# Patient Record
Sex: Female | Born: 1945 | Race: Black or African American | Hispanic: No | Smoking: Current every day smoker
Health system: Southern US, Community
[De-identification: ages and names within clinical notes are randomized; demographics above are authoritative.]

## PROBLEM LIST (undated history)

## (undated) DIAGNOSIS — Z720 Tobacco use: Secondary | ICD-10-CM

## (undated) DIAGNOSIS — F1010 Alcohol abuse, uncomplicated: Secondary | ICD-10-CM

## (undated) DIAGNOSIS — C50919 Malignant neoplasm of unspecified site of unspecified female breast: Secondary | ICD-10-CM

## (undated) DIAGNOSIS — I10 Essential (primary) hypertension: Secondary | ICD-10-CM

## (undated) DIAGNOSIS — J449 Chronic obstructive pulmonary disease, unspecified: Secondary | ICD-10-CM

## (undated) HISTORY — PX: MASTECTOMY: SHX3

---

## 1999-12-14 ENCOUNTER — Emergency Department (HOSPITAL_COMMUNITY): Admission: EM | Admit: 1999-12-14 | Discharge: 1999-12-14 | Payer: Self-pay | Admitting: Emergency Medicine

## 2002-04-09 ENCOUNTER — Emergency Department (HOSPITAL_COMMUNITY): Admission: EM | Admit: 2002-04-09 | Discharge: 2002-04-09 | Payer: Self-pay | Admitting: Emergency Medicine

## 2004-03-05 ENCOUNTER — Ambulatory Visit: Payer: Self-pay | Admitting: Family Medicine

## 2004-03-07 ENCOUNTER — Ambulatory Visit (HOSPITAL_COMMUNITY): Admission: RE | Admit: 2004-03-07 | Discharge: 2004-03-07 | Payer: Self-pay | Admitting: Sports Medicine

## 2004-03-19 ENCOUNTER — Ambulatory Visit: Payer: Self-pay | Admitting: Family Medicine

## 2004-06-11 ENCOUNTER — Encounter: Admission: RE | Admit: 2004-06-11 | Discharge: 2004-06-11 | Payer: Self-pay | Admitting: Gastroenterology

## 2004-06-24 ENCOUNTER — Encounter (INDEPENDENT_AMBULATORY_CARE_PROVIDER_SITE_OTHER): Payer: Self-pay | Admitting: Specialist

## 2004-06-24 ENCOUNTER — Ambulatory Visit (HOSPITAL_COMMUNITY): Admission: RE | Admit: 2004-06-24 | Discharge: 2004-06-24 | Payer: Self-pay | Admitting: Gastroenterology

## 2004-08-27 ENCOUNTER — Observation Stay (HOSPITAL_COMMUNITY): Admission: RE | Admit: 2004-08-27 | Discharge: 2004-08-28 | Payer: Self-pay | Admitting: General Surgery

## 2004-08-27 ENCOUNTER — Encounter (INDEPENDENT_AMBULATORY_CARE_PROVIDER_SITE_OTHER): Payer: Self-pay | Admitting: *Deleted

## 2004-08-28 ENCOUNTER — Emergency Department (HOSPITAL_COMMUNITY): Admission: EM | Admit: 2004-08-28 | Discharge: 2004-08-29 | Payer: Self-pay | Admitting: Emergency Medicine

## 2005-02-12 ENCOUNTER — Other Ambulatory Visit: Admission: RE | Admit: 2005-02-12 | Discharge: 2005-02-12 | Payer: Self-pay | Admitting: Radiology

## 2005-03-04 ENCOUNTER — Encounter (INDEPENDENT_AMBULATORY_CARE_PROVIDER_SITE_OTHER): Payer: Self-pay | Admitting: Specialist

## 2005-03-04 ENCOUNTER — Ambulatory Visit (HOSPITAL_COMMUNITY): Admission: RE | Admit: 2005-03-04 | Discharge: 2005-03-05 | Payer: Self-pay | Admitting: General Surgery

## 2005-03-11 ENCOUNTER — Ambulatory Visit: Payer: Self-pay | Admitting: Oncology

## 2005-04-03 ENCOUNTER — Ambulatory Visit (HOSPITAL_COMMUNITY): Admission: RE | Admit: 2005-04-03 | Discharge: 2005-04-03 | Payer: Self-pay | Admitting: Oncology

## 2005-05-21 ENCOUNTER — Ambulatory Visit: Payer: Self-pay | Admitting: Oncology

## 2005-07-14 ENCOUNTER — Ambulatory Visit: Payer: Self-pay | Admitting: Oncology

## 2005-08-25 LAB — CBC WITH DIFFERENTIAL/PLATELET
BASO%: 0.7 % (ref 0.0–2.0)
Basophils Absolute: 0.1 10*3/uL (ref 0.0–0.1)
EOS%: 0.6 % (ref 0.0–7.0)
HGB: 11.6 g/dL (ref 11.6–15.9)
MCH: 30.1 pg (ref 26.0–34.0)
MONO#: 1 10*3/uL — ABNORMAL HIGH (ref 0.1–0.9)
RDW: 16.9 % — ABNORMAL HIGH (ref 11.3–14.5)
WBC: 9.8 10*3/uL (ref 3.9–10.0)
lymph#: 3.3 10*3/uL (ref 0.9–3.3)

## 2005-08-25 LAB — COMPREHENSIVE METABOLIC PANEL
ALT: 12 U/L (ref 0–40)
BUN: 7 mg/dL (ref 6–23)
CO2: 24 mEq/L (ref 19–32)
Calcium: 9.2 mg/dL (ref 8.4–10.5)
Chloride: 102 mEq/L (ref 96–112)
Creatinine, Ser: 0.6 mg/dL (ref 0.4–1.2)
Glucose, Bld: 85 mg/dL (ref 70–99)

## 2005-08-25 LAB — CANCER ANTIGEN 27.29: CA 27.29: 34 U/mL (ref 0–39)

## 2005-08-31 ENCOUNTER — Ambulatory Visit: Payer: Self-pay | Admitting: Oncology

## 2005-09-01 LAB — CBC WITH DIFFERENTIAL/PLATELET
Basophils Absolute: 0.1 10*3/uL (ref 0.0–0.1)
Eosinophils Absolute: 0 10*3/uL (ref 0.0–0.5)
HGB: 11.2 g/dL — ABNORMAL LOW (ref 11.6–15.9)
MCV: 91.4 fL (ref 81.0–101.0)
NEUT#: 10.3 10*3/uL — ABNORMAL HIGH (ref 1.5–6.5)
RDW: 16.9 % — ABNORMAL HIGH (ref 11.3–14.5)
lymph#: 4.2 10*3/uL — ABNORMAL HIGH (ref 0.9–3.3)

## 2005-09-16 LAB — COMPREHENSIVE METABOLIC PANEL
ALT: 13 U/L (ref 0–40)
Albumin: 3.6 g/dL (ref 3.5–5.2)
CO2: 24 mEq/L (ref 19–32)
Calcium: 9.2 mg/dL (ref 8.4–10.5)
Chloride: 104 mEq/L (ref 96–112)
Glucose, Bld: 82 mg/dL (ref 70–99)
Potassium: 4 mEq/L (ref 3.5–5.3)
Sodium: 138 mEq/L (ref 135–145)
Total Bilirubin: 0.4 mg/dL (ref 0.3–1.2)
Total Protein: 8.1 g/dL (ref 6.0–8.3)

## 2005-09-16 LAB — CBC WITH DIFFERENTIAL/PLATELET
BASO%: 1.2 % (ref 0.0–2.0)
Eosinophils Absolute: 0.1 10*3/uL (ref 0.0–0.5)
LYMPH%: 38.2 % (ref 14.0–48.0)
MONO#: 0.6 10*3/uL (ref 0.1–0.9)
NEUT#: 3.2 10*3/uL (ref 1.5–6.5)
Platelets: 380 10*3/uL (ref 145–400)
RBC: 3.83 10*6/uL (ref 3.70–5.32)
WBC: 6.2 10*3/uL (ref 3.9–10.0)
lymph#: 2.4 10*3/uL (ref 0.9–3.3)

## 2005-09-16 LAB — CANCER ANTIGEN 27.29: CA 27.29: 30 U/mL (ref 0–39)

## 2005-09-22 LAB — CBC WITH DIFFERENTIAL/PLATELET
Basophils Absolute: 0 10*3/uL (ref 0.0–0.1)
Eosinophils Absolute: 0 10*3/uL (ref 0.0–0.5)
HCT: 34.5 % — ABNORMAL LOW (ref 34.8–46.6)
HGB: 11.4 g/dL — ABNORMAL LOW (ref 11.6–15.9)
MONO#: 0.4 10*3/uL (ref 0.1–0.9)
NEUT%: 73.3 % (ref 39.6–76.8)
Platelets: 327 10*3/uL (ref 145–400)
WBC: 13.6 10*3/uL — ABNORMAL HIGH (ref 3.9–10.0)
lymph#: 3.1 10*3/uL (ref 0.9–3.3)

## 2005-10-26 ENCOUNTER — Ambulatory Visit: Payer: Self-pay | Admitting: Oncology

## 2005-10-28 LAB — CBC WITH DIFFERENTIAL/PLATELET
Basophils Absolute: 0 10*3/uL (ref 0.0–0.1)
Eosinophils Absolute: 0.4 10*3/uL (ref 0.0–0.5)
HCT: 35.9 % (ref 34.8–46.6)
HGB: 11.6 g/dL (ref 11.6–15.9)
LYMPH%: 47.9 % (ref 14.0–48.0)
MCV: 91.2 fL (ref 81.0–101.0)
MONO%: 5.7 % (ref 0.0–13.0)
NEUT#: 2.6 10*3/uL (ref 1.5–6.5)
Platelets: 270 10*3/uL (ref 145–400)

## 2005-10-28 LAB — COMPREHENSIVE METABOLIC PANEL
Albumin: 3.7 g/dL (ref 3.5–5.2)
Alkaline Phosphatase: 297 U/L — ABNORMAL HIGH (ref 39–117)
BUN: 11 mg/dL (ref 6–23)
CO2: 23 mEq/L (ref 19–32)
Glucose, Bld: 81 mg/dL (ref 70–99)
Total Bilirubin: 0.4 mg/dL (ref 0.3–1.2)

## 2005-10-28 LAB — CANCER ANTIGEN 27.29: CA 27.29: 17 U/mL (ref 0–39)

## 2006-03-02 ENCOUNTER — Encounter: Payer: Self-pay | Admitting: Family Medicine

## 2006-04-26 ENCOUNTER — Ambulatory Visit: Payer: Self-pay | Admitting: Oncology

## 2006-04-28 LAB — COMPREHENSIVE METABOLIC PANEL
ALT: 13 U/L (ref 0–35)
AST: 21 U/L (ref 0–37)
CO2: 23 mEq/L (ref 19–32)
Creatinine, Ser: 0.6 mg/dL (ref 0.40–1.20)
Total Bilirubin: 0.6 mg/dL (ref 0.3–1.2)

## 2006-04-28 LAB — CBC WITH DIFFERENTIAL/PLATELET
BASO%: 1.1 % (ref 0.0–2.0)
EOS%: 4 % (ref 0.0–7.0)
HCT: 41.4 % (ref 34.8–46.6)
LYMPH%: 50.7 % — ABNORMAL HIGH (ref 14.0–48.0)
MCH: 34.6 pg — ABNORMAL HIGH (ref 26.0–34.0)
MCHC: 33.6 g/dL (ref 32.0–36.0)
NEUT%: 39.5 % — ABNORMAL LOW (ref 39.6–76.8)
Platelets: 295 10*3/uL (ref 145–400)

## 2006-04-28 LAB — CANCER ANTIGEN 27.29: CA 27.29: 14 U/mL (ref 0–39)

## 2006-07-15 DIAGNOSIS — E785 Hyperlipidemia, unspecified: Secondary | ICD-10-CM | POA: Insufficient documentation

## 2006-07-15 DIAGNOSIS — R638 Other symptoms and signs concerning food and fluid intake: Secondary | ICD-10-CM | POA: Insufficient documentation

## 2006-07-15 DIAGNOSIS — R112 Nausea with vomiting, unspecified: Secondary | ICD-10-CM

## 2006-07-15 DIAGNOSIS — I10 Essential (primary) hypertension: Secondary | ICD-10-CM | POA: Insufficient documentation

## 2006-07-15 DIAGNOSIS — K7460 Unspecified cirrhosis of liver: Secondary | ICD-10-CM | POA: Insufficient documentation

## 2006-07-26 ENCOUNTER — Ambulatory Visit: Payer: Self-pay | Admitting: Oncology

## 2006-07-28 LAB — CBC WITH DIFFERENTIAL/PLATELET
EOS%: 4.8 % (ref 0.0–7.0)
Eosinophils Absolute: 0.4 10*3/uL (ref 0.0–0.5)
LYMPH%: 41.5 % (ref 14.0–48.0)
MCH: 35.5 pg — ABNORMAL HIGH (ref 26.0–34.0)
MCV: 101.8 fL — ABNORMAL HIGH (ref 81.0–101.0)
MONO%: 5.2 % (ref 0.0–13.0)
NEUT#: 4.1 10*3/uL (ref 1.5–6.5)
Platelets: 311 10*3/uL (ref 145–400)
RBC: 4.22 10*6/uL (ref 3.70–5.32)
RDW: 13.7 % (ref 11.3–14.5)

## 2006-07-30 LAB — COMPREHENSIVE METABOLIC PANEL
AST: 20 U/L (ref 0–37)
Alkaline Phosphatase: 286 U/L — ABNORMAL HIGH (ref 39–117)
BUN: 8 mg/dL (ref 6–23)
Glucose, Bld: 85 mg/dL (ref 70–99)
Potassium: 4.6 mEq/L (ref 3.5–5.3)
Sodium: 137 mEq/L (ref 135–145)
Total Bilirubin: 0.3 mg/dL (ref 0.3–1.2)
Total Protein: 8.9 g/dL — ABNORMAL HIGH (ref 6.0–8.3)

## 2006-07-30 LAB — IMMUNOFIXATION ELECTROPHORESIS
IgG (Immunoglobin G), Serum: 2250 mg/dL — ABNORMAL HIGH (ref 694–1618)
IgM, Serum: 293 mg/dL — ABNORMAL HIGH (ref 60–263)
Total Protein, Serum Electrophoresis: 8.9 g/dL — ABNORMAL HIGH (ref 6.0–8.3)

## 2006-07-30 LAB — FOLATE: Folate: 9.7 ng/mL

## 2006-08-29 ENCOUNTER — Emergency Department (HOSPITAL_COMMUNITY): Admission: EM | Admit: 2006-08-29 | Discharge: 2006-08-30 | Payer: Self-pay | Admitting: Emergency Medicine

## 2006-08-31 ENCOUNTER — Emergency Department (HOSPITAL_COMMUNITY): Admission: EM | Admit: 2006-08-31 | Discharge: 2006-08-31 | Payer: Self-pay | Admitting: Emergency Medicine

## 2006-11-01 ENCOUNTER — Ambulatory Visit: Payer: Self-pay | Admitting: Oncology

## 2006-11-03 LAB — CBC WITH DIFFERENTIAL/PLATELET
BASO%: 0.5 % (ref 0.0–2.0)
EOS%: 3 % (ref 0.0–7.0)
MCH: 35.8 pg — ABNORMAL HIGH (ref 26.0–34.0)
MCV: 101.4 fL — ABNORMAL HIGH (ref 81.0–101.0)
MONO%: 4.5 % (ref 0.0–13.0)
RBC: 3.67 10*6/uL — ABNORMAL LOW (ref 3.70–5.32)
RDW: 13.4 % (ref 11.3–14.5)
lymph#: 4.6 10*3/uL — ABNORMAL HIGH (ref 0.9–3.3)

## 2006-11-03 LAB — COMPREHENSIVE METABOLIC PANEL
ALT: 11 U/L (ref 0–35)
AST: 21 U/L (ref 0–37)
CO2: 20 mEq/L (ref 19–32)
Creatinine, Ser: 0.72 mg/dL (ref 0.40–1.20)
Total Bilirubin: 0.3 mg/dL (ref 0.3–1.2)

## 2006-11-03 LAB — CANCER ANTIGEN 27.29: CA 27.29: 18 U/mL (ref 0–39)

## 2007-01-31 ENCOUNTER — Ambulatory Visit: Payer: Self-pay | Admitting: Oncology

## 2007-03-03 ENCOUNTER — Encounter: Payer: Self-pay | Admitting: Family Medicine

## 2007-03-11 ENCOUNTER — Ambulatory Visit: Payer: Self-pay | Admitting: Oncology

## 2007-03-15 ENCOUNTER — Encounter (INDEPENDENT_AMBULATORY_CARE_PROVIDER_SITE_OTHER): Payer: Self-pay | Admitting: Family Medicine

## 2007-03-15 LAB — CBC & DIFF AND RETIC
Basophils Absolute: 0.1 10*3/uL (ref 0.0–0.1)
EOS%: 7.4 % — ABNORMAL HIGH (ref 0.0–7.0)
Eosinophils Absolute: 0.5 10*3/uL (ref 0.0–0.5)
HCT: 42.5 % (ref 34.8–46.6)
HGB: 14.9 g/dL (ref 11.6–15.9)
IRF: 0.43 — ABNORMAL HIGH (ref 0.130–0.330)
MCH: 36.4 pg — ABNORMAL HIGH (ref 26.0–34.0)
NEUT%: 41.4 % (ref 39.6–76.8)
lymph#: 2.7 10*3/uL (ref 0.9–3.3)

## 2007-03-15 LAB — COMPREHENSIVE METABOLIC PANEL
ALT: 14 U/L (ref 0–35)
Albumin: 3.7 g/dL (ref 3.5–5.2)
CO2: 22 mEq/L (ref 19–32)
Calcium: 8.9 mg/dL (ref 8.4–10.5)
Chloride: 105 mEq/L (ref 96–112)
Creatinine, Ser: 0.79 mg/dL (ref 0.40–1.20)
Potassium: 4.2 mEq/L (ref 3.5–5.3)
Sodium: 139 mEq/L (ref 135–145)
Total Protein: 8.2 g/dL (ref 6.0–8.3)

## 2007-03-15 LAB — CANCER ANTIGEN 27.29: CA 27.29: 14 U/mL (ref 0–39)

## 2007-09-01 ENCOUNTER — Ambulatory Visit: Payer: Self-pay | Admitting: Oncology

## 2007-09-06 LAB — COMPREHENSIVE METABOLIC PANEL
ALT: 20 U/L (ref 0–35)
AST: 34 U/L (ref 0–37)
CO2: 22 mEq/L (ref 19–32)
Sodium: 134 mEq/L — ABNORMAL LOW (ref 135–145)
Total Bilirubin: 0.6 mg/dL (ref 0.3–1.2)
Total Protein: 9.3 g/dL — ABNORMAL HIGH (ref 6.0–8.3)

## 2007-09-06 LAB — CBC WITH DIFFERENTIAL/PLATELET
BASO%: 0.9 % (ref 0.0–2.0)
LYMPH%: 33.3 % (ref 14.0–48.0)
MCHC: 35.4 g/dL (ref 32.0–36.0)
MONO#: 0.2 10*3/uL (ref 0.1–0.9)
Platelets: 278 10*3/uL (ref 145–400)
RBC: 3.99 10*6/uL (ref 3.70–5.32)
WBC: 8.6 10*3/uL (ref 3.9–10.0)
lymph#: 2.9 10*3/uL (ref 0.9–3.3)

## 2007-09-07 LAB — CANCER ANTIGEN 27.29: CA 27.29: 19 U/mL (ref 0–39)

## 2007-09-08 ENCOUNTER — Ambulatory Visit (HOSPITAL_COMMUNITY): Admission: RE | Admit: 2007-09-08 | Discharge: 2007-09-08 | Payer: Self-pay | Admitting: Oncology

## 2007-09-13 ENCOUNTER — Encounter (INDEPENDENT_AMBULATORY_CARE_PROVIDER_SITE_OTHER): Payer: Self-pay | Admitting: Family Medicine

## 2007-09-13 LAB — COMPREHENSIVE METABOLIC PANEL
ALT: 16 U/L (ref 0–35)
Alkaline Phosphatase: 253 U/L — ABNORMAL HIGH (ref 39–117)
Sodium: 136 mEq/L (ref 135–145)
Total Bilirubin: 0.5 mg/dL (ref 0.3–1.2)
Total Protein: 7.7 g/dL (ref 6.0–8.3)

## 2007-09-13 LAB — CBC WITH DIFFERENTIAL/PLATELET
BASO%: 0.6 % (ref 0.0–2.0)
EOS%: 9.9 % — ABNORMAL HIGH (ref 0.0–7.0)
LYMPH%: 37.1 % (ref 14.0–48.0)
MCH: 36 pg — ABNORMAL HIGH (ref 26.0–34.0)
MCHC: 34.7 g/dL (ref 32.0–36.0)
MCV: 103.9 fL — ABNORMAL HIGH (ref 81.0–101.0)
MONO%: 5 % (ref 0.0–13.0)
NEUT#: 3.3 10*3/uL (ref 1.5–6.5)
RBC: 3.85 10*6/uL (ref 3.70–5.32)
RDW: 13.8 % (ref 11.3–14.5)

## 2007-09-15 LAB — ALKALINE PHOSPHATASE, ISOENZYMES
ALP, Heat Stable (Liver): 212 U/L
Alk Phos Bone Fract: 44 U/L
Alk Phos: 256 U/L — ABNORMAL HIGH (ref 39–117)

## 2007-09-15 LAB — IMMUNOFIXATION ELECTROPHORESIS: IgM, Serum: 229 mg/dL (ref 60–263)

## 2007-09-16 ENCOUNTER — Ambulatory Visit (HOSPITAL_COMMUNITY): Admission: RE | Admit: 2007-09-16 | Discharge: 2007-09-16 | Payer: Self-pay | Admitting: Oncology

## 2008-03-07 ENCOUNTER — Ambulatory Visit: Payer: Self-pay | Admitting: Oncology

## 2008-03-09 LAB — COMPREHENSIVE METABOLIC PANEL
Albumin: 3.9 g/dL (ref 3.5–5.2)
CO2: 20 mEq/L (ref 19–32)
Chloride: 102 mEq/L (ref 96–112)
Glucose, Bld: 98 mg/dL (ref 70–99)
Potassium: 4.4 mEq/L (ref 3.5–5.3)
Sodium: 135 mEq/L (ref 135–145)
Total Protein: 8.4 g/dL — ABNORMAL HIGH (ref 6.0–8.3)

## 2008-03-09 LAB — CBC WITH DIFFERENTIAL/PLATELET
Eosinophils Absolute: 0.4 10*3/uL (ref 0.0–0.5)
MONO#: 0.4 10*3/uL (ref 0.1–0.9)
NEUT#: 3.4 10*3/uL (ref 1.5–6.5)
Platelets: 253 10*3/uL (ref 145–400)
RBC: 4.11 10*6/uL (ref 3.70–5.32)
RDW: 13.3 % (ref 11.3–14.5)
WBC: 7.2 10*3/uL (ref 3.9–10.0)

## 2008-03-09 LAB — CANCER ANTIGEN 27.29: CA 27.29: 20 U/mL (ref 0–39)

## 2008-03-15 ENCOUNTER — Encounter: Payer: Self-pay | Admitting: Family Medicine

## 2008-06-28 ENCOUNTER — Inpatient Hospital Stay (HOSPITAL_COMMUNITY): Admission: EM | Admit: 2008-06-28 | Discharge: 2008-07-01 | Payer: Self-pay | Admitting: Emergency Medicine

## 2008-06-28 ENCOUNTER — Ambulatory Visit: Payer: Self-pay | Admitting: Cardiology

## 2008-06-29 ENCOUNTER — Encounter (INDEPENDENT_AMBULATORY_CARE_PROVIDER_SITE_OTHER): Payer: Self-pay | Admitting: Internal Medicine

## 2009-02-22 ENCOUNTER — Ambulatory Visit: Payer: Self-pay | Admitting: Oncology

## 2009-02-26 LAB — CBC WITH DIFFERENTIAL/PLATELET
Basophils Absolute: 0 10*3/uL (ref 0.0–0.1)
Eosinophils Absolute: 0.6 10*3/uL — ABNORMAL HIGH (ref 0.0–0.5)
HCT: 44.6 % (ref 34.8–46.6)
HGB: 15.3 g/dL (ref 11.6–15.9)
MCV: 107.7 fL — ABNORMAL HIGH (ref 79.5–101.0)
MONO%: 5.6 % (ref 0.0–14.0)
NEUT#: 3.5 10*3/uL (ref 1.5–6.5)
NEUT%: 50.9 % (ref 38.4–76.8)
Platelets: 240 10*3/uL (ref 145–400)
RDW: 13.2 % (ref 11.2–14.5)

## 2009-02-26 LAB — COMPREHENSIVE METABOLIC PANEL
Albumin: 3.6 g/dL (ref 3.5–5.2)
Alkaline Phosphatase: 339 U/L — ABNORMAL HIGH (ref 39–117)
BUN: 5 mg/dL — ABNORMAL LOW (ref 6–23)
Calcium: 9 mg/dL (ref 8.4–10.5)
Glucose, Bld: 108 mg/dL — ABNORMAL HIGH (ref 70–99)
Potassium: 4.3 mEq/L (ref 3.5–5.3)

## 2010-03-25 ENCOUNTER — Encounter: Payer: Self-pay | Admitting: Family Medicine

## 2010-04-11 ENCOUNTER — Emergency Department (HOSPITAL_COMMUNITY)
Admission: EM | Admit: 2010-04-11 | Discharge: 2010-04-11 | Payer: Self-pay | Source: Home / Self Care | Admitting: Emergency Medicine

## 2010-04-29 ENCOUNTER — Ambulatory Visit: Payer: Self-pay | Admitting: Internal Medicine

## 2010-05-06 ENCOUNTER — Ambulatory Visit: Payer: Self-pay | Admitting: Internal Medicine

## 2010-06-07 ENCOUNTER — Encounter: Payer: Self-pay | Admitting: Sports Medicine

## 2010-06-17 NOTE — Miscellaneous (Signed)
Summary: mammogram update   Clinical Lists Changes  Observations: Added new observation of MAMMO DUE: 03/2011 (03/25/2010 12:26) Added new observation of MAMMOGRAM: normal (03/24/2010 12:26)      Preventive Care Screening  Mammogram:    Date:  03/24/2010    Next Due:  03/2011    Results:  normal

## 2010-07-29 LAB — DIFFERENTIAL
Basophils Absolute: 0 10*3/uL (ref 0.0–0.1)
Basophils Relative: 1 % (ref 0–1)
Eosinophils Absolute: 0.7 10*3/uL (ref 0.0–0.7)
Eosinophils Relative: 8 % — ABNORMAL HIGH (ref 0–5)
Lymphocytes Relative: 34 % (ref 12–46)
Lymphs Abs: 2.8 10*3/uL (ref 0.7–4.0)
Monocytes Absolute: 0.8 10*3/uL (ref 0.1–1.0)
Monocytes Relative: 10 % (ref 3–12)
Neutro Abs: 3.8 10*3/uL (ref 1.7–7.7)
Neutrophils Relative %: 47 % (ref 43–77)

## 2010-07-29 LAB — POCT CARDIAC MARKERS
CKMB, poc: 1.1 ng/mL (ref 1.0–8.0)
CKMB, poc: 1.6 ng/mL (ref 1.0–8.0)
Myoglobin, poc: 61.7 ng/mL (ref 12–200)
Troponin i, poc: <0.05 ng/mL (ref 0.00–0.09)
Troponin i, poc: <0.05 ng/mL (ref 0.00–0.09)

## 2010-07-29 LAB — CBC
HCT: 46.3 % — ABNORMAL HIGH (ref 36.0–46.0)
Hemoglobin: 16.2 g/dL — ABNORMAL HIGH (ref 12.0–15.0)
MCH: 36.9 pg — ABNORMAL HIGH (ref 26.0–34.0)
MCHC: 35 g/dL (ref 30.0–36.0)
MCV: 105.5 fL — ABNORMAL HIGH (ref 78.0–100.0)
Platelets: 272 10*3/uL (ref 150–400)
RBC: 4.39 MIL/uL (ref 3.87–5.11)
RDW: 12.7 % (ref 11.5–15.5)
WBC: 8.1 10*3/uL (ref 4.0–10.5)

## 2010-07-29 LAB — BASIC METABOLIC PANEL
BUN: 4 mg/dL — ABNORMAL LOW (ref 6–23)
CO2: 17 mEq/L — ABNORMAL LOW (ref 19–32)
Calcium: 8.8 mg/dL (ref 8.4–10.5)
Chloride: 108 mEq/L (ref 96–112)
Creatinine, Ser: 0.69 mg/dL (ref 0.4–1.2)
GFR calc Af Amer: >60 mL/min (ref >60)
GFR calc non Af Amer: >60 mL/min (ref >60)
Glucose, Bld: 91 mg/dL (ref 70–99)
Potassium: 3.9 mEq/L (ref 3.5–5.1)
Sodium: 137 mEq/L (ref 135–145)

## 2010-07-29 LAB — POCT I-STAT 3, ART BLOOD GAS (G3+)
Acid-base deficit: 6 mmol/L — ABNORMAL HIGH (ref 0.0–2.0)
Bicarbonate: 18.3 mEq/L — ABNORMAL LOW (ref 20.0–24.0)
pCO2 arterial: 31.5 mmHg — ABNORMAL LOW (ref 35.0–45.0)
pH, Arterial: 7.374 (ref 7.350–7.400)
pO2, Arterial: 80 mmHg (ref 80.0–100.0)

## 2010-07-29 LAB — BRAIN NATRIURETIC PEPTIDE: Pro B Natriuretic peptide (BNP): <30 pg/mL (ref 0.0–100.0)

## 2010-09-02 LAB — MAGNESIUM: Magnesium: 1.9 mg/dL (ref 1.5–2.5)

## 2010-09-02 LAB — CBC
HCT: 37.7 % (ref 36.0–46.0)
HCT: 44.4 % (ref 36.0–46.0)
MCHC: 35 g/dL (ref 30.0–36.0)
MCV: 103.9 fL — ABNORMAL HIGH (ref 78.0–100.0)
MCV: 105.9 fL — ABNORMAL HIGH (ref 78.0–100.0)
Platelets: 202 10*3/uL (ref 150–400)
Platelets: 213 10*3/uL (ref 150–400)
RBC: 4.27 MIL/uL (ref 3.87–5.11)
WBC: 4.2 10*3/uL (ref 4.0–10.5)
WBC: 4.8 10*3/uL (ref 4.0–10.5)

## 2010-09-02 LAB — BASIC METABOLIC PANEL
BUN: 2 mg/dL — ABNORMAL LOW (ref 6–23)
BUN: 5 mg/dL — ABNORMAL LOW (ref 6–23)
Calcium: 8.7 mg/dL (ref 8.4–10.5)
Chloride: 101 mEq/L (ref 96–112)
Chloride: 101 mEq/L (ref 96–112)
Chloride: 103 mEq/L (ref 96–112)
Creatinine, Ser: 0.56 mg/dL (ref 0.4–1.2)
Creatinine, Ser: 0.71 mg/dL (ref 0.4–1.2)
GFR calc Af Amer: >60 mL/min (ref >60)
GFR calc non Af Amer: >60 mL/min (ref >60)
GFR calc non Af Amer: >60 mL/min (ref >60)
Potassium: 3.6 mEq/L (ref 3.5–5.1)
Potassium: 4.2 mEq/L (ref 3.5–5.1)
Sodium: 134 mEq/L — ABNORMAL LOW (ref 135–145)

## 2010-09-02 LAB — GLUCOSE, CAPILLARY
Glucose-Capillary: 119 mg/dL — ABNORMAL HIGH (ref 70–99)
Glucose-Capillary: 123 mg/dL — ABNORMAL HIGH (ref 70–99)
Glucose-Capillary: 127 mg/dL — ABNORMAL HIGH (ref 70–99)
Glucose-Capillary: 128 mg/dL — ABNORMAL HIGH (ref 70–99)
Glucose-Capillary: 132 mg/dL — ABNORMAL HIGH (ref 70–99)
Glucose-Capillary: 145 mg/dL — ABNORMAL HIGH (ref 70–99)
Glucose-Capillary: 158 mg/dL — ABNORMAL HIGH (ref 70–99)

## 2010-09-02 LAB — URINE MICROSCOPIC-ADD ON

## 2010-09-02 LAB — DIFFERENTIAL
Lymphocytes Relative: 45 % (ref 12–46)
Lymphs Abs: 2.1 10*3/uL (ref 0.7–4.0)
Monocytes Relative: 8 % (ref 3–12)
Neutrophils Relative %: 40 % — ABNORMAL LOW (ref 43–77)

## 2010-09-02 LAB — CULTURE, BLOOD (ROUTINE X 2)
Culture: NO GROWTH
Culture: NO GROWTH

## 2010-09-02 LAB — HEMOGLOBIN A1C
Hgb A1c MFr Bld: 5.5 % (ref 4.6–6.1)
Mean Plasma Glucose: 111 mg/dL

## 2010-09-02 LAB — SODIUM, URINE, RANDOM: Sodium, Ur: <10 mEq/L

## 2010-09-02 LAB — URINALYSIS, ROUTINE W REFLEX MICROSCOPIC
Glucose, UA: NEGATIVE mg/dL
Nitrite: NEGATIVE
Specific Gravity, Urine: 1.01 (ref 1.005–1.030)
pH: 5 (ref 5.0–8.0)

## 2010-09-02 LAB — PHOSPHORUS: Phosphorus: 3.3 mg/dL (ref 2.3–4.6)

## 2010-09-02 LAB — CARDIAC PANEL(CRET KIN+CKTOT+MB+TROPI): Troponin I: <0.01 ng/mL (ref 0.00–0.06)

## 2010-09-02 LAB — OSMOLALITY, URINE: Osmolality, Ur: 178 mOsm/kg — ABNORMAL LOW (ref 390–1090)

## 2010-09-02 LAB — CK TOTAL AND CKMB (NOT AT ARMC): Relative Index: INVALID (ref 0.0–2.5)

## 2010-09-02 LAB — LIPID PANEL
HDL: 71 mg/dL (ref >39)
Total CHOL/HDL Ratio: 2.8 RATIO

## 2010-09-02 LAB — BRAIN NATRIURETIC PEPTIDE: Pro B Natriuretic peptide (BNP): 40 pg/mL (ref 0.0–100.0)

## 2010-09-02 LAB — TSH: TSH: 0.689 u[IU]/mL (ref 0.350–4.500)

## 2010-09-30 NOTE — H&P (Signed)
NAMEKENDAHL, Kayla Tyler                  ACCOUNT NO.:  [id number]   MEDICAL RECORD NO.:  [id number]          PATIENT TYPE:  EMS   LOCATION:  MAJO                         FACILITY:  [facility]   PHYSICIAN:  Eduard Clos, MDDATE OF BIRTH:  1945/07/18   DATE OF ADMISSION:  06/28/2008  DATE OF DISCHARGE:                              HISTORY & PHYSICAL   PRIMARY CARE PHYSICIAN:  Dr. Lorelle Formosa.   CHIEF COMPLAINT:  Shortness of breath.   HISTORY OF PRESENT ILLNESS:  A 65 year old female with a history of CA  of the breast, status post left-sided mastectomy 2 years ago is being  followed by Dr. Tawanna Cooler of [facility] and came in with  complaint of shortness of breath.   The patient has been having shortness of breath over the last 2 days  which started off insidiously.  The patient's shortness of breath is not  related to exertion, does experience at rest.  She denies any chest  pain.  She has a cough with some productive sputum.  She denies any  fever or chills.  She denies any palpitations, dizziness, loss of  consciousness, weakness of limbs, abdominal pain, nausea, vomiting,  diarrhea, dysuria or discharges.  The patient has been admitted for  further workup for possible COPD exacerbation.  In addition, the patient  has also been found to be sinus tachycardic, although the patient does  not complain of any palpitation.   PAST MEDICAL HISTORY:  History of CA of the breast, status post left-  sided mastectomy 2 years ago.   PAST SURGICAL HISTORY:  1. CA of the breast, status post left-sided mastectomy.  2. Hysterectomy.  3. Cholecystectomy.   MEDICATIONS PRIOR TO ADMISSION:  None.   SOCIAL HISTORY:  The patient lives with her son.  Smokes cigarettes, has  been advised to quit smoking.  Drinks 2 cans of beer everyday.  She has  been advised to quit drinking alcohol.  Denies any drug abuse.   FAMILY HISTORY:  Nothing contributory.   REVIEW OF SYSTEMS:  As per  history of present illness, nothing else  significant.   PHYSICAL EXAMINATION:  GENERAL:  Patient examined at bedside, not in  acute distress.  VITAL SIGNS:  Blood pressure is 140/88, pulse 118 per minute,  temperature 97.9, respirations 18 per minute.  O2 sat 98%.  HEENT:  Anicteric.  No pallor.  CHEST:  Bilateral air entry present.  There is an expiratory wheeze  bilaterally.  No crepitation.  HEART:  S1-S2 heard.  ABDOMEN:  Soft, nontender.  Bowel sounds heard.  CNS:  She is awake and oriented to time, place and person.  Moves upper  and lower extremities 5/5.  EXTREMITIES:  Peripheral pulses felt.  No edema.   LABORATORY DATA:  Chest x-ray shows COPD and bronchitic changes.  No  evidence for acute cardiopulmonary disease.  Postoperative changes.  EKG  shows sinus tachycardia with nonspecific ST-T wave changes with a heart  rate around 106 beats per minute.  CBC - WBC 4.8, hemoglobin 15.7,  hematocrit 44.4, MCV 103, platelets 213,  neutrophils 40%, lymphocytes  45%, eosinophils 7%.  Basic metabolic panel; sodium 131, potassium 4.2,  chloride 101, carbon dioxide 18, glucose 96, BUN 2, creatinine 0.5,  calcium 9, CK-MB 2.3, troponin-I less than 0.05.  BNP 40.  UA shows  nitrites negative, small lymphocytes, WBCs 0, bacteria rare.   ASSESSMENT:  1. Possible chronic obstructive pulmonary disease exacerbation.  2. Sinus tachycardia.  3. Mild metabolic acidosis.  4. Alcoholism.  5. Tobacco abuse.   PLAN:  Will admit patient to telemetry.  We will put the patient on  steroids, bronchodilators and will also get a CT of the chest to rule  out PE.  Will repeat a CBC in a.m.  The patient has predominantly  lymphocytosis.  We will have to observe this again and repeat CBC.  We  will hydrate mildly with normal saline.  Get a 2-D echo.  Further  recommendations as condition evolves.      Eduard Clos, MD  Electronically Signed     ANK/MEDQ  D:  06/28/2008  T:  06/28/2008   Job:  [id number]

## 2010-10-03 NOTE — Op Note (Signed)
Kayla Tyler, Kayla Tyler                  ACCOUNT NO.:  [id number]   MEDICAL RECORD NO.:  [id number]          PATIENT TYPE:  OIB   LOCATION:  5731                         FACILITY:  [facility]   PHYSICIAN:  Ollen Gross. Vernell Morgans, M.D. DATE OF BIRTH:  1946/03/24   DATE OF PROCEDURE:  03/04/2005  DATE OF DISCHARGE:  03/05/2005                                 OPERATIVE REPORT   PREOPERATIVE DIAGNOSIS:  Left breast cancer.   POSTOPERATIVE DIAGNOSIS:  Left breast cancer.   PROCEDURES:  1.  Left sentinel node biopsy with injection of blue dye.  2.  Left modified radical mastectomy   SURGEON:  Ollen Gross. Carolynne Edouard, M.D.   ASSISTANT:  Anselm Pancoast. Zachery Dakins, M.D.   ANESTHESIA:  General endotracheal.   PROCEDURE:  After informed consent was obtained, the patient brought to the  operating room and placed in a supine position on the operating room table.  After adequate induction of general anesthesia, the patient's left breast,  chest and axilla were prepped with Betadine and draped in the usual sterile  manner.  Five milliliters of methylene blue and injectable saline were then  infiltrated subdermally in the subareolar area.  The breast was massaged for  several minutes.  A NeoProbe 2000 device was then used to measure the  radioactivity.  The primary site of injection of the radionuclear tracer  measured approximately 4000.  A single hot spot was identified in the left  axilla.  A small transverse incision was made in the left axilla with a 15  blade knife overlying this hot spot.  The incision was then carried down  through the skin and subcutaneous tissue into the axilla sharply with the  electrocautery.  Blunt dissection was then carried down into this area with  a hemostat until a blue lymph node was identified; this lymph node was also  hot.  The lymph node was excised sharply with the electrocautery as well as  with some blunt dissection.  The afferent and efferent lymphatics were  clamped with  hemostats, divided and ligated with 3-0 Vicryl ties.  The ex  vivo counts on this lymph node were approximately 240 and this was sent as  sentinel node #1.  Two other smaller palpable lymph nodes in this area were  also removed sharply with the electrocautery.  No other radioactive activity  was identified in the left axilla.  The deep layer of the incision was then  closed with interrupted 3-0 Vicryl stitches and skin was closed with a  running 4-0 Monocryl subcuticular stitch.  At this point, the pathologist  called back and said that sentinel node #1 did have cancer cells in it.  At  this point, attention was turned to the left breast and an elliptical  incision was made superior transversely along the breast to incorporate the  nipple-areolar complex in such a way that the skin flaps would close  appropriately; these incisions were made with a 10 blade knife.  The skin  was incised with the Bovie electrocautery and thin subcutaneous flaps were  created both superiorly and inferiorly,  sharply with the electrocautery;  this was done using skin hooks to elevate the skin towards the ceiling.  This subcutaneous flap was carried superiorly and inferiorly down to the  chest wall and laterally into the axilla.  Once this was accomplished, the  breast was then removed from the pectoralis chest wall muscle with the  fascia; this was done sharply with the electrocautery and gentle traction.  Once this dissection was completed, the breast was removed from the chest  wall and the axilla was entered.  The latissimus muscle was identified  laterally, chest wall medially with the thoracodorsal and long thoracic  nerves and the axillary vein superiorly.  Once these landmarks were  identified, the lymphatic tissue within these boundaries was excised by a  combination of blunt dissection with the right angle and sharp dissection  with the Metzenbaum scissors and electrocautery.  A couple of small veins   were controlled with vascular clips and divided with the Metzenbaum  scissors.  Once this was accomplished and the nerves were identified and the  dissection was kept away from the nerves, then the contents of the axilla  were removed en bloc with the breast specimen.  The breast was oriented with  a short stitch being superior and long stitch being lateral, and specimen  was sent to Pathology for further evaluation.  The wound was then examined  and found be hemostatic.  The wound was irrigated with copious amounts of  saline and 2 small stab incisions were made inferior to the incision with a  15 blade knife.  Hemostats were placed through the stab incisions and 19-  [place] round Blake drains were brought through these incisions.  One medial  drain was placed on the chest wall; the lateral drain was placed in the  axilla.  Both drains were cut to fit.  The drains were anchored to the skin  with interrupted 3-0 nylon stitches.  The incision was then closed with skin  staples and the drains were placed to suction.  Sterile dressings were then  applied.  The patient tolerated the procedure well.  At the end of the case,  all needle, sponge and instrument counts were correct and the patient was  then awakened and taken to recovery room in stable condition.      Ollen Gross. Vernell Morgans, M.D.  Electronically Signed     PST/MEDQ  D:  03/08/2005  T:  03/09/2005  Job:  [id number]

## 2010-10-03 NOTE — Discharge Summary (Signed)
Kayla Tyler, Kayla Tyler                  ACCOUNT NO.:  [id number]   MEDICAL RECORD NO.:  [id number]          PATIENT TYPE:  INP   LOCATION:  4715                         FACILITY:  [facility]   PHYSICIAN:  Altha Harm, MDDATE OF BIRTH:  February 17, 1946   DATE OF ADMISSION:  06/28/2008  DATE OF DISCHARGE:  07/01/2008                               DISCHARGE SUMMARY   DISCHARGE DISPOSITION:  Home.   FINAL DISCHARGE DIAGNOSES:  1. Acute exacerbation of chronic obstructive pulmonary disease,      resolving.  2  Hypertension.  1. Alcohol use.  2. Alcohol withdrawal, resolved.  3. Tachycardia, resolved.  4. Hyperlipidemia.  5. 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   DISCHARGE MEDICATIONS:  1. Prednisone tapered as follows:  50 mg daily times one, then 40 mg      daily times one, then 30 mg daily times one, and 20 mg daily times      one, then 10 mg daily times one then stop.  2. Hydrochlorothiazide 12.5 mg p.o. daily.  3. Albuterol MDI two puffs inhaled q.4 h. p.r.n.  4. Atrovent MDI two puffs inhaled q.8 h. p.r.n..   CONSULTANTS:  None.   PROCEDURES:  None.   DIAGNOSTIC STUDIES:  1. A 2-D echocardiogram done on June 29, 2008 which showed      ejection fraction preserved at 60%.  No left ventricular regional      wall motion abnormalities, mild focal basal septal hypertrophy, and      mild diastolic dysfunction.  There is mild pulmonary hypertension.      The right ventricle appeared borderline enlarged, with normal      systolic function.  2. CT angiogram of the chest done on June 29, 2008 which shows no      evidence of acute pulmonary emboli.  Stable aneurysmal dilatation      of the ascending aorta and coronary artery disease.  Diffuse      central airwave thickening, compatible with bronchitis.  No      evidence of pneumonia.  3. A 2-view chest x-ray done on admission that shows chronic      obstructive pulmonary disease with bronchitic changes.  No  evidence      for acute cardiopulmonary disease, and postoperative changes.   PRIMARY CARE PHYSICIAN:  Lorelle Formosa, M.D.   ALLERGIES:  PENICILLIN.   CODE STATUS:  Full code.   CHIEF COMPLAINT:  Shortness of breath.   HISTORY OF PRESENT ILLNESS:  Please refer to the history and physical  dictated by Dr. Toniann Fail for details of the HPI.   HOSPITAL COURSE:  1. The patient was admitted with a presumptive diagnosis of acute      exacerbation of COPD.  The patient was started on IV Solu-Medrol      and also on Avelox for treatment of atypical pathogens.  The      patient was transitioned from Solu-Medrol to prednisone, for which      she tolerated well.  The Avelox was continued for the duration of      her  stay in the hospital; however, she was not discharged home on      any antibiotics.  The patient was without any need for oxygen      supplementation at the time of her discharge.  2. Hypertension.  The patient was clearly hypertensive while      hospitalized.  She was started on hydrochlorothiazide.  The patient      reported that she had been tried on several antihypertensive      medications in the past, and had multiple episodes of hypotension      causing syncope.  Thus, she had been discontinued on any      antihypertensive medications, and did not want to start on any      medications at this time.  The only medication she was willing to      try was the hydrochlorothiazide, that she had here  in the hospital      and would continue on at home.  3. Hyperlipidemia.  The patient was noted to have a cholesterol LDL of      120.  This was discussed with the patient, and she did not want to      start on any medications for that, stating that she wanted to try a      diet first.  She was to follow up with Dr. Ronne Binning for any further      management of her hyperlipidemia.  4. Tachycardia.  The patient was tachycardic during her      hospitalization.  It was felt to be  secondary to her alcohol      withdrawal.  The patient was placed on a Librium protocol and the      tachycardia resolved.  5. Tobacco use disorder.  The patient was counseled against further      tobacco use and given material on tobacco cessation.  6. Alcohol dependence.  The patient again was counseled about this,      and was in a contemplated state stating that she would stop alcohol      use.  The patient was offered information on an outpatient alcohol      rehabilitation program, but stated that she felt she could do it by      herself.  The patient reports that she will follow up with AA for      her alcohol dependence.   Otherwise, the patient remained stable.  The patient does have a known  aneurysmal dilatation, which remained stable in the hospital.  No  further intervention was needed at this time.   DIETARY RESTRICTIONS:  The patient should be on a low-sodium, low-  cholesterol diet.   PHYSICAL RESTRICTIONS:  None.   FOLLOWUP:  The patient is to follow up with Dr. Ronne Binning in 3-5 days.   TOTAL TIME FOR DISCHARGE:  40 minutes.      Altha Harm, MD  Electronically Signed     MAM/MEDQ  D:  08/23/2008  T:  08/23/2008  Job:  [id number]

## 2011-02-02 ENCOUNTER — Emergency Department (HOSPITAL_COMMUNITY)
Admission: EM | Admit: 2011-02-02 | Discharge: 2011-02-02 | Disposition: A | Discharge status: Home / Self Care | Payer: PRIVATE HEALTH INSURANCE | Attending: Emergency Medicine | Admitting: Emergency Medicine

## 2011-02-02 ENCOUNTER — Emergency Department (HOSPITAL_COMMUNITY): Payer: PRIVATE HEALTH INSURANCE

## 2011-02-02 DIAGNOSIS — J45909 Unspecified asthma, uncomplicated: Secondary | ICD-10-CM | POA: Insufficient documentation

## 2011-02-02 DIAGNOSIS — I10 Essential (primary) hypertension: Secondary | ICD-10-CM | POA: Insufficient documentation

## 2011-02-02 DIAGNOSIS — J40 Bronchitis, not specified as acute or chronic: Secondary | ICD-10-CM | POA: Insufficient documentation

## 2013-04-30 ENCOUNTER — Inpatient Hospital Stay (HOSPITAL_COMMUNITY)
Admission: EM | Admit: 2013-04-30 | Discharge: 2013-05-02 | DRG: 191 | Disposition: A | Discharge status: Home / Self Care | Payer: PRIVATE HEALTH INSURANCE | Attending: Internal Medicine | Admitting: Internal Medicine

## 2013-04-30 ENCOUNTER — Encounter (HOSPITAL_COMMUNITY): Payer: Self-pay | Admitting: Emergency Medicine

## 2013-04-30 ENCOUNTER — Emergency Department (HOSPITAL_COMMUNITY): Payer: PRIVATE HEALTH INSURANCE

## 2013-04-30 DIAGNOSIS — E871 Hypo-osmolality and hyponatremia: Secondary | ICD-10-CM | POA: Diagnosis present

## 2013-04-30 DIAGNOSIS — E785 Hyperlipidemia, unspecified: Secondary | ICD-10-CM | POA: Diagnosis present

## 2013-04-30 DIAGNOSIS — D7589 Other specified diseases of blood and blood-forming organs: Secondary | ICD-10-CM | POA: Diagnosis present

## 2013-04-30 DIAGNOSIS — I498 Other specified cardiac arrhythmias: Secondary | ICD-10-CM

## 2013-04-30 DIAGNOSIS — R0602 Shortness of breath: Secondary | ICD-10-CM

## 2013-04-30 DIAGNOSIS — F17210 Nicotine dependence, cigarettes, uncomplicated: Secondary | ICD-10-CM

## 2013-04-30 DIAGNOSIS — Z9010 Acquired absence of unspecified breast and nipple: Secondary | ICD-10-CM

## 2013-04-30 DIAGNOSIS — Z9221 Personal history of antineoplastic chemotherapy: Secondary | ICD-10-CM

## 2013-04-30 DIAGNOSIS — F17200 Nicotine dependence, unspecified, uncomplicated: Secondary | ICD-10-CM | POA: Diagnosis present

## 2013-04-30 DIAGNOSIS — I10 Essential (primary) hypertension: Secondary | ICD-10-CM | POA: Diagnosis present

## 2013-04-30 DIAGNOSIS — K7460 Unspecified cirrhosis of liver: Secondary | ICD-10-CM | POA: Diagnosis present

## 2013-04-30 DIAGNOSIS — F1010 Alcohol abuse, uncomplicated: Secondary | ICD-10-CM

## 2013-04-30 DIAGNOSIS — Z825 Family history of asthma and other chronic lower respiratory diseases: Secondary | ICD-10-CM

## 2013-04-30 DIAGNOSIS — J449 Chronic obstructive pulmonary disease, unspecified: Secondary | ICD-10-CM

## 2013-04-30 DIAGNOSIS — J441 Chronic obstructive pulmonary disease with (acute) exacerbation: Principal | ICD-10-CM

## 2013-04-30 DIAGNOSIS — Z23 Encounter for immunization: Secondary | ICD-10-CM

## 2013-04-30 DIAGNOSIS — J4489 Other specified chronic obstructive pulmonary disease: Secondary | ICD-10-CM

## 2013-04-30 DIAGNOSIS — Z833 Family history of diabetes mellitus: Secondary | ICD-10-CM

## 2013-04-30 DIAGNOSIS — Z8249 Family history of ischemic heart disease and other diseases of the circulatory system: Secondary | ICD-10-CM

## 2013-04-30 DIAGNOSIS — Z853 Personal history of malignant neoplasm of breast: Secondary | ICD-10-CM

## 2013-04-30 DIAGNOSIS — Z880 Allergy status to penicillin: Secondary | ICD-10-CM

## 2013-04-30 HISTORY — DX: Malignant neoplasm of unspecified site of unspecified female breast: C50.919

## 2013-04-30 LAB — TROPONIN I
Troponin I: <0.3 ng/mL (ref <0.30)
Troponin I: <0.3 ng/mL (ref <0.30)

## 2013-04-30 LAB — BASIC METABOLIC PANEL
BUN: 3 mg/dL — ABNORMAL LOW (ref 6–23)
Chloride: 89 mEq/L — ABNORMAL LOW (ref 96–112)
Glucose, Bld: 119 mg/dL — ABNORMAL HIGH (ref 70–99)
Potassium: 3.9 mEq/L (ref 3.5–5.1)
Sodium: 126 mEq/L — ABNORMAL LOW (ref 135–145)

## 2013-04-30 LAB — CREATININE, SERUM
GFR calc Af Amer: >90 mL/min (ref >90)
GFR calc non Af Amer: >90 mL/min (ref >90)

## 2013-04-30 LAB — CBC WITH DIFFERENTIAL/PLATELET
Hemoglobin: 14.8 g/dL (ref 12.0–15.0)
Lymphocytes Relative: 8 % — ABNORMAL LOW (ref 12–46)
Lymphs Abs: 0.4 10*3/uL — ABNORMAL LOW (ref 0.7–4.0)
Monocytes Relative: 8 % (ref 3–12)
Neutro Abs: 4.8 10*3/uL (ref 1.7–7.7)
Neutrophils Relative %: 85 % — ABNORMAL HIGH (ref 43–77)
RBC: 4.06 MIL/uL (ref 3.87–5.11)
WBC: 5.7 10*3/uL (ref 4.0–10.5)

## 2013-04-30 LAB — CBC
HCT: 40 % (ref 36.0–46.0)
Hemoglobin: 14.2 g/dL (ref 12.0–15.0)
MCH: 37.2 pg — ABNORMAL HIGH (ref 26.0–34.0)
MCHC: 35.5 g/dL (ref 30.0–36.0)
MCV: 104.7 fL — ABNORMAL HIGH (ref 78.0–100.0)
RDW: 12.8 % (ref 11.5–15.5)
WBC: 5.5 10*3/uL (ref 4.0–10.5)

## 2013-04-30 LAB — PHOSPHORUS: Phosphorus: 2.9 mg/dL (ref 2.3–4.6)

## 2013-04-30 MED ORDER — IPRATROPIUM BROMIDE 0.02 % IN SOLN
0.5000 mg | Freq: Four times a day (QID) | RESPIRATORY_TRACT | Status: DC
  Administered 2013-05-01 – 2013-05-02 (×6): 0.5 mg via RESPIRATORY_TRACT
  Filled 2013-04-30 (×7): qty 2.5

## 2013-04-30 MED ORDER — LEVALBUTEROL HCL 0.63 MG/3ML IN NEBU: 0.6300 mg | INHALATION_SOLUTION | Freq: Four times a day (QID) | RESPIRATORY_TRACT | Status: DC

## 2013-04-30 MED ORDER — FOLIC ACID 1 MG PO TABS
1.0000 mg | ORAL_TABLET | Freq: Every day | ORAL | Status: DC
  Administered 2013-04-30 – 2013-05-02 (×3): 1 mg via ORAL
  Filled 2013-04-30 (×3): qty 1

## 2013-04-30 MED ORDER — METHYLPREDNISOLONE SODIUM SUCC 125 MG IJ SOLR
80.0000 mg | Freq: Two times a day (BID) | INTRAMUSCULAR | Status: AC
  Administered 2013-05-01 (×2): 80 mg via INTRAVENOUS
  Filled 2013-04-30 (×4): qty 1.28

## 2013-04-30 MED ORDER — IPRATROPIUM BROMIDE 0.02 % IN SOLN: 0.5000 mg | Freq: Four times a day (QID) | RESPIRATORY_TRACT | Status: DC

## 2013-04-30 MED ORDER — IBUPROFEN 400 MG PO TABS
400.0000 mg | ORAL_TABLET | Freq: Once | ORAL | Status: AC
  Administered 2013-04-30: 400 mg via ORAL
  Filled 2013-04-30: qty 1

## 2013-04-30 MED ORDER — VITAMIN B-1 100 MG PO TABS
100.0000 mg | ORAL_TABLET | Freq: Every day | ORAL | Status: DC
  Administered 2013-04-30 – 2013-05-02 (×3): 100 mg via ORAL
  Filled 2013-04-30 (×3): qty 1

## 2013-04-30 MED ORDER — METHYLPREDNISOLONE SODIUM SUCC 125 MG IJ SOLR
125.0000 mg | INTRAMUSCULAR | Status: AC
  Administered 2013-04-30: 125 mg via INTRAVENOUS
  Filled 2013-04-30: qty 2

## 2013-04-30 MED ORDER — SODIUM CHLORIDE 0.9 % IV BOLUS (SEPSIS)
500.0000 mL | INTRAVENOUS | Status: AC
  Administered 2013-04-30: 500 mL via INTRAVENOUS

## 2013-04-30 MED ORDER — ENOXAPARIN SODIUM 40 MG/0.4ML ~~LOC~~ SOLN
40.0000 mg | SUBCUTANEOUS | Status: DC
  Administered 2013-04-30 – 2013-05-01 (×2): 40 mg via SUBCUTANEOUS
  Filled 2013-04-30 (×3): qty 0.4

## 2013-04-30 MED ORDER — THIAMINE HCL 100 MG/ML IJ SOLN
100.0000 mg | Freq: Every day | INTRAMUSCULAR | Status: DC
  Filled 2013-04-30 (×3): qty 1

## 2013-04-30 MED ORDER — ACETAMINOPHEN 325 MG PO TABS
650.0000 mg | ORAL_TABLET | Freq: Once | ORAL | Status: AC
  Administered 2013-04-30: 650 mg via ORAL
  Filled 2013-04-30: qty 2

## 2013-04-30 MED ORDER — ALBUTEROL SULFATE (5 MG/ML) 0.5% IN NEBU
5.0000 mg | INHALATION_SOLUTION | Freq: Once | RESPIRATORY_TRACT | Status: AC
  Administered 2013-04-30: 5 mg via RESPIRATORY_TRACT
  Filled 2013-04-30: qty 1

## 2013-04-30 MED ORDER — ADULT MULTIVITAMIN W/MINERALS CH
1.0000 | ORAL_TABLET | Freq: Every day | ORAL | Status: DC
  Administered 2013-04-30 – 2013-05-02 (×3): 1 via ORAL
  Filled 2013-04-30 (×3): qty 1

## 2013-04-30 MED ORDER — LEVALBUTEROL HCL 0.63 MG/3ML IN NEBU
0.6300 mg | INHALATION_SOLUTION | RESPIRATORY_TRACT | Status: DC
  Administered 2013-04-30: 0.63 mg via RESPIRATORY_TRACT
  Filled 2013-04-30: qty 3

## 2013-04-30 MED ORDER — LEVALBUTEROL HCL 0.63 MG/3ML IN NEBU
0.6300 mg | INHALATION_SOLUTION | Freq: Four times a day (QID) | RESPIRATORY_TRACT | Status: DC
  Administered 2013-05-01 – 2013-05-02 (×6): 0.63 mg via RESPIRATORY_TRACT
  Filled 2013-04-30 (×14): qty 3

## 2013-04-30 MED ORDER — DOXYCYCLINE HYCLATE 100 MG PO TABS
100.0000 mg | ORAL_TABLET | Freq: Two times a day (BID) | ORAL | Status: DC
  Administered 2013-04-30 – 2013-05-02 (×4): 100 mg via ORAL
  Filled 2013-04-30 (×5): qty 1

## 2013-04-30 MED ORDER — LORAZEPAM 1 MG PO TABS
1.0000 mg | ORAL_TABLET | Freq: Four times a day (QID) | ORAL | Status: DC | PRN
  Administered 2013-05-01 – 2013-05-02 (×3): 1 mg via ORAL
  Filled 2013-04-30 (×3): qty 1

## 2013-04-30 MED ORDER — IPRATROPIUM BROMIDE 0.02 % IN SOLN
0.5000 mg | RESPIRATORY_TRACT | Status: DC
  Administered 2013-04-30: 0.5 mg via RESPIRATORY_TRACT
  Filled 2013-04-30: qty 2.5

## 2013-04-30 MED ORDER — INFLUENZA VAC SPLIT QUAD 0.5 ML IM SUSP
0.5000 mL | INTRAMUSCULAR | Status: AC
  Administered 2013-05-01: 0.5 mL via INTRAMUSCULAR
  Filled 2013-04-30: qty 0.5

## 2013-04-30 MED ORDER — LORAZEPAM 2 MG/ML IJ SOLN: 1.0000 mg | Freq: Four times a day (QID) | INTRAMUSCULAR | Status: DC | PRN

## 2013-04-30 NOTE — H&P (Signed)
I repeated the critical or key portions of the exam.  I confirmed/revised the medical student's history, exam, assessment and plan.   

## 2013-04-30 NOTE — ED Provider Notes (Signed)
CSN: [id number]     Arrival date & time 04/30/13  0754 History   First MD Initiated Contact with Patient 04/30/13 [phone]     Chief Complaint  Patient presents with  . Shortness of Breath   (Consider location/radiation/quality/duration/timing/severity/associated sxs/prior Treatment) Patient is a 67 y.o. female presenting with shortness of breath. The history is provided by the patient.  Shortness of Breath Severity:  Mild Onset quality:  Gradual Duration:  4 hours Timing:  Constant Progression:  Worsening Chronicity:  Recurrent Context: URI   Relieved by:  Nothing Worsened by:  Nothing tried Ineffective treatments:  None tried Associated symptoms: cough and wheezing   Associated symptoms: no abdominal pain, no chest pain, no fever, no headaches, no neck pain and no vomiting     Past Medical History  Diagnosis Date  . Breast cancer    Past Surgical History  Procedure Laterality Date  . Mastectomy Left    History reviewed. No pertinent family history. History  Substance Use Topics  . Smoking status: Current Every Day Smoker  . Smokeless tobacco: Not on file  . Alcohol Use: Yes   OB History   Grav Para Term Preterm Abortions TAB SAB Ect Mult Living                 Review of Systems  Constitutional: Negative for fever and fatigue.  HENT: Negative for congestion and drooling.   Eyes: Negative for pain.  Respiratory: Positive for cough, shortness of breath and wheezing.   Cardiovascular: Negative for chest pain.  Gastrointestinal: Negative for nausea, vomiting, abdominal pain and diarrhea.  Genitourinary: Negative for dysuria and hematuria.  Musculoskeletal: Negative for back pain, gait problem and neck pain.  Skin: Negative for color change.  Neurological: Negative for dizziness and headaches.  Hematological: Negative for adenopathy.  Psychiatric/Behavioral: Negative for behavioral problems.  All other systems reviewed and are negative.    Allergies   Penicillins  Home Medications  No current outpatient prescriptions on file. BP 147/85  Pulse 118  Temp(Src) 98.8 F (37.1 C) (Oral)  Resp 26  SpO2 94% Physical Exam  Nursing note and vitals reviewed. Constitutional: She is oriented to person, place, and time. She appears well-developed and well-nourished.  HENT:  Head: Normocephalic.  Mouth/Throat: Oropharynx is clear and moist. No oropharyngeal exudate.  Eyes: Conjunctivae and EOM are normal. Pupils are equal, round, and reactive to light.  Neck: Normal range of motion. Neck supple.  Cardiovascular: Regular rhythm, normal heart sounds and intact distal pulses.  Exam reveals no gallop and no friction rub.   No murmur heard. HR 115  Pulmonary/Chest: She is in respiratory distress. She has wheezes.  Mild tachypnea on exam. Patient has decreased air movement diffusely with mild expiratory wheezing diffusely noted.  Abdominal: Soft. Bowel sounds are normal. There is no tenderness. There is no rebound and no guarding.  Musculoskeletal: Normal range of motion. She exhibits no edema and no tenderness.  Neurological: She is alert and oriented to person, place, and time.  Skin: Skin is warm and dry.  Psychiatric: She has a normal mood and affect. Her behavior is normal.    ED Course  Procedures (including critical care time) Labs Review Labs Reviewed  CBC WITH DIFFERENTIAL - Abnormal; Notable for the following:    MCV 102.7 (*)    MCH 36.5 (*)    Neutrophils Relative % 85 (*)    Lymphocytes Relative 8 (*)    Lymphs Abs 0.4 (*)    All other  components within normal limits  BASIC METABOLIC PANEL - Abnormal; Notable for the following:    Sodium 126 (*)    Chloride 89 (*)    Glucose, Bld 119 (*)    BUN 3 (*)    All other components within normal limits  CBC - Abnormal; Notable for the following:    RBC 3.82 (*)    MCV 104.7 (*)    MCH 37.2 (*)    All other components within normal limits  BASIC METABOLIC PANEL - Abnormal;  Notable for the following:    Sodium 130 (*)    Chloride 94 (*)    All other components within normal limits  PRO B NATRIURETIC PEPTIDE - Abnormal; Notable for the following:    Pro B Natriuretic peptide (BNP) 417.3 (*)    All other components within normal limits  D-DIMER, QUANTITATIVE - Abnormal; Notable for the following:    D-Dimer, Quant 0.74 (*)    All other components within normal limits  TROPONIN I  CREATININE, SERUM  MAGNESIUM  PHOSPHORUS  TROPONIN I  TROPONIN I  TROPONIN I  TSH   Imaging Review Dg Chest Port 1 View  04/30/2013   CLINICAL DATA:  Shortness of breath  EXAM: PORTABLE CHEST - 1 VIEW  COMPARISON:  02/02/2011  FINDINGS: The cardiac shadow is stable. Tortuosity of the thoracic aorta is again noted. The lungs are again well aerated without focal infiltrate or sizable effusion. No bony abnormality is seen.  IMPRESSION: No acute abnormality noted.   Electronically Signed   By: Alcide Clever M.D.   On: 04/30/2013 08:48    EKG Interpretation    Date/Time:  Sunday April 30 2013 08:03:15 EST Ventricular Rate:  119 PR Interval:  152 QRS Duration: 78 QT Interval:  316 QTC Calculation: 444 R Axis:   83 Text Interpretation:  Sinus tachycardia Biatrial enlargement Confirmed by Shalia Bartko  MD, Hannelore Bova ([id number]) on 04/30/2013 8:15:31 AM            MDM   1. COPD exacerbation    8:27 AM 67 y.o. female who presents with shortness of breath and nonproductive cough which began early this morning. The patient denies any history of COPD but states that she has wheezing when she gets upper respiratory tract infections. She was seen here 2 years ago for a similar presentation. She is a smoker. She denies any pain. She is afebrile and mildly tachycardic here. She is mildly tachypneic on exam but is able to speak in sentences. Will get steroids, breathing treatment, screening labs and chest x-ray. She is a history of breast cancer but this is relatively remote and has not had  any recurrence since her mastectomy. She denies any pain on exam.  Pt is s/p 2 alb nebs. SOB and WOB have improved on exam although she remains slightly tachypneic and is uncomfortable going home. Will get another neb and admit to internal medicine teaching service as pt is unassigned.     Junius Argyle, MD 05/01/13 [phone]

## 2013-04-30 NOTE — H&P (Signed)
Date: 04/30/2013               Patient Name:  Kayla Tyler MRN: [id number]  DOB: [birth year] Age / Sex: 67 y.o., female   PCP: Billee Cashing, MD              Medical Service: Internal Medicine Teaching Service              Attending Physician: Dr. Farley Ly, MD    First Contact: Bernadette Hoit, MS III Pager: [phone]  Second Contact: Dr. Boykin Peek Pager: [phone]  Third Contact Dr. Dow Adolph Pager: [phone]       After Hours (After 5p/  First Contact Pager: [phone]  weekends / holidays): Second Contact Pager: [phone]   Chief Complaint: shortness of breath  History of Present Illness: [name] is a 67 year old woman with PMH of breast cancer s/p left mastectomy and possible undiagnosed COPD who presents with a one-day history of shortness of breath. She reports getting significantly short of breath yesterday evening at 5 PM when she was mopping the kitchen, so she went and layed in bed, which helped her breathe slightly easier. She still felt like she wasn't breathing normally, however, and was unable to get to sleep because of this, eventually coming into the ED this morning. She reports having an episode of shortness of breath like this last year, when she went to her PCP and was given an albuterol inhaler. She thinks she was told she had COPD then, but is not sure.  She reports a chronic dry cough which has gotten worse since last night, headache, and feeling anxious. She denies fever, chills, sick contacts, recent travel, hemoptysis, history of blood clots, chest pain, or palpitations. She also reports significant alcohol use, drinking two large cans of Budweiser (possibly "tall boys"--24 oz each) nightly. Her last drink was yesterday afternoon around 3 PM.  She received 3 albuterol breathing treatments in the ED and was breathing much more easily when I saw her, not at her baseline but close to it.  Meds: Current Facility-Administered Medications    Medication Dose Route Frequency Provider Last Rate Last Dose  . doxycycline (VIBRA-TABS) tablet 100 mg  100 mg Oral Q12H Dow Adolph, MD   100 mg at 04/30/13 2111  . enoxaparin (LOVENOX) injection 40 mg  40 mg Subcutaneous Q24H Dow Adolph, MD   40 mg at 04/30/13 1847  . folic acid (FOLVITE) tablet 1 mg  1 mg Oral Daily Dow Adolph, MD   1 mg at 04/30/13 6644  . [START ON 05/01/2013] influenza vac split quadrivalent PF (FLUARIX) injection 0.5 mL  0.5 mL Intramuscular Tomorrow-1000 Farley Ly, MD      . Melene Muller ON 05/01/2013] ipratropium (ATROVENT) nebulizer solution 0.5 mg  0.5 mg Nebulization Q6H Dow Adolph, MD      . Melene Muller ON 05/01/2013] levalbuterol (XOPENEX) nebulizer solution 0.63 mg  0.63 mg Nebulization Q6H Dow Adolph, MD      . LORazepam (ATIVAN) tablet 1 mg  1 mg Oral Q6H PRN Dow Adolph, MD       Or  . LORazepam (ATIVAN) injection 1 mg  1 mg Intravenous Q6H PRN Dow Adolph, MD      . Melene Muller ON 05/01/2013] methylPREDNISolone sodium succinate (SOLU-MEDROL) 125 mg/2 mL injection 80 mg  80 mg Intravenous Q12H Dow Adolph, MD      . multivitamin with minerals tablet 1 tablet  1 tablet Oral Daily Dow Adolph, MD  1 tablet at 04/30/13 1849  . thiamine (VITAMIN B-1) tablet 100 mg  100 mg Oral Daily Dow Adolph, MD   100 mg at 04/30/13 1849   Or  . thiamine (B-1) injection 100 mg  100 mg Intravenous Daily Dow Adolph, MD      Per patient she is on no medications  Allergies: Allergies as of 04/30/2013 - Review Complete 04/30/2013  Allergen Reaction Noted  . Penicillins Other (See Comments) 04/30/2013  -PCN causes her to pass out  Past Medical History  Diagnosis Date  . Breast cancer   ~7 yrs ago in left breast. Treated with left mastectomy and chemotherapy. Told she was "cured"  Past Surgical History  Procedure Laterality Date  . Mastectomy Left     Social History -Not currently working. Formerly worked in Engineering geologist -Lives at home  with her 69 year old son -Tobacco: 20 pack years, currently smokes 1/2 ppd -Alcohol: drinks "2 tall cans of Budweiser" per day (possibly "tall boys"--24 oz) -Illegal drugs: no  Family History -Mother: passed away ~40 years ago from "diabetes" -Father: passed away ~40 years ago from MI -Brother living with diabetes -Sister living with asthma -Niece living with asthma  Review of Systems: Constitutional: as per HPI HEENT: negative for earache, sore throat CV: as per HPI Resp: as per HPI GI: negative for diarrhea, constipation, or abdominal pain Heme: as per HPI   Physical Exam: Blood pressure 139/71, pulse 109, temperature 98.7 F (37.1 C), temperature source Oral, resp. rate 19, height 5\' 4"  (1.626 m), weight 47.174 kg (104 lb), SpO2 100.00%. General: HEENT: muddy sclera, arcus senilis, PERRL, EOMI, moist mucous membranes, no oropharyngeal erythema or exudates, poor dentition CV: tachycardic (rate of ~120 when I auscultated), regular rhythm, no murmur auscultated Pulm: clear to auscultation bilaterally, no crackles or wheezes Abd: +BS, non-tender, non-distended Ext: no clubbing, cyanosis, or edema. 2+ radial and dorsalis pedis pulses   Lab results: CBC    Component Value Date/Time   WBC 5.5 04/30/2013 1640   WBC 6.9 02/26/2009 1434   RBC 3.82* 04/30/2013 1640   RBC 4.14 02/26/2009 1434   HGB 14.2 04/30/2013 1640   HGB 15.3 02/26/2009 1434   HCT 40.0 04/30/2013 1640   HCT 44.6 02/26/2009 1434   PLT 169 04/30/2013 1640   PLT 240 02/26/2009 1434   MCV 104.7* 04/30/2013 1640   MCV 107.7* 02/26/2009 1434   MCH 37.2* 04/30/2013 1640   MCH 37.0* 02/26/2009 1434   MCHC 35.5 04/30/2013 1640   MCHC 34.3 02/26/2009 1434   RDW 12.8 04/30/2013 1640   RDW 13.2 02/26/2009 1434   LYMPHSABS 0.4* 04/30/2013 0835   LYMPHSABS 2.4 02/26/2009 1434   MONOABS 0.4 04/30/2013 0835   MONOABS 0.4 02/26/2009 1434   EOSABS 0.0 04/30/2013 0835   EOSABS 0.6* 02/26/2009 1434   BASOSABS 0.0  04/30/2013 0835   BASOSABS 0.0 02/26/2009 1434   BMET    Component Value Date/Time   NA 126* 04/30/2013 0835   K 3.9 04/30/2013 0835   CL 89* 04/30/2013 0835   CO2 21 04/30/2013 0835   GLUCOSE 119* 04/30/2013 0835   BUN 3* 04/30/2013 0835   CREATININE 0.54 04/30/2013 1640   CALCIUM 8.7 04/30/2013 0835   GFRNONAA >90 04/30/2013 1640   GFRAA >90 04/30/2013 1640   Magnesium: 1.7 Phosphorus: 2.0  Troponin I: <0.03 x2  Imaging results:  Dg Chest Port 1 View  04/30/2013   CLINICAL DATA:  Shortness of breath  EXAM: PORTABLE CHEST - 1  VIEW  COMPARISON:  02/02/2011  FINDINGS: The cardiac shadow is stable. Tortuosity of the thoracic aorta is again noted. The lungs are again well aerated without focal infiltrate or sizable effusion. No bony abnormality is seen.  IMPRESSION: No acute abnormality noted.   Electronically Signed   By: Alcide Clever M.D.   On: 04/30/2013 08:48    EKG: sinus tachycardia, RAE, T waves inversions in anteroseptal leads and aVL  Assessment & Plan by Problem: [name] is a 67 year old woman with PMH of breast cancer s/p left mastectomy and possible undiagnosed COPD who presents with a one-day history of shortness of breath.  Shortness of breath Undiagnosed and untreated COPD likely given change in cough frequency, increased shortness of breath, and significant improvement with albuterol nebs. Although the patient has not been definitively diagnosed, she has a 20 pack year history, currently smokes 1/2 ppd, and was seen for a similar episode last year and prescribed albuterol. She may have adult-onset asthma instead of COPD given family history, but given tobacco use history COPD is more likely. PE is a possibility, but there is no sign of DVT and her only risk factors are age and smoking status. She only has 1.5 points on the Well's PE criteria--1.3% risk of PE. RAE seen on EKG may be 2/2 pulmonary HTN 2/2 COPD. RAE could also be due to chronic PE, but this is less  likely given acute nature of symptoms. No S1Q3T3 on EKG. -Doxycycline 100 mg PO BID -Levalbuterol 0.63 mg neb q4 hrs -Ipratropium 0.5 mg neb q4 hrs -Solu-medrol 80 mg IV q12 hrs -2 L O2 via nasal canula PRN -Arrange f/u with pulmonologist to do PFTs and evaluate COPD vs asthma  EKG changes T wave inversions in anteroseptal leads, no chest pain per patient. -Admit to telemetry -Repeat EKG tomorrow  Alcohol abuse The patient reports drinking "2 tall cans of Budweiser" per day, which could range anywhere from normal 12 oz cans (24 oz total) to 24 oz "tall boys" (48 oz total). Patient having tachycardia, anxiousness, headache--possibly signs of mild alcohol withdrawal. Per patient last drink was around 3 PM yesterday. -CIWA protocol  Hyponatremia Serum sodium of 126, hyposmolar (Sosm 259.7 not counting any EtOH). Appears euvolemic on exam. Stable blood pressure so not likely adrenal deficiency. No signs of hypothyroidism on exam. Possibly due to SIADH vs primary polydipsia. Received 500 ml IV NS bolus in ED since serum sodium was measured, will check on BMET tomorrow morning and if persists then will measure urine osmolality.  F/E/N -Regular diet  DVT prophylaxis -Lovenox 40 mg subq daily  Signed: Boykin Peek, MD 04/30/2013, 11:27 PM

## 2013-04-30 NOTE — ED Notes (Signed)
Pt c/o shortness of breath onset last night. Pt denies chest pain. Pt is short of breath with exertion and at rest. Pt able to talk in complete sentences.

## 2013-04-30 NOTE — H&P (Signed)
Date: 04/30/2013               Patient Name:  Kayla Tyler MRN: [id number]  DOB: May 19, 1945 Age / Sex: 67 y.o., female   PCP: Billee Cashing, MD              Medical Service: Internal Medicine Teaching Service              Attending Physician: Dr. Farley Ly, MD    First Contact: Bernadette Hoit, MS III Pager: [phone]  Second Contact: Dr. Boykin Peek Pager: [phone]  Third Contact Dr. Dow Adolph Pager: [phone]       After Hours (After 5p/  First Contact Pager: [phone]  weekends / holidays): Second Contact Pager: [phone]   Chief Complaint: shortness of breath  History of Present Illness: [name] is a 67 year old woman with PMH of breast cancer s/p left mastectomy and possible undiagnosed COPD who presents with a one-day history of shortness of breath. She reports getting significantly short of breath yesterday evening at 5 PM when she was mopping the kitchen, so she went and layed in bed, which helped her breathe slightly easier. She still felt like she wasn't breathing normally, however, and was unable to get to sleep because of this, eventually coming into the ED this morning. She reports having an episode of shortness of breath like this last year, when she went to her PCP and was given an albuterol inhaler. She thinks she was told she had COPD then, but is not sure.  She reports a chronic dry cough which has gotten worse since last night, headache, and feeling anxious. She denies fever, chills, sick contacts, recent travel, hemoptysis, history of blood clots, chest pain, or palpitations. She also reports significant alcohol use, drinking two large cans of Budweiser (possibly "tall boys"--24 oz each) nightly. Her last drink was yesterday afternoon around 3 PM.  She received 3 albuterol breathing treatments in the ED and was breathing much more easily when I saw her, not at her baseline but close to it.  Meds: No current facility-administered medications for this  encounter.  Per patient she is on no medications  Allergies: Allergies as of 04/30/2013 - Review Complete 04/30/2013  Allergen Reaction Noted  . Penicillins Other (See Comments) 04/30/2013  -PCN causes her to pass out  Past Medical History  Diagnosis Date  . Breast cancer   ~7 yrs ago in left breast. Treated with left mastectomy and chemotherapy. Told she was "cured"  Past Surgical History  Procedure Laterality Date  . Mastectomy Left     Social History -Not currently working. Formerly worked in Engineering geologist -Lives at home with her 77 year old son -Tobacco: 20 pack years, currently smokes 1/2 ppd -Alcohol: drinks "2 tall cans of Budweiser" per day (possibly "tall boys"--24 oz) -Illegal drugs: no  Family History -Mother: passed away ~40 years ago from "diabetes" -Father: passed away ~40 years ago from MI -Brother living with diabetes -Sister living with asthma -Niece living with asthma  Review of Systems: Constitutional: as per HPI HEENT: negative for earache, sore throat CV: as per HPI Resp: as per HPI GI: negative for diarrhea, constipation, or abdominal pain Heme: as per HPI   Physical Exam: Blood pressure 128/73, pulse 110, temperature 98.8 F (37.1 C), temperature source Oral, resp. rate 18, SpO2 95.00%. General: HEENT: muddy sclera, arcus senilis, PERRL, EOMI, moist mucous membranes, no oropharyngeal erythema or exudates, poor dentition CV: tachycardic (rate of ~120 when I auscultated),  regular rhythm, no murmur auscultated Pulm: clear to auscultation bilaterally, no crackles or wheezes Abd: +BS, non-tender, non-distended Ext: no clubbing, cyanosis, or edema. 2+ radial and dorsalis pedis pulses   Lab results: CBC    Component Value Date/Time   WBC 5.5 04/30/2013 1640   WBC 6.9 02/26/2009 1434   RBC 3.82* 04/30/2013 1640   RBC 4.14 02/26/2009 1434   HGB 14.2 04/30/2013 1640   HGB 15.3 02/26/2009 1434   HCT 40.0 04/30/2013 1640   HCT 44.6 02/26/2009 1434    PLT 169 04/30/2013 1640   PLT 240 02/26/2009 1434   MCV 104.7* 04/30/2013 1640   MCV 107.7* 02/26/2009 1434   MCH 37.2* 04/30/2013 1640   MCH 37.0* 02/26/2009 1434   MCHC 35.5 04/30/2013 1640   MCHC 34.3 02/26/2009 1434   RDW 12.8 04/30/2013 1640   RDW 13.2 02/26/2009 1434   LYMPHSABS 0.4* 04/30/2013 0835   LYMPHSABS 2.4 02/26/2009 1434   MONOABS 0.4 04/30/2013 0835   MONOABS 0.4 02/26/2009 1434   EOSABS 0.0 04/30/2013 0835   EOSABS 0.6* 02/26/2009 1434   BASOSABS 0.0 04/30/2013 0835   BASOSABS 0.0 02/26/2009 1434   BMET    Component Value Date/Time   NA 126* 04/30/2013 0835   K 3.9 04/30/2013 0835   CL 89* 04/30/2013 0835   CO2 21 04/30/2013 0835   GLUCOSE 119* 04/30/2013 0835   BUN 3* 04/30/2013 0835   CREATININE 0.54 04/30/2013 1640   CALCIUM 8.7 04/30/2013 0835   GFRNONAA >90 04/30/2013 1640   GFRAA >90 04/30/2013 1640   Magnesium: 1.7 Phosphorus: 2.0  Troponin I: <0.03 x2  Imaging results:  Dg Chest Port 1 View  04/30/2013   CLINICAL DATA:  Shortness of breath  EXAM: PORTABLE CHEST - 1 VIEW  COMPARISON:  02/02/2011  FINDINGS: The cardiac shadow is stable. Tortuosity of the thoracic aorta is again noted. The lungs are again well aerated without focal infiltrate or sizable effusion. No bony abnormality is seen.  IMPRESSION: No acute abnormality noted.   Electronically Signed   By: Alcide Clever M.D.   On: 04/30/2013 08:48    EKG: sinus tachycardia, RAE, T waves inversions in anteroseptal leads and aVL  Assessment & Plan by Problem: [name] is a 67 year old woman with PMH of breast cancer s/p left mastectomy and possible undiagnosed COPD who presents with a one-day history of shortness of breath.  Shortness of breath Undiagnosed and untreated COPD likely given change in cough frequency, increased shortness of breath, and significant improvement with albuterol nebs. Although the patient has not been definitively diagnosed, she has a 20 pack year history,  currently smokes 1/2 ppd, and was seen for a similar episode last year and prescribed albuterol. She may have adult-onset asthma instead of COPD given family history, but given tobacco use history COPD is more likely. PE is a possibility, but there is no sign of DVT and her only risk factors are age and smoking status. She only has 1.5 points on the Well's PE criteria--1.3% risk of PE. RAE seen on EKG may be 2/2 pulmonary HTN 2/2 COPD. RAE could also be due to chronic PE, but this is less likely given acute nature of symptoms. No S1Q3T3 on EKG. -Doxycycline 100 mg PO BID -Levalbuterol 0.63 mg neb q4 hrs -Ipratropium 0.5 mg neb q4 hrs -Solu-medrol 80 mg IV q12 hrs -2 L O2 via nasal canula PRN -Arrange f/u with pulmonologist to do PFTs and evaluate COPD vs asthma  EKG changes  T wave inversions in anteroseptal leads, no chest pain per patient. -Admit to telemetry -Repeat EKG tomorrow  Alcohol abuse The patient reports drinking "2 tall cans of Budweiser" per day, which could range anywhere from normal 12 oz cans (24 oz total) to 24 oz "tall boys" (48 oz total). Patient having tachycardia, anxiousness, headache--possibly signs of mild alcohol withdrawal. Per patient last drink was around 3 PM yesterday. -CIWA protocol  Hyponatremia Serum sodium of 126, hyposmolar (Sosm 259.7 not counting any EtOH). Appears euvolemic on exam. Stable blood pressure so not likely adrenal deficiency. No signs of hypothyroidism on exam. Possibly due to SIADH vs primary polydipsia. Received 500 ml IV NS bolus in ED since serum sodium was measured, will check on BMET tomorrow morning and if persists then will measure urine osmolality.  F/E/N -Regular diet  DVT prophylaxis -Lovenox 40 mg subq daily  This is a Psychologist, occupational Note.  The care of the patient was discussed with Dr. Delane Ginger and the assessment and plan was formulated with their assistance.  Please see their note for official documentation of the patient  encounter.   Signed: Arn Medal, Med Student 04/30/2013, 4:13 PM

## 2013-04-30 NOTE — Progress Notes (Signed)
04/30/2013 Patient transfer from the emergency room to [facility] at 1642. She is alert, oriented and ambulatory. Patient skin is intact, but dry, noted more on the feet and moles on skin. She have left mastectomy, which that arm is restricted. Patient does get short of breath on exertion and was place on oxygen, she is also on bed alarm, because she fell years ago. [facility] RN.

## 2013-04-30 NOTE — ED Notes (Signed)
Pt c/o sob, finished breathing tx and placed pt back on O2 nasal canula. O2 sats reading 93%. RR 36.

## 2013-05-01 ENCOUNTER — Inpatient Hospital Stay (HOSPITAL_COMMUNITY): Payer: PRIVATE HEALTH INSURANCE

## 2013-05-01 DIAGNOSIS — I369 Nonrheumatic tricuspid valve disorder, unspecified: Secondary | ICD-10-CM

## 2013-05-01 LAB — VITAMIN B12: Vitamin B-12: 918 pg/mL — ABNORMAL HIGH (ref 211–911)

## 2013-05-01 LAB — BASIC METABOLIC PANEL
BUN: 7 mg/dL (ref 6–23)
Calcium: 9.2 mg/dL (ref 8.4–10.5)
GFR calc Af Amer: >90 mL/min (ref >90)
GFR calc non Af Amer: >90 mL/min (ref >90)
Glucose, Bld: 96 mg/dL (ref 70–99)
Potassium: 3.8 mEq/L (ref 3.5–5.1)

## 2013-05-01 LAB — SAVE SMEAR

## 2013-05-01 LAB — RETICULOCYTES
RBC.: 3.61 MIL/uL — ABNORMAL LOW (ref 3.87–5.11)
Retic Count, Absolute: 50.5 10*3/uL (ref 19.0–186.0)
Retic Ct Pct: 1.4 % (ref 0.4–3.1)

## 2013-05-01 LAB — PRO B NATRIURETIC PEPTIDE: Pro B Natriuretic peptide (BNP): 417.3 pg/mL — ABNORMAL HIGH (ref 0–125)

## 2013-05-01 LAB — D-DIMER, QUANTITATIVE: D-Dimer, Quant: 0.74 ug/mL-FEU — ABNORMAL HIGH (ref 0.00–0.48)

## 2013-05-01 LAB — TROPONIN I: Troponin I: <0.3 ng/mL (ref <0.30)

## 2013-05-01 MED ORDER — IOHEXOL 350 MG/ML SOLN
80.0000 mL | Freq: Once | INTRAVENOUS | Status: AC | PRN
  Administered 2013-05-01: 80 mL via INTRAVENOUS

## 2013-05-01 MED ORDER — ACETAMINOPHEN 500 MG PO TABS
500.0000 mg | ORAL_TABLET | Freq: Four times a day (QID) | ORAL | Status: DC | PRN
  Administered 2013-05-01 – 2013-05-02 (×2): 500 mg via ORAL
  Filled 2013-05-01 (×2): qty 1

## 2013-05-01 MED ORDER — PREDNISONE 20 MG PO TABS
40.0000 mg | ORAL_TABLET | Freq: Every day | ORAL | Status: DC
  Administered 2013-05-02: 40 mg via ORAL
  Filled 2013-05-01 (×2): qty 2

## 2013-05-01 MED ORDER — GUAIFENESIN ER 600 MG PO TB12
600.0000 mg | ORAL_TABLET | Freq: Two times a day (BID) | ORAL | Status: DC
  Administered 2013-05-01 – 2013-05-02 (×2): 600 mg via ORAL
  Filled 2013-05-01 (×3): qty 1

## 2013-05-01 MED ORDER — SODIUM CHLORIDE 0.9 % IV SOLN
INTRAVENOUS | Status: AC
  Administered 2013-05-01: 16:00:00 via INTRAVENOUS

## 2013-05-01 MED ORDER — NICOTINE 14 MG/24HR TD PT24
14.0000 mg | MEDICATED_PATCH | Freq: Every day | TRANSDERMAL | Status: DC
  Administered 2013-05-01 – 2013-05-02 (×2): 14 mg via TRANSDERMAL
  Filled 2013-05-01 (×2): qty 1

## 2013-05-01 NOTE — Progress Notes (Signed)
Patient 94% on RA. Patient walked in the room to door. Oxygen dropped to 91% on RA.

## 2013-05-01 NOTE — H&P (Signed)
Internal Medicine Attending Admission Note Date: 05/01/2013  Patient name: Kayla Tyler Medical record number: [id number] Date of birth: 08-25-1945 Age: 67 y.o. Gender: female  I saw and evaluated the patient. I reviewed the resident's note and I agree with the resident's findings and plan as documented in the resident's note, with the following additional comments.  Chief Complaint(s): Shortness of breath  History - key components related to admission: Patient is a 67 year old woman with history of breast cancer status post left mastectomy, tobacco abuse, COPD, hypertension, alcohol use, hyperlipidemia, aneurysmal dilatation of the ascending aorta and coronary artery disease noted on CT scan of the chest 06/28/2008, and other problems as outlined in the medical history, admitted with complaint of shortness of breath.  Patient reports that her shortness of breath began on the afternoon prior to admission while she was mopping the kitchen and did not resolve; she has shortness of breath both with exertion and at rest.  She denies any prior recent respiratory symptoms, and she reports that her last episode of shortness of breath was 2 years ago.  She denies recent fever, chills, chest pain, orthopnea, PND, or lower extremity edema.  She is on no home bronchodilators, and says that she has never been told that she has asthma or COPD.   Physical Exam - key components related to admission:  Filed Vitals:   05/01/13 1000 05/01/13 1220 05/01/13 1222 05/01/13 1339  BP: 100/71   133/80  Pulse: 129   120  Temp: 98.5 F (36.9 C)   99.2 F (37.3 C)  TempSrc: Oral   Oral  Resp: 22   20  Height:      Weight:      SpO2: 91% 94% 91% 94%   General: Alert, no acute distress Lungs: Clear Heart: Regular; no extra sounds or murmurs Abdomen: Bowel sounds present, soft, nontender Extremities: No edema   Lab results:   Basic Metabolic Panel:  Recent Labs  95/63/87 0835 04/30/13 1640 05/01/13 0525   NA 126*  --  130*  K 3.9  --  3.8  CL 89*  --  94*  CO2 21  --  26  GLUCOSE 119*  --  96  BUN 3*  --  7  CREATININE 0.54 0.54 0.60  CALCIUM 8.7  --  9.2  MG  --  1.7  --   PHOS  --  2.9  --       CBC:  Recent Labs  04/30/13 0835 04/30/13 1640  WBC 5.7 5.5  HGB 14.8 14.2  HCT 41.7 40.0  MCV 102.7* 104.7*  PLT 211 169    Recent Labs  04/30/13 0835  NEUTROABS 4.8  LYMPHSABS 0.4*  MONOABS 0.4  EOSABS 0.0  BASOSABS 0.0    Cardiac Enzymes:  Recent Labs  04/30/13 1640 04/30/13 2226 05/01/13 0525  TROPONINI <0.30 <0.30 <0.30    BNP:  Recent Labs  05/01/13 0944  PROBNP 417.3*    D-Dimer:  Recent Labs  05/01/13 0944  DDIMER 0.74*      Urinalysis    Component Value Date/Time   COLORURINE YELLOW 06/28/2008 1100   APPEARANCEUR CLEAR 06/28/2008 1100   LABSPEC 1.010 06/28/2008 1100   PHURINE 5.0 06/28/2008 1100   GLUCOSEU NEGATIVE 06/28/2008 1100   HGBUR MODERATE* 06/28/2008 1100   BILIRUBINUR NEGATIVE 06/28/2008 1100   KETONESUR NEGATIVE 06/28/2008 1100   PROTEINUR NEGATIVE 06/28/2008 1100   UROBILINOGEN 0.2 06/28/2008 1100   NITRITE NEGATIVE 06/28/2008 1100   LEUKOCYTESUR SMALL*  06/28/2008 1100        Imaging results:  Dg Chest 2 View  05/01/2013   CLINICAL DATA:  Shortness of breath, asthma, bronchitis  EXAM: CHEST  2 VIEW  COMPARISON:  04/30/2013  FINDINGS: There is chronic bilateral interstitial thickening. The lungs are hyperinflated likely secondary to COPD. There is no focal parenchymal opacity, pleural effusion, or pneumothorax. The heart and mediastinal contours are unremarkable.  The osseous structures are unremarkable.  IMPRESSION: No active cardiopulmonary disease.   Electronically Signed   By: Elige Ko   On: 05/01/2013 11:16   Ct Angio Chest Pe W/cm &/or Wo Cm  05/01/2013   CLINICAL DATA:  Shortness of breath. Nonproductive cough. Breast cancer. Elevated D-dimer.  EXAM: CT ANGIOGRAPHY CHEST WITH CONTRAST  TECHNIQUE: Multidetector  CT imaging of the chest was performed using the standard protocol during bolus administration of intravenous contrast. Multiplanar CT image reconstructions including MIPs were obtained to evaluate the vascular anatomy.  CONTRAST:  80mL OMNIPAQUE IOHEXOL 350 MG/ML SOLN  COMPARISON:  Chest x-ray 05/01/2013.  FINDINGS: Ascending thoracic aorta measures 4.2 by 4.1 cm. No evidence of dissection. Aorta is atherosclerotic. Coronary artery disease present. Borderline cardiomegaly. Pulmonary arteries are normal. No pulmonary embolus. Shotty mediastinal lymph nodes. Sliding hiatal hernia. Adrenals normal.  Large airways are patent. Mild peribronchial cuffing suggesting possibility of bronchial/peribronchial inflammatory/ infectious change. Mild atelectasis left lung base. No significant pleural effusion or pneumothorax.  Thyroid appears normal. Shotty supraclavicular and axillary nodes are present. Surgical clips in the left axilla. Left mastectomy. Degenerative changes thoracic spine.  Review of the MIP images confirms the above findings.  IMPRESSION: 1. Bronchial and peribronchial thickening consistent with large airway disease. No pulmonary embolus. 2. Ascending thoracic aortic ectasia. 3. Coronary artery disease.   Electronically Signed   By: Maisie Fus  Register   On: 05/01/2013 12:13   Dg Chest Port 1 View  04/30/2013   CLINICAL DATA:  Shortness of breath  EXAM: PORTABLE CHEST - 1 VIEW  COMPARISON:  02/02/2011  FINDINGS: The cardiac shadow is stable. Tortuosity of the thoracic aorta is again noted. The lungs are again well aerated without focal infiltrate or sizable effusion. No bony abnormality is seen.  IMPRESSION: No acute abnormality noted.   Electronically Signed   By: Alcide Clever M.D.   On: 04/30/2013 08:48    Other results: EKG 12/14: Sinus tachycardia; atrial premature complex; biatrial enlargement; anterior infarct, old; baseline wander in lead(s) I II aVR aVL V1 V3  EKG 12/15: Normal sinus rhythm;  normal ECG  Assessment & Plan by Problem:  1.  Shortness of breath.  This is likely due to COPD, although the sudden onset of her symptoms in the absence of any prior recent respiratory symptoms seems puzzling.  She still has significant exertional dyspnea today, although her resting symptoms have improved.  She has no wheezing on lung exam today following additional treatment with inhaled bronchodilators, steroids, and an empiric antibiotic.  Her d-dimer was elevated, but a CT angiogram of the chest showed no evidence of pulmonary embolism.  A cardiac cause of her dyspnea is possible, and a 2-D echocardiogram is pending.  The plan is to treat with inhaled bronchodilators and steroids; supplement oxygen and follow saturations; will need pulmonary function testing as outpatient after she has recovered from this acute episode; I emphasized to her the importance of smoking cessation.  2.  Tobacco abuse.  Patient is an active smoker, and I discussed at length with her the importance of  smoking cessation.  Would discuss with her the options of nicotine replacement or other assistance with smoking cessation.  3.  Hyponatremia.  This is mild, and has improved a little since admission.  The etiology is unclear; may represent volume depletion or other process.  The plan is judicious IV normal saline volume replacement.  4.  Macrocytosis.  Patient reports more than moderate consumption of beer (she told me about 3 cans per day).  Would check a vitamin B12 and folate level.  5.  Alcohol.  Plan is CIWA protocol; counsel and provide information on resources.  6.  Other problems and plans as per the resident physician's note.

## 2013-05-01 NOTE — Progress Notes (Signed)
  Echocardiogram 2D Echocardiogram has been performed.  Cathie Beams 05/01/2013, 3:13 PM

## 2013-05-01 NOTE — Progress Notes (Signed)
Subjective: Kayla Tyler reports her breathing is better, although she does still feel short of breath and uses the nasal canula (2 L O2). Interestingly, she does not feel short of breath when she gets up to move around the room, go to the bathroom, etc., but rather when she is lying in bed. However, nurse found her O2 sat dropped from 94% to 9!% when she walked to the bathroom on room air. She denies chest pain, palpitations, or N/V.  Objective: Vital signs in last 24 hours: Filed Vitals:   05/01/13 0924 05/01/13 1000 05/01/13 1220 05/01/13 1222  BP:  100/71    Pulse:  129    Temp:  98.5 F (36.9 C)    TempSrc:  Oral    Resp:  22    Height:      Weight:      SpO2: 98% 91% 94% 91%   Weight change:  Intake/Output last 3 shifts: I/O last 3 completed shifts: In: 680 [P.O.:180; I.V.:500] Out: 475 [Urine:475] Intake/Output this shift: Total I/O In: 120 [P.O.:120] Out: -   General: anxious-appearing woman in NAD, sitting in bed on 2 L oxygen via nasal canula, speaking to [place] in full sentences without difficulty HEENT: moist mucous membranes, no oropharyngeal erythema or exudates CV: tachycardic, regular rhythm, no murmurs auscultated Pulm: mild wheezes in apices bilaterally, normal work of breathing Abd: +BS, non-tender, non-distended Ext: no clubbing, cyanosis, or edema. 2+ radial and posterior tibialis pulses Neuro: no asterixis  Lab Results: Lab Results  Component Value Date   WBC 5.5 04/30/2013   RBC 3.82* 04/30/2013   HGB 14.2 04/30/2013   HCT 40.0 04/30/2013   MCV 104.7* 04/30/2013   MCH 37.2* 04/30/2013   MCHC 35.5 04/30/2013   RDW 12.8 04/30/2013   PLT 169 04/30/2013   Lab Results  Component Value Date   NA 130* 05/01/2013   K 3.8 05/01/2013   CL 94* 05/01/2013   CO2 26 05/01/2013   BUN 7 05/01/2013   CREATININE 0.60 05/01/2013   CALCIUM 9.2 05/01/2013   ALBUMIN 3.6 02/26/2009   PHOS 2.9 04/30/2013   D-dimer: 0.74  Pro-BNP: 417.3  Micro Results: No  results found for this or any previous visit (from the past 240 hour(s)).  Studies/Results: Dg Chest 2 View  05/01/2013   CLINICAL DATA:  Shortness of breath, asthma, bronchitis  EXAM: CHEST  2 VIEW  COMPARISON:  04/30/2013  FINDINGS: There is chronic bilateral interstitial thickening. The lungs are hyperinflated likely secondary to COPD. There is no focal parenchymal opacity, pleural effusion, or pneumothorax. The heart and mediastinal contours are unremarkable.  The osseous structures are unremarkable.  IMPRESSION: No active cardiopulmonary disease.   Electronically Signed   By: Elige Ko   On: 05/01/2013 11:16   Ct Angio Chest Pe W/cm &/or Wo Cm  05/01/2013   CLINICAL DATA:  Shortness of breath. Nonproductive cough. Breast cancer. Elevated D-dimer.  EXAM: CT ANGIOGRAPHY CHEST WITH CONTRAST  TECHNIQUE: Multidetector CT imaging of the chest was performed using the standard protocol during bolus administration of intravenous contrast. Multiplanar CT image reconstructions including MIPs were obtained to evaluate the vascular anatomy.  CONTRAST:  80mL OMNIPAQUE IOHEXOL 350 MG/ML SOLN  COMPARISON:  Chest x-ray 05/01/2013.  FINDINGS: Ascending thoracic aorta measures 4.2 by 4.1 cm. No evidence of dissection. Aorta is atherosclerotic. Coronary artery disease present. Borderline cardiomegaly. Pulmonary arteries are normal. No pulmonary embolus. Shotty mediastinal lymph nodes. Sliding hiatal hernia. Adrenals normal.  Large airways are patent. Mild peribronchial  cuffing suggesting possibility of bronchial/peribronchial inflammatory/ infectious change. Mild atelectasis left lung base. No significant pleural effusion or pneumothorax.  Thyroid appears normal. Shotty supraclavicular and axillary nodes are present. Surgical clips in the left axilla. Left mastectomy. Degenerative changes thoracic spine.  Review of the MIP images confirms the above findings.  IMPRESSION: 1. Bronchial and peribronchial thickening  consistent with large airway disease. No pulmonary embolus. 2. Ascending thoracic aortic ectasia. 3. Coronary artery disease.   Electronically Signed   By: Maisie Fus  Register   On: 05/01/2013 12:13   Dg Chest Port 1 View  04/30/2013   CLINICAL DATA:  Shortness of breath  EXAM: PORTABLE CHEST - 1 VIEW  COMPARISON:  02/02/2011  FINDINGS: The cardiac shadow is stable. Tortuosity of the thoracic aorta is again noted. The lungs are again well aerated without focal infiltrate or sizable effusion. No bony abnormality is seen.  IMPRESSION: No acute abnormality noted.   Electronically Signed   By: Alcide Clever M.D.   On: 04/30/2013 08:48    Medications: Scheduled Meds: . doxycycline  100 mg Oral Q12H  . enoxaparin (LOVENOX) injection  40 mg Subcutaneous Q24H  . folic acid  1 mg Oral Daily  . ipratropium  0.5 mg Nebulization Q6H  . levalbuterol  0.63 mg Nebulization Q6H  . methylPREDNISolone (SOLU-MEDROL) injection  80 mg Intravenous Q12H  . multivitamin with minerals  1 tablet Oral Daily  . nicotine  14 mg Transdermal Daily  . thiamine  100 mg Oral Daily   Or  . thiamine  100 mg Intravenous Daily   Continuous Infusions:  PRN Meds:.LORazepam, LORazepam  Assessment/Plan: Kayla Tyler is a 67 year old woman with PMH of breast cancer s/p left mastectomy and possible undiagnosed COPD who presented with a one-day history of shortness of breath.   Shortness of breath  Undiagnosed and untreated COPD with exacerbation a possibility given change in cough frequency, increased shortness of breath, and significant improvement with albuterol nebs. Although the patient has not been definitively diagnosed, she has a 20 pack year history, currently smokes 1/2 ppd, and was seen for a similar episode last year and prescribed albuterol. She may have adult-onset asthma instead of COPD given family history, but given tobacco use and no environmental trigger identified COPD is more likely. Furthermore she has evidence  of hyperinflation on CXR favoring COPD. Treating possible walking pneumonia with doxy. PE is a possibility. The patient has no signs of DVT on exam, but has risk factors of age, smoking, and history of malignancy. If PE is equally as likely as COPD/asthma exacerbation diagnosis, she has 4.5 points on Well's PE criteria--16.2% chance of PE. D-dimer was elevated, but CTA showed no evidence of PE. CTA did show CAD, so shortness of breath may be 2/2 ischemic demand.  -Echocardiogram pending -Lipid panel for CAD risk stratification -Doxycycline 100 mg PO BID (12/14 - -Levalbuterol 0.63 mg neb q4 hrs  -Ipratropium 0.5 mg neb q4 hrs  -Solu-medrol 80 mg IV q12 hrs  -2 L O2 via nasal canula PRN -Flu vaccine -Arrange f/u with cardiologist for CAD -Arrange f/u with pulmonologist to do PFTs and evaluate COPD vs asthma   Hyponatremia  Serum sodium improved 130 from 126, hyposmolar (Sosm 259.7 not counting any EtOH). Appears euvolemic on exam. Stable blood pressure so not likely adrenal deficiency. No signs of hypothyroidism on exam. Possibly due to SIADH 2/2 pneumonia or HIV infection. -Follow on BMET -HIV antibody pending -TSH pending  Macrocytosis MCV of 104.7. No anemia (  Hg 14.4, Hct 40). Possibly due to high alcohol intake. -Check retic count, B12, folate  Alcohol abuse  The patient reports drinking "2 tall cans of Budweiser" per day, which could range anywhere from normal 12 oz cans (24 oz total) to 24 oz "tall boys" (48 oz total). Per patient last drink was around 3 PM on 04/29/13. No asterixis on exam. -CIWA protocol   Nicotine dependence -Nicotine patch 14 mg -Counseled patient about quitting tobacco  F/E/N  -500 ml IV NS at 75 cc/hr to prevent AKI given CTA w/ contrast today -Regular diet   DVT prophylaxis  -Lovenox 40 mg subq daily   This is a Psychologist, occupational Note.  The care of the patient was discussed with Dr. Delane Ginger and the assessment and plan formulated with their assistance.   Please see their attached note for official documentation of the daily encounter.   LOS: 1 day   Arn Medal, Med Student 05/01/2013, 12:57 PM

## 2013-05-01 NOTE — Progress Notes (Signed)
I repeated the critical or key portions of the exam.  I confirmed/revised the medical student's history, exam, assessment and plan.   

## 2013-05-01 NOTE — Progress Notes (Addendum)
Subjective:  Pt reports her breathing has improved but is still getting SOB when she walks around even with O2 at 2L.  She c/o generalized weakness.  Upon ambulation, when walking to the door from her bed her O2 sat dropped from 94 to 91%.  She denies CP, palpitations, or N/V/D/C.  Objective: Vital signs in last 24 hours: Filed Vitals:   05/01/13 1000 05/01/13 1220 05/01/13 1222 05/01/13 1339  BP: 100/71   133/80  Pulse: 129   120  Temp: 98.5 F (36.9 C)   99.2 F (37.3 C)  TempSrc: Oral   Oral  Resp: 22   20  Height:      Weight:      SpO2: 91% 94% 91% 94%   Weight change:  Intake/Output last 3 shifts: I/O last 3 completed shifts: In: 680 [P.O.:180; I.V.:500] Out: 475 [Urine:475] Intake/Output this shift: Total I/O In: 360 [P.O.:360] Out: -   General: anxious-appearing woman in NAD, sitting in bed on 2 L oxygen via nasal canula, speaking to [place] in full sentences without difficulty HEENT: moist mucous membranes, no oropharyngeal erythema or exudates CV: tachycardic, regular rhythm, no murmurs auscultated Pulm: mild wheezes in apices bilaterally, normal work of breathing Abd: +BS, non-tender, non-distended Ext: no clubbing, cyanosis, or edema. 2+ radial and posterior tibialis pulses Neuro: no asterixis  Lab Results: Lab Results  Component Value Date   WBC 5.5 04/30/2013   RBC 3.82* 04/30/2013   HGB 14.2 04/30/2013   HCT 40.0 04/30/2013   MCV 104.7* 04/30/2013   MCH 37.2* 04/30/2013   MCHC 35.5 04/30/2013   RDW 12.8 04/30/2013   PLT 169 04/30/2013   Lab Results  Component Value Date   NA 130* 05/01/2013   K 3.8 05/01/2013   CL 94* 05/01/2013   CO2 26 05/01/2013   BUN 7 05/01/2013   CREATININE 0.60 05/01/2013   CALCIUM 9.2 05/01/2013   ALBUMIN 3.6 02/26/2009   PHOS 2.9 04/30/2013   D-dimer: 0.74  Pro-BNP: 417.3  Micro Results: No results found for this or any previous visit (from the past 240 hour(s)).  Studies/Results: Dg Chest 2  View  05/01/2013   CLINICAL DATA:  Shortness of breath, asthma, bronchitis  EXAM: CHEST  2 VIEW  COMPARISON:  04/30/2013  FINDINGS: There is chronic bilateral interstitial thickening. The lungs are hyperinflated likely secondary to COPD. There is no focal parenchymal opacity, pleural effusion, or pneumothorax. The heart and mediastinal contours are unremarkable.  The osseous structures are unremarkable.  IMPRESSION: No active cardiopulmonary disease.   Electronically Signed   By: Elige Ko   On: 05/01/2013 11:16   Ct Angio Chest Pe W/cm &/or Wo Cm  05/01/2013   CLINICAL DATA:  Shortness of breath. Nonproductive cough. Breast cancer. Elevated D-dimer.  EXAM: CT ANGIOGRAPHY CHEST WITH CONTRAST  TECHNIQUE: Multidetector CT imaging of the chest was performed using the standard protocol during bolus administration of intravenous contrast. Multiplanar CT image reconstructions including MIPs were obtained to evaluate the vascular anatomy.  CONTRAST:  80mL OMNIPAQUE IOHEXOL 350 MG/ML SOLN  COMPARISON:  Chest x-ray 05/01/2013.  FINDINGS: Ascending thoracic aorta measures 4.2 by 4.1 cm. No evidence of dissection. Aorta is atherosclerotic. Coronary artery disease present. Borderline cardiomegaly. Pulmonary arteries are normal. No pulmonary embolus. Shotty mediastinal lymph nodes. Sliding hiatal hernia. Adrenals normal.  Large airways are patent. Mild peribronchial cuffing suggesting possibility of bronchial/peribronchial inflammatory/ infectious change. Mild atelectasis left lung base. No significant pleural effusion or pneumothorax.  Thyroid appears normal.  Shotty supraclavicular and axillary nodes are present. Surgical clips in the left axilla. Left mastectomy. Degenerative changes thoracic spine.  Review of the MIP images confirms the above findings.  IMPRESSION: 1. Bronchial and peribronchial thickening consistent with large airway disease. No pulmonary embolus. 2. Ascending thoracic aortic ectasia. 3. Coronary  artery disease.   Electronically Signed   By: Maisie Fus  Register   On: 05/01/2013 12:13   Dg Chest Port 1 View  04/30/2013   CLINICAL DATA:  Shortness of breath  EXAM: PORTABLE CHEST - 1 VIEW  COMPARISON:  02/02/2011  FINDINGS: The cardiac shadow is stable. Tortuosity of the thoracic aorta is again noted. The lungs are again well aerated without focal infiltrate or sizable effusion. No bony abnormality is seen.  IMPRESSION: No acute abnormality noted.   Electronically Signed   By: Alcide Clever M.D.   On: 04/30/2013 08:48    Medications: Scheduled Meds: . doxycycline  100 mg Oral Q12H  . enoxaparin (LOVENOX) injection  40 mg Subcutaneous Q24H  . folic acid  1 mg Oral Daily  . ipratropium  0.5 mg Nebulization Q6H  . levalbuterol  0.63 mg Nebulization Q6H  . methylPREDNISolone (SOLU-MEDROL) injection  80 mg Intravenous Q12H  . multivitamin with minerals  1 tablet Oral Daily  . nicotine  14 mg Transdermal Daily  . [START ON 05/02/2013] predniSONE  40 mg Oral Q breakfast  . thiamine  100 mg Oral Daily   Or  . thiamine  100 mg Intravenous Daily   Continuous Infusions: . sodium chloride 75 mL/hr at 05/01/13 1531   PRN Meds:.LORazepam, LORazepam  Assessment/Plan: Ms. Joscelynn Brutus is a 67 year old woman with PMH of breast cancer s/p left mastectomy and possible undiagnosed COPD who presented with a one-day history of shortness of breath.   Shortness of breath  Undiagnosed and untreated COPD with exacerbation a possibility given change in cough frequency, increased shortness of breath, and significant improvement with albuterol nebs. Although the patient has not been definitively diagnosed, she has a 20 pack year history, currently smokes 1/2 ppd, and was seen for a similar episode last year and prescribed albuterol. She may have adult-onset asthma instead of COPD given family history, but given tobacco use and no environmental trigger identified COPD is more likely. Furthermore she has evidence of  hyperinflation on CXR favoring COPD. Treating possible walking pneumonia with doxy. PE is a possibility. The patient has no signs of DVT on exam, but has risk factors of age, smoking, and history of malignancy. If PE is equally as likely as COPD/asthma exacerbation diagnosis, she has 4.5 points on Well's PE criteria--16.2% chance of PE. D-dimer was elevated, but CTA showed no evidence of PE. CTA did show CAD, so shortness of breath may be 2/2 ischemic demand.  -Echocardiogram pending -Doxycycline 100 mg PO BID (12/14 - -Levalbuterol 0.63 mg neb q4 hrs  -Ipratropium 0.5 mg neb q4 hrs  -Solu-medrol 80 mg IV q12 hrs  -switch to po prednisone in AM -2 L O2 via nasal canula PRN -Flu vaccine -Arrange f/u with cardiologist for CAD -Arrange f/u with pulmonologist to do PFTs and evaluate COPD vs asthma   Chronic Hyponatremia  Stable.  Upon viewing records, pt has experienced hyponatremia in the past.  Serum sodium improved 130 from 126, hyposmolar (Sosm 259.7 not counting any EtOH). Appears euvolemic on exam. Stable blood pressure so not likely adrenal deficiency. No signs of hypothyroidism on exam. Possibly due to SIADH 2/2 lung disease  -HIV antibody pending -TSH  pending  Macrocytosis MCV of 104.7. No anemia (Hg 14.4, Hct 40). Possibly due to alcohol abuse, B12 deficiency, liver disease, hypothyroidism. Pt endorses paresthesias of the hands b/l but denies any paresthesias of the feet.  -Check retic count, B12 (will check MMA if borderline, folate -peripheral smear -CMP   Alcohol abuse  The patient reports drinking "2 tall cans of Budweiser" per day, which could range anywhere from normal 12 oz cans (24 oz total) to 24 oz "tall boys" (48 oz total). Per patient last drink was around 3 PM on 04/29/13. No asterixis on exam. -CIWA protocol   Nicotine dependence -Nicotine patch 14 mg -Counseled patient about quitting tobacco  F/E/N  -500 ml IV NS at 75 cc/hr to prevent AKI given CTA w/ contrast  today -Regular diet   DVT prophylaxis  -Lovenox 40 mg subq daily    LOS: 1 day   Boykin Peek, MD 05/01/2013, 3:39 PM

## 2013-05-02 LAB — COMPREHENSIVE METABOLIC PANEL
ALT: 26 U/L (ref 0–35)
AST: 58 U/L — ABNORMAL HIGH (ref 0–37)
Albumin: 2.7 g/dL — ABNORMAL LOW (ref 3.5–5.2)
Alkaline Phosphatase: 376 U/L — ABNORMAL HIGH (ref 39–117)
CO2: 26 mEq/L (ref 19–32)
Calcium: 8.9 mg/dL (ref 8.4–10.5)
Chloride: 94 mEq/L — ABNORMAL LOW (ref 96–112)
GFR calc non Af Amer: >90 mL/min (ref >90)
Potassium: 4 mEq/L (ref 3.5–5.1)
Total Bilirubin: 0.3 mg/dL (ref 0.3–1.2)

## 2013-05-02 LAB — HIV ANTIBODY (ROUTINE TESTING W REFLEX): HIV: NONREACTIVE

## 2013-05-02 LAB — GLUCOSE, CAPILLARY: Glucose-Capillary: 91 mg/dL (ref 70–99)

## 2013-05-02 MED ORDER — PREDNISONE 20 MG PO TABS: 40.0000 mg | ORAL_TABLET | Freq: Every day | ORAL | Status: DC

## 2013-05-02 MED ORDER — LEVALBUTEROL TARTRATE 45 MCG/ACT IN AERO: 2.0000 | INHALATION_SPRAY | RESPIRATORY_TRACT | Status: DC | PRN

## 2013-05-02 MED ORDER — DOXYCYCLINE HYCLATE 100 MG PO TABS: 100.0000 mg | ORAL_TABLET | Freq: Two times a day (BID) | ORAL | Status: DC

## 2013-05-02 MED ORDER — MOMETASONE FURO-FORMOTEROL FUM 200-5 MCG/ACT IN AERO
2.0000 | INHALATION_SPRAY | Freq: Two times a day (BID) | RESPIRATORY_TRACT | Status: DC
  Filled 2013-05-02: qty 8.8

## 2013-05-02 MED ORDER — MOMETASONE FURO-FORMOTEROL FUM 200-5 MCG/ACT IN AERO: 2.0000 | INHALATION_SPRAY | Freq: Two times a day (BID) | RESPIRATORY_TRACT | Status: DC

## 2013-05-02 MED ORDER — MENTHOL 3 MG MT LOZG
1.0000 | LOZENGE | OROMUCOSAL | Status: DC | PRN
  Filled 2013-05-02: qty 9

## 2013-05-02 MED ORDER — TIOTROPIUM BROMIDE MONOHYDRATE 18 MCG IN CAPS: 18.0000 ug | ORAL_CAPSULE | Freq: Every day | RESPIRATORY_TRACT | Status: DC

## 2013-05-02 MED ORDER — NICOTINE 14 MG/24HR TD PT24: 14.0000 mg | MEDICATED_PATCH | Freq: Every day | TRANSDERMAL | Status: DC

## 2013-05-02 NOTE — Progress Notes (Signed)
NURSING PROGRESS NOTE  Kayla Tyler [id number] Discharge Data: 05/02/2013 6:40 PM Attending Provider: Farley Ly, MD [id number], Adela Lank, MD     Alain Honey to be D/C'd Home per MD order.  Discussed with the patient the After Visit Summary and all questions fully answered. All IV's discontinued with no bleeding noted. All belongings returned to patient for patient to take home. [facility]   Last Vital Signs:  Blood pressure 144/81, pulse 79, temperature 99.1 F (37.3 C), temperature source Oral, resp. rate 20, height 5\' 4"  (1.626 m), weight 47.492 kg (104 lb 11.2 oz), SpO2 96.00%.  Discharge Medication List   Medication List         doxycycline 100 MG tablet  Commonly known as:  VIBRA-TABS  Take 1 tablet (100 mg total) by mouth every 12 (twelve) hours.     GOODY HEADACHE PO  Take 1 packet by mouth daily as needed (for body pain).     levalbuterol 45 MCG/ACT inhaler  Commonly known as:  XOPENEX HFA  Inhale 2 puffs into the lungs every 4 (four) hours as needed for wheezing.     mometasone-formoterol 200-5 MCG/ACT Aero  Commonly known as:  DULERA  Inhale 2 puffs into the lungs 2 (two) times daily.     nicotine 14 mg/24hr patch  Commonly known as:  NICODERM CQ - dosed in mg/24 hours  Place 1 patch (14 mg total) onto the skin daily.     predniSONE 20 MG tablet  Commonly known as:  DELTASONE  Take 2 tablets (40 mg total) by mouth daily with breakfast.     tiotropium 18 MCG inhalation capsule  Commonly known as:  SPIRIVA HANDIHALER  Place 1 capsule (18 mcg total) into inhaler and inhale daily.

## 2013-05-02 NOTE — Discharge Summary (Signed)
Medical Student Discharge Summary  Name: Kayla Tyler MRN: [id number] DOB/AGE: Aug 07, 1945 67 y.o.  PCP: Kayla Cashing, MD  Admit date: 04/30/2013 Discharge date: 05/02/2013  Attending Provider: Farley Ly, MD   Admission Diagnoses:   1. Acute respiratory distress 2. Alcohol abuse 3. Tobacco abuse  Discharge Diagnoses:  1. Probable COPD 2. Alcohol abuse 3. Tobacco abuse  Consultations:    Procedures performed: Dg Chest 2 View  05/01/2013   CLINICAL DATA:  Shortness of breath, asthma, bronchitis  EXAM: CHEST  2 VIEW  COMPARISON:  04/30/2013  FINDINGS: There is chronic bilateral interstitial thickening. The lungs are hyperinflated likely secondary to COPD. There is no focal parenchymal opacity, pleural effusion, or pneumothorax. The heart and mediastinal contours are unremarkable.  The osseous structures are unremarkable.  IMPRESSION: No active cardiopulmonary disease.   Electronically Signed   By: Kayla Tyler   On: 05/01/2013 11:16   Ct Angio Chest Pe W/cm &/or Wo Cm  05/01/2013   CLINICAL DATA:  Shortness of breath. Nonproductive cough. Breast cancer. Elevated D-dimer.  EXAM: CT ANGIOGRAPHY CHEST WITH CONTRAST  TECHNIQUE: Multidetector CT imaging of the chest was performed using the standard protocol during bolus administration of intravenous contrast. Multiplanar CT image reconstructions including MIPs were obtained to evaluate the vascular anatomy.  CONTRAST:  80mL OMNIPAQUE IOHEXOL 350 MG/ML SOLN  COMPARISON:  Chest x-ray 05/01/2013.  FINDINGS: Ascending thoracic aorta measures 4.2 by 4.1 cm. No evidence of dissection. Aorta is atherosclerotic. Coronary artery disease present. Borderline cardiomegaly. Pulmonary arteries are normal. No pulmonary embolus. Shotty mediastinal lymph nodes. Sliding hiatal hernia. Adrenals normal.  Large airways are patent. Mild peribronchial cuffing suggesting possibility of bronchial/peribronchial inflammatory/ infectious change. Mild  atelectasis left lung base. No significant pleural effusion or pneumothorax.  Thyroid appears normal. Shotty supraclavicular and axillary nodes are present. Surgical clips in the left axilla. Left mastectomy. Degenerative changes thoracic spine.  Review of the MIP images confirms the above findings.  IMPRESSION: 1. Bronchial and peribronchial thickening consistent with large airway disease. No pulmonary embolus. 2. Ascending thoracic aortic ectasia. 3. Coronary artery disease.   Electronically Signed   By: Kayla Fus  Tyler   On: 05/01/2013 12:13   Dg Chest Port 1 View  04/30/2013   CLINICAL DATA:  Shortness of breath  EXAM: PORTABLE CHEST - 1 VIEW  COMPARISON:  02/02/2011  FINDINGS: The cardiac shadow is stable. Tortuosity of the thoracic aorta is again noted. The lungs are again well aerated without focal infiltrate or sizable effusion. No bony abnormality is seen.  IMPRESSION: No acute abnormality noted.   Electronically Signed   By: Alcide Clever M.D.   On: 04/30/2013 08:48    Admission HPI: [name] is a 67 year old woman with PMH of breast cancer s/p left mastectomy and possible undiagnosed COPD who presents with a one-day history of shortness of breath. She reports getting significantly short of breath yesterday evening at 5 PM when she was mopping the kitchen, so she went and layed in bed, which helped her breathe slightly easier. She still felt like she wasn't breathing normally, however, and was unable to get to sleep because of this, eventually coming into the ED this morning. She reports having an episode of shortness of breath like this last year, when she went to her PCP and was given an albuterol inhaler. She thinks she was told she had COPD then, but is not sure.  She reports a chronic dry cough which has gotten worse since last night,  headache, and feeling anxious. She denies fever, chills, sick contacts, recent travel, hemoptysis, history of blood clots, chest pain, or palpitations.  She also reports significant alcohol use, drinking two large cans of Budweiser (possibly "tall boys"--24 oz each) nightly. Her last drink was yesterday afternoon around 3 PM.  She received 3 albuterol breathing treatments in the ED and was breathing much more easily when I saw her, not at her baseline but close to it.  Chief complaint: shortness of breath   Hospital Course by problem list:  Probable COPD exacerbation The patient presented in acute respiratory distress that was most likely due to a COPD exacerbation. Although the patient had not been definitively diagnosed, she has CXR findings consistent with COPD (hyperinflation), a 20 pack year smoking history, currently smokes 1/2 ppd, and has had multiple admissions for similar respiratory distress episodes in the past. PE was ruled out with a CTA after she was found to have an elevated D-dimer. The patient was treated with Atrovent nebs 0.5 mg q4 hrs PRN, Solu-medrol 80 mg  IV q/12 hrs, Dulera 200-5 inhaler BID, and one dose of prednisone 40 mg PO. The patient was also treated with doxycycline 100 mg BID to cover a possible walking pneumonia, and will complete a 7-day course on 05/07/13. The patient used 2 L of oxygen via nasal canula throughout most of her hospital stay. Her O2 sat was found to drop to 85% on room air while ambulating, returning to 95% once placed back on room air. The patient will thus be discharged with 2 L oxygen tank for home use PRN, Dulera 200-5 mcg/act inhaler 2 puffs BID, Xopenex nebulizer 0.63 mg q6 hrs, and Spiriva 18 mg. The patient will also complete a 5-day course of prednisone 40 mg PO qd as an outpatient. She will have a hospital follow-up appointment at the [facility] clinic for further optimization of her COPD treatment.  Alcohol abuse The patient reports drinking 48 oz of beer nightly--typically two "tall boys" of Budweiser. The patient's last drink was around 3 PM the afternoon before admission.  She was placed on CIWA protocol, and remained in the "very mild withdrawal" range throughout her stay, with scores of 9, 8, and 3. The patient was counseled on reducing her alcohol use to 24 oz of beer or less per night, and not on every night of the week. The patient was in the contemplation stage of reducing her alcohol intake.  Tobacco abuse The patient reports a 20 pack year history, and currently smokes 1/2 ppd. The patient was counseled on quitting tobacco, and it was explained to her how smoking contributed to her respiratory distress and probable COPD. The patient was in the preparation stage of quitting tobacco. She was given a nicotine patch 14 mg daily during her time in the hospital, which she was discharged on and will continue to use at home as she attempts to quit smoking.   Discharge Exam: Blood pressure 149/80, pulse 116, temperature 99 F (37.2 C), temperature source Oral, resp. rate 18, height 5\' 4"  (1.626 m), weight 104 lb 11.2 oz (47.492 kg), SpO2 95.00%.  General: well-appearing woman in NAD, speaking in full sentences on 2 L O2 via nasal canula  HEENT: muddy sclera, no scleral icterus, moist mucous membranes  CV: tachycardic, regular rhythm, no murmurs auscultated  Pulm: clear to auscultation bilaterally  Abd: +BS, non-tender, non-distended, neg Murphy's sign  Ext: no clubbing, cyanosis, or edema. 2+ radial and dorsalis pedis pulses bilaterally  Neuro: no asterixis   Discharge labs:  CBC    Component Value Date/Time   WBC 5.5 04/30/2013 1640   WBC 6.9 02/26/2009 1434   RBC 3.61* 05/01/2013 1543   RBC 3.82* 04/30/2013 1640   RBC 4.14 02/26/2009 1434   HGB 14.2 04/30/2013 1640   HGB 15.3 02/26/2009 1434   HCT 40.0 04/30/2013 1640   HCT 44.6 02/26/2009 1434   PLT 169 04/30/2013 1640   PLT 240 02/26/2009 1434   MCV 104.7* 04/30/2013 1640   MCV 107.7* 02/26/2009 1434   MCH 37.2* 04/30/2013 1640   MCH 37.0* 02/26/2009 1434   MCHC 35.5 04/30/2013 1640   MCHC  34.3 02/26/2009 1434   RDW 12.8 04/30/2013 1640   RDW 13.2 02/26/2009 1434   LYMPHSABS 0.4* 04/30/2013 0835   LYMPHSABS 2.4 02/26/2009 1434   MONOABS 0.4 04/30/2013 0835   MONOABS 0.4 02/26/2009 1434   EOSABS 0.0 04/30/2013 0835   EOSABS 0.6* 02/26/2009 1434   BASOSABS 0.0 04/30/2013 0835   BASOSABS 0.0 02/26/2009 1434   CMP     Component Value Date/Time   NA 130* 05/02/2013 0550   K 4.0 05/02/2013 0550   CL 94* 05/02/2013 0550   CO2 26 05/02/2013 0550   GLUCOSE 100* 05/02/2013 0550   BUN 9 05/02/2013 0550   CREATININE 0.63 05/02/2013 0550   CALCIUM 8.9 05/02/2013 0550   PROT 7.5 05/02/2013 0550   ALBUMIN 2.7* 05/02/2013 0550   AST 58* 05/02/2013 0550   ALT 26 05/02/2013 0550   ALKPHOS 376* 05/02/2013 0550   BILITOT 0.3 05/02/2013 0550   GFRNONAA >90 05/02/2013 0550   GFRAA >90 05/02/2013 0550   HIV NON REACTIVE (12/15 1543)    Disposition and follow up: 01-Home or Self Care KaylaKimbree ALVETA Tyler was discharged from [facility] in Stable condition.    At the hospital follow up visit please address:  1. Improvement of her probable COPD exacerbation and success in taking her newly prescribed COPD medications--Dulera, Spiriva, Xopenex.   2. Please help Ms. Diles make a future appointment with a pulmonologist to do PFTs to definitively diagnose her COPD.  3.  Labs / imaging needed at time of follow-up: liver enzymes  4.  Pending labs/ test needing follow-up: none   Discharge Medications:   Medication List         doxycycline 100 MG tablet  Commonly known as:  VIBRA-TABS  Take 1 tablet (100 mg total) by mouth every 12 (twelve) hours.     GOODY HEADACHE PO  Take 1 packet by mouth daily as needed (for body pain).     levalbuterol 45 MCG/ACT inhaler  Commonly known as:  XOPENEX HFA  Inhale 2 puffs into the lungs every 4 (four) hours as needed for wheezing.     mometasone-formoterol 200-5 MCG/ACT Aero  Commonly known as:  DULERA  Inhale 2 puffs  into the lungs 2 (two) times daily.     nicotine 14 mg/24hr patch  Commonly known as:  NICODERM CQ - dosed in mg/24 hours  Place 1 patch (14 mg total) onto the skin daily.     predniSONE 20 MG tablet  Commonly known as:  DELTASONE  Take 2 tablets (40 mg total) by mouth daily with breakfast.     tiotropium 18 MCG inhalation capsule  Commonly known as:  SPIRIVA HANDIHALER  Place 1 capsule (18 mcg total) into inhaler and inhale daily.         Follow Up Appointments: Follow-up  Information   Follow up with Christen Bame, MD On 05/08/2013. (10:30AM)    Specialty:  Internal Medicine   Contact information:   [address] [place] [address] [phone]        Discharge Instructions:   Future Appointments Provider Department Dept Phone   05/08/2013 10:30 AM Christen Bame, MD [facility] [phone]       Signed: Arn Medal, Med Student 05/02/2013 4:29 PM

## 2013-05-02 NOTE — Progress Notes (Addendum)
checked patient on RA her she was 90%. While ambulating the patient O2 fluctuated rom 88% to 90%. Patient appeared SOB.  After returning to the patient's room rechecked patient's O2 on RA while sitting and she was 85%, placed back on 2 liter and O2 read 95%

## 2013-05-02 NOTE — Progress Notes (Signed)
Subjective:  Pt reports her breathing has improved but is still getting SOB when she walks around even with O2 at 2L.  She feels better this AM since receiving IVF.  Upon ambulation, when walking to the door from her bed her O2 sat dropped from 94 to 91%.  She denies CP, palpitations, or N/V/D/C.  Orthostatics were negative.    Objective: Vital signs in last 24 hours: Filed Vitals:   05/02/13 1246 05/02/13 1247 05/02/13 1300 05/02/13 1416  BP: 138/78 149/80    Pulse: 90 97 116   Temp:      TempSrc:      Resp:      Height:      Weight:      SpO2:   95% 95%   Weight change: 0.318 kg (11.2 oz) Intake/Output last 3 shifts: I/O last 3 completed shifts: In: 805 [P.O.:540; I.V.:265] Out: 0  Intake/Output this shift: Total I/O In: 240 [P.O.:240] Out: -   General: anxious-appearing woman in NAD, sitting in bed on 2 L oxygen via nasal canula, speaking to [place] in full sentences without difficulty HEENT: moist mucous membranes, no oropharyngeal erythema or exudates CV: tachycardic, regular rhythm, no murmurs auscultated Pulm: mild wheezes in apices bilaterally, normal work of breathing Abd: +BS, non-tender, non-distended Ext: no clubbing, cyanosis, or edema. 2+ radial and posterior tibialis pulses Neuro: no asterixis  Lab Results: Lab Results  Component Value Date   WBC 5.5 04/30/2013   RBC 3.61* 05/01/2013   HGB 14.2 04/30/2013   HCT 40.0 04/30/2013   MCV 104.7* 04/30/2013   MCH 37.2* 04/30/2013   MCHC 35.5 04/30/2013   RDW 12.8 04/30/2013   PLT 169 04/30/2013   Lab Results  Component Value Date   NA 130* 05/02/2013   K 4.0 05/02/2013   CL 94* 05/02/2013   CO2 26 05/02/2013   BUN 9 05/02/2013   CREATININE 0.63 05/02/2013   CALCIUM 8.9 05/02/2013   ALBUMIN 2.7* 05/02/2013   PHOS 2.9 04/30/2013   D-dimer: 0.74  Pro-BNP: 417.3  Micro Results: No results found for this or any previous visit (from the past 240 hour(s)).  Studies/Results: Dg Chest 2  View  05/01/2013   CLINICAL DATA:  Shortness of breath, asthma, bronchitis  EXAM: CHEST  2 VIEW  COMPARISON:  04/30/2013  FINDINGS: There is chronic bilateral interstitial thickening. The lungs are hyperinflated likely secondary to COPD. There is no focal parenchymal opacity, pleural effusion, or pneumothorax. The heart and mediastinal contours are unremarkable.  The osseous structures are unremarkable.  IMPRESSION: No active cardiopulmonary disease.   Electronically Signed   By: Elige Ko   On: 05/01/2013 11:16   Ct Angio Chest Pe W/cm &/or Wo Cm  05/01/2013   CLINICAL DATA:  Shortness of breath. Nonproductive cough. Breast cancer. Elevated D-dimer.  EXAM: CT ANGIOGRAPHY CHEST WITH CONTRAST  TECHNIQUE: Multidetector CT imaging of the chest was performed using the standard protocol during bolus administration of intravenous contrast. Multiplanar CT image reconstructions including MIPs were obtained to evaluate the vascular anatomy.  CONTRAST:  80mL OMNIPAQUE IOHEXOL 350 MG/ML SOLN  COMPARISON:  Chest x-ray 05/01/2013.  FINDINGS: Ascending thoracic aorta measures 4.2 by 4.1 cm. No evidence of dissection. Aorta is atherosclerotic. Coronary artery disease present. Borderline cardiomegaly. Pulmonary arteries are normal. No pulmonary embolus. Shotty mediastinal lymph nodes. Sliding hiatal hernia. Adrenals normal.  Large airways are patent. Mild peribronchial cuffing suggesting possibility of bronchial/peribronchial inflammatory/ infectious change. Mild atelectasis left lung base. No significant pleural  effusion or pneumothorax.  Thyroid appears normal. Shotty supraclavicular and axillary nodes are present. Surgical clips in the left axilla. Left mastectomy. Degenerative changes thoracic spine.  Review of the MIP images confirms the above findings.  IMPRESSION: 1. Bronchial and peribronchial thickening consistent with large airway disease. No pulmonary embolus. 2. Ascending thoracic aortic ectasia. 3. Coronary  artery disease.   Electronically Signed   By: Maisie Fus  Register   On: 05/01/2013 12:13    Medications: Scheduled Meds: . doxycycline  100 mg Oral Q12H  . enoxaparin (LOVENOX) injection  40 mg Subcutaneous Q24H  . folic acid  1 mg Oral Daily  . guaiFENesin  600 mg Oral BID  . ipratropium  0.5 mg Nebulization Q6H  . levalbuterol  0.63 mg Nebulization Q6H  . mometasone-formoterol  2 puff Inhalation BID  . multivitamin with minerals  1 tablet Oral Daily  . nicotine  14 mg Transdermal Daily  . predniSONE  40 mg Oral Q breakfast  . thiamine  100 mg Oral Daily   Or  . thiamine  100 mg Intravenous Daily   Continuous Infusions:   PRN Meds:.acetaminophen, LORazepam, LORazepam, menthol-cetylpyridinium  Assessment/Plan: [name] is a 67 year old woman with PMH of breast cancer s/p left mastectomy and possible undiagnosed COPD who presented with a one-day history of shortness of breath.   Probable COPD exacerbation  Undiagnosed and untreated COPD with exacerbation a possibility given change in cough frequency, increased shortness of breath, and significant improvement with albuterol nebs. Although the patient has not been definitively diagnosed, she has a 20 pack year history, currently smokes 1/2 ppd, and was seen for a similar episode last year and prescribed albuterol. She may have adult-onset asthma instead of COPD given family history, but given tobacco use and no environmental trigger identified COPD is more likely. Furthermore she has evidence of hyperinflation on CXR favoring COPD. Treating possible walking pneumonia with doxy. PE is a possibility. The patient has no signs of DVT on exam, but has risk factors of age, smoking, and history of malignancy. If PE is equally as likely as COPD/asthma exacerbation diagnosis, she has 4.5 points on Well's PE criteria--16.2% chance of PE. D-dimer was elevated, but CTA showed no evidence of PE. CTA did show CAD, so shortness of breath may be 2/2  ischemic demand.  Echocardiogram on 05/01/13 shows EF of 65-70% with PA pressure mildly increased.  When she ambulates her O2 saturation drops to 85%.  She will be sent home with 2L of home O2.  -Doxycycline 100 mg PO BID (12/14 - -Levalbuterol 0.63 mg neb q4 hrs  -Ipratropium 0.5 mg neb q4 hrs  -prednisone 40mg  in AM -2 L O2 via nasal canula PRN -Flu vaccine -Pt has an appt scheduled with the [facility] on Monday, Dec. 22  for follow up;  they should arrange f/u with pulmonologist to do PFTs and evaluate COPD vs asthma   Chronic Hyponatremia  Stable.  Upon viewing records, pt has experienced hyponatremia in the past.  Serum sodium improved 130 from 126, hyposmolar (Sosm 259.7 not counting any EtOH). Appears euvolemic on exam. Stable blood pressure so not likely adrenal deficiency. No signs of hypothyroidism on exam. 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  HIV negative.  TSH wnl.   Macrocytosis MCV of 104.7. No anemia (Hg 14.4, Hct 40). Possibly due to alcohol abuse, B12 deficiency, liver disease, hypothyroidism. Pt endorses paresthesias of the hands b/l but denies any paresthesias of the feet. Retic count wnl.  B12  slightly elevated.   Alcohol abuse  The patient reports drinking "2 tall cans of Budweiser" per day, which could range anywhere from normal 12 oz cans (24 oz total) to 24 oz "tall boys" (48 oz total). Per patient last drink was around 3 PM on 04/29/13. No asterixis on exam.  Pt CiWA score has been very low.  Social work was consulted.   Nicotine dependence -Nicotine patch 14 mg -Counseled patient about quitting tobacco  F/E/N  -Regular diet   DVT prophylaxis  -Lovenox 40 mg subq daily  Disposition -Pt will be discharged home today with home O2.     LOS: 2 days   Boykin Peek, MD 05/02/2013, 4:07 PM

## 2013-05-02 NOTE — Care Management Note (Signed)
SATURATION QUALIFICATIONS: (This note is used to comply with regulatory documentation for home oxygen)  Patient Saturations on Room Air at Rest = 88%  Patient Saturations on Room Air while Ambulating = 85%  Patient Saturations on 2 Liters of oxygen while Ambulating = 95%  Please briefly explain why patient needs home oxygen: Pt with history of COPD, now with increased SOB with activity and unable to perform ADLs due to SOB.

## 2013-05-02 NOTE — Discharge Summary (Signed)
Name: Kayla Tyler MRN: [id number] DOB: January 23, 1946 67 y.o. PCP: Billee Cashing, MD  Date of Admission: 04/30/2013  8:08 AM Date of Discharge: 05/02/2013 Attending Physician: Farley Ly, MD  Discharge Diagnosis: Active Problems:   HYPERLIPIDEMIA   HEPATIC CIRRHOSIS, NONALCOHOLIC   Excessive drinking alcohol   Smoking 1/2 pack a day or less   COPD (chronic obstructive pulmonary disease)   History of breast cancer   COPD exacerbation  Discharge Medications:   Medication List         doxycycline 100 MG tablet  Commonly known as:  VIBRA-TABS  Take 1 tablet (100 mg total) by mouth every 12 (twelve) hours.     GOODY HEADACHE PO  Take 1 packet by mouth daily as needed (for body pain).     levalbuterol 45 MCG/ACT inhaler  Commonly known as:  XOPENEX HFA  Inhale 2 puffs into the lungs every 4 (four) hours as needed for wheezing.     mometasone-formoterol 200-5 MCG/ACT Aero  Commonly known as:  DULERA  Inhale 2 puffs into the lungs 2 (two) times daily.     nicotine 14 mg/24hr patch  Commonly known as:  NICODERM CQ - dosed in mg/24 hours  Place 1 patch (14 mg total) onto the skin daily.     predniSONE 20 MG tablet  Commonly known as:  DELTASONE  Take 2 tablets (40 mg total) by mouth daily with breakfast.     tiotropium 18 MCG inhalation capsule  Commonly known as:  SPIRIVA HANDIHALER  Place 1 capsule (18 mcg total) into inhaler and inhale daily.        Disposition and follow-up:   Kayla Tyler was discharged from [facility] in Stable condition.  At the hospital follow up visit please address:  1.  Health maintenance-establish care, PFT's (should be scheduled), continued SOB  2.  Labs / imaging needed at time of follow-up: None  3.  Pending labs/ test needing follow-up: None  Follow-up Appointments:     Follow-up Information   Follow up with Christen Bame, MD On 05/08/2013. (10:30AM)    Specialty:  Internal Medicine   Contact  information:   [address] [place] [address] [phone]       Discharge Instructions:  Future Appointments Provider Department Dept Phone   05/08/2013 10:30 AM Christen Bame, MD [facility] [phone]      Consultations:  None  Procedures Performed:  Dg Chest 2 View  05/01/2013   CLINICAL DATA:  Shortness of breath, asthma, bronchitis  EXAM: CHEST  2 VIEW  COMPARISON:  04/30/2013  FINDINGS: There is chronic bilateral interstitial thickening. The lungs are hyperinflated likely secondary to COPD. There is no focal parenchymal opacity, pleural effusion, or pneumothorax. The heart and mediastinal contours are unremarkable.  The osseous structures are unremarkable.  IMPRESSION: No active cardiopulmonary disease.   Electronically Signed   By: Elige Ko   On: 05/01/2013 11:16   Ct Angio Chest Pe W/cm &/or Wo Cm  05/01/2013   CLINICAL DATA:  Shortness of breath. Nonproductive cough. Breast cancer. Elevated D-dimer.  EXAM: CT ANGIOGRAPHY CHEST WITH CONTRAST  TECHNIQUE: Multidetector CT imaging of the chest was performed using the standard protocol during bolus administration of intravenous contrast. Multiplanar CT image reconstructions including MIPs were obtained to evaluate the vascular anatomy.  CONTRAST:  80mL OMNIPAQUE IOHEXOL 350 MG/ML SOLN  COMPARISON:  Chest x-ray 05/01/2013.  FINDINGS: Ascending thoracic aorta measures 4.2 by 4.1  cm. No evidence of dissection. Aorta is atherosclerotic. Coronary artery disease present. Borderline cardiomegaly. Pulmonary arteries are normal. No pulmonary embolus. Shotty mediastinal lymph nodes. Sliding hiatal hernia. Adrenals normal.  Large airways are patent. Mild peribronchial cuffing suggesting possibility of bronchial/peribronchial inflammatory/ infectious change. Mild atelectasis left lung base. No significant pleural effusion or pneumothorax.  Thyroid appears normal. Shotty supraclavicular and axillary nodes are  present. Surgical clips in the left axilla. Left mastectomy. Degenerative changes thoracic spine.  Review of the MIP images confirms the above findings.  IMPRESSION: 1. Bronchial and peribronchial thickening consistent with large airway disease. No pulmonary embolus. 2. Ascending thoracic aortic ectasia. 3. Coronary artery disease.   Electronically Signed   By: Maisie Fus  Register   On: 05/01/2013 12:13   Dg Chest Port 1 View  04/30/2013   CLINICAL DATA:  Shortness of breath  EXAM: PORTABLE CHEST - 1 VIEW  COMPARISON:  02/02/2011  FINDINGS: The cardiac shadow is stable. Tortuosity of the thoracic aorta is again noted. The lungs are again well aerated without focal infiltrate or sizable effusion. No bony abnormality is seen.  IMPRESSION: No acute abnormality noted.   Electronically Signed   By: Alcide Clever M.D.   On: 04/30/2013 08:48   2D Echo:  Study Conclusions - Left ventricle: The cavity size was normal. Systolic function was vigorous. The estimated ejection fraction was in the range of 65% to 70%. There was no dynamic obstruction. Wall motion was normal; there were no regional wall motion abnormalities. - Pulmonary arteries: Systolic pressure was mildly increased. PA peak pressure: 36mm Hg (S).  Cardiac Cath: N/A  Admission HPI: Pt is a 67 y.o. female with a PMH significant for PMH of breast cancer s/p left mastectomy and COPD (no PFTs on file) who presents from the [facility] with shortness of breath. She reports no improvement in her shortness of breath since she was discharged last week (05/02/13) for similar symptoms and was diagnosed with a COPD exacerbation. She was discharged on home oxygen which she has been using mainly while ambulating or as needed. She was also discharged on xopenex, dulera, prednisone, and spiriva. She reports compliance with her medications except she has not picked up her spiriva. She denies any recent sick contacts. She reports a chronic dry cough but denies fever,  chills, hemoptysis, CP, or palpitations. She reports significant alcohol use, drinking two large cans of Budweiser (possibly "tall boys"--24 oz each) nightly. Unfortunately, she is still smoking daily (1 ppd). She was discharged with a nicotine patch but just picked it up today.  Of note, TTE on 05/01/13 shows EF of 65-70% with PA pressure mildly increased. During her last admission, with ambulation her O2 saturation drops to 85%. She was therefore sent home with 2L of home O2 to follow up in the [facility] today.   Hospital Course by problem list: Active Problems:   HYPERLIPIDEMIA   HEPATIC CIRRHOSIS, NONALCOHOLIC   Excessive drinking alcohol   Smoking 1/2 pack a day or less   COPD (chronic obstructive pulmonary disease)   History of breast cancer   COPD exacerbation   Probable COPD exacerbation  The patient presented in acute respiratory distress that was most likely due to a COPD exacerbation. Although the patient had not been definitively diagnosed, she has CXR findings consistent with COPD (hyperinflation), a 20 pack year smoking history, currently smokes 1/2 ppd, and has had multiple admissions for similar respiratory distress episodes in the past. PE was ruled out with a CTA after she  was found to have an elevated D-dimer. The patient was treated with Atrovent nebs 0.5 mg q4 hrs PRN, Solu-medrol 80 mg IV q/12 hrs, Dulera 200-5 inhaler BID, and one dose of prednisone 40 mg PO. The patient was also treated with doxycycline 100 mg BID to cover a possible walking pneumonia, and will complete a 7-day course on 05/07/13. The patient used 2 L of oxygen via nasal canula throughout most of her hospital stay. Her O2 sat was found to drop to 85% on room air while ambulating, returning to 95% once placed back on room air. The patient will thus be discharged with 2 L oxygen tank for home use PRN, Dulera 200-5 mcg/act inhaler 2 puffs BID, Xopenex nebulizer 0.63 mg q6 hrs, and Spiriva 18 mg. The patient will also  complete a 5-day course of prednisone 40 mg PO qd as an outpatient. She will have a hospital follow-up appointment at the [facility] clinic for further optimization of her COPD treatment.   Alcohol abuse  The patient reports drinking 48 oz of beer nightly--typically two "tall boys" of Budweiser. The patient's last drink was around 3 PM the afternoon before admission. She was placed on CIWA protocol, and remained in the "very mild withdrawal" range throughout her stay, with scores of 9, 8, and 3. The patient was counseled on reducing her alcohol use to 24 oz of beer or less per night, and not on every night of the week. The patient was in the contemplation stage of reducing her alcohol intake.   Tobacco abuse  The patient reports a 20 pack year history, and currently smokes 1/2 ppd. The patient was counseled on quitting tobacco, and it was explained to her how smoking contributed to her respiratory distress and probable COPD. The patient was in the preparation stage of quitting tobacco. She was given a nicotine patch 14 mg daily during her time in the hospital, which she was discharged on and will continue to use at home as she attempts to quit smoking.    Discharge Vitals:   BP 149/80  Pulse 116  Temp(Src) 99 F (37.2 C) (Oral)  Resp 18  Ht 5\' 4"  (1.626 m)  Wt 47.492 kg (104 lb 11.2 oz)  BMI 17.96 kg/m2  SpO2 95%  Discharge Labs:  Results for orders placed during the hospital encounter of 04/30/13 (from the past 24 hour(s))  COMPREHENSIVE METABOLIC PANEL     Status: Abnormal   Collection Time    05/02/13  5:50 AM      Result Value Range   Sodium 130 (*) 135 - 145 mEq/L   Potassium 4.0  3.5 - 5.1 mEq/L   Chloride 94 (*) 96 - 112 mEq/L   CO2 26  19 - 32 mEq/L   Glucose, Bld 100 (*) 70 - 99 mg/dL   BUN 9  6 - 23 mg/dL   Creatinine, Ser 0.98  0.50 - 1.10 mg/dL   Calcium 8.9  8.4 - 11.9 mg/dL   Total Protein 7.5  6.0 - 8.3 g/dL   Albumin 2.7 (*) 3.5 - 5.2 g/dL   AST  58 (*) 0 - 37 U/L   ALT 26  0 - 35 U/L   Alkaline Phosphatase 376 (*) 39 - 117 U/L   Total Bilirubin 0.3  0.3 - 1.2 mg/dL   GFR calc non Af Amer >90  >90 mL/min   GFR calc Af Amer >90  >90 mL/min  GAMMA GT     Status:  Abnormal   Collection Time    05/02/13  5:50 AM      Result Value Range   GGT 605 (*) 7 - 51 U/L  GLUCOSE, CAPILLARY     Status: None   Collection Time    05/02/13 11:23 AM      Result Value Range   Glucose-Capillary 91  70 - 99 mg/dL   Comment 1 Notify RN     Comment 2 Documented in Chart      Signed: Boykin Peek, MD 05/02/2013, 4:19 PM   Time Spent on Discharge: 50 minutes Services Ordered on Discharge: None Equipment Ordered on Discharge: Home oxygen

## 2013-05-02 NOTE — Progress Notes (Signed)
I repeated the critical or key portions of the exam.  I confirmed/revised the medical student's history, exam, assessment and plan.   

## 2013-05-02 NOTE — Progress Notes (Signed)
Subjective: Kayla Tyler was doing well this morning. She said her shortness of breath was better and felt back to her baseline, however she was on 2 L oxygen via nasal canula. Her nurse checked her O2 sat on room air, which was 90%, and it went down to 85-88% while ambulating. It returned to 95% once placed back on 2 liters. She denies chest pain, palpitations, or N/V.  Objective: Vital signs in last 24 hours: Filed Vitals:   05/02/13 0918 05/02/13 1200 05/02/13 1215 05/02/13 1220  BP: 113/77     Pulse: 100     Temp: 99 F (37.2 C)     TempSrc: Oral     Resp: 18     Height:      Weight:      SpO2: 98% 90% 88% 85%   Weight change: 11.2 oz (0.318 kg) Intake/Output last 3 shifts: I/O last 3 completed shifts: In: 805 [P.O.:540; I.V.:265] Out: 0  Intake/Output this shift: Total I/O In: 240 [P.O.:240] Out: -  General: well-appearing woman in NAD, speaking in full sentences on 2 L O2 via nasal canula HEENT: muddy sclera, no scleral icterus, moist mucous membranes CV: tachycardic, regular rhythm, no murmurs auscultated Pulm: clear to auscultation bilaterally Abd: +BS, non-tender, non-distended, neg Murphy's sign Ext: no clubbing, cyanosis, or edema. 2+ radial and dorsalis pedis pulses bilaterally Neuro: no asterixis  Lab Results: Lab Results  Component Value Date   WBC 5.5 04/30/2013   RBC 3.61* 05/01/2013   HGB 14.2 04/30/2013   HCT 40.0 04/30/2013   MCV 104.7* 04/30/2013   MCH 37.2* 04/30/2013   MCHC 35.5 04/30/2013   RDW 12.8 04/30/2013   PLT 169 04/30/2013   Lab Results  Component Value Date   NA 130* 05/02/2013   K 4.0 05/02/2013   CL 94* 05/02/2013   CO2 26 05/02/2013   BUN 9 05/02/2013   CREATININE 0.63 05/02/2013   CALCIUM 8.9 05/02/2013   ALBUMIN 2.7* 05/02/2013   PHOS 2.9 04/30/2013    Micro Results: No results found for this or any previous visit (from the past 240 hour(s)).  Studies/Results: Dg Chest 2 View  05/01/2013   CLINICAL DATA:  Shortness  of breath, asthma, bronchitis  EXAM: CHEST  2 VIEW  COMPARISON:  04/30/2013  FINDINGS: There is chronic bilateral interstitial thickening. The lungs are hyperinflated likely secondary to COPD. There is no focal parenchymal opacity, pleural effusion, or pneumothorax. The heart and mediastinal contours are unremarkable.  The osseous structures are unremarkable.  IMPRESSION: No active cardiopulmonary disease.   Electronically Signed   By: Elige Ko   On: 05/01/2013 11:16   Ct Angio Chest Pe W/cm &/or Wo Cm  05/01/2013   CLINICAL DATA:  Shortness of breath. Nonproductive cough. Breast cancer. Elevated D-dimer.  EXAM: CT ANGIOGRAPHY CHEST WITH CONTRAST  TECHNIQUE: Multidetector CT imaging of the chest was performed using the standard protocol during bolus administration of intravenous contrast. Multiplanar CT image reconstructions including MIPs were obtained to evaluate the vascular anatomy.  CONTRAST:  80mL OMNIPAQUE IOHEXOL 350 MG/ML SOLN  COMPARISON:  Chest x-ray 05/01/2013.  FINDINGS: Ascending thoracic aorta measures 4.2 by 4.1 cm. No evidence of dissection. Aorta is atherosclerotic. Coronary artery disease present. Borderline cardiomegaly. Pulmonary arteries are normal. No pulmonary embolus. Shotty mediastinal lymph nodes. Sliding hiatal hernia. Adrenals normal.  Large airways are patent. Mild peribronchial cuffing suggesting possibility of bronchial/peribronchial inflammatory/ infectious change. Mild atelectasis left lung base. No significant pleural effusion or pneumothorax.  Thyroid appears normal.  Shotty supraclavicular and axillary nodes are present. Surgical clips in the left axilla. Left mastectomy. Degenerative changes thoracic spine.  Review of the MIP images confirms the above findings.  IMPRESSION: 1. Bronchial and peribronchial thickening consistent with large airway disease. No pulmonary embolus. 2. Ascending thoracic aortic ectasia. 3. Coronary artery disease.   Electronically Signed   By:  Maisie Fus  Register   On: 05/01/2013 12:13    Medications: Scheduled Meds: . doxycycline  100 mg Oral Q12H  . enoxaparin (LOVENOX) injection  40 mg Subcutaneous Q24H  . folic acid  1 mg Oral Daily  . guaiFENesin  600 mg Oral BID  . ipratropium  0.5 mg Nebulization Q6H  . levalbuterol  0.63 mg Nebulization Q6H  . mometasone-formoterol  2 puff Inhalation BID  . multivitamin with minerals  1 tablet Oral Daily  . nicotine  14 mg Transdermal Daily  . predniSONE  40 mg Oral Q breakfast  . thiamine  100 mg Oral Daily   Or  . thiamine  100 mg Intravenous Daily   Continuous Infusions:  PRN Meds:.acetaminophen, LORazepam, LORazepam, menthol-cetylpyridinium  Assessment/Plan: Kayla Tyler is a 67 year old woman with PMH of breast cancer s/p left mastectomy and probable undiagnosed COPD who presented with a one-day history of shortness of breath.   Shortness of breath  Undiagnosed and untreated COPD with exacerbation most likely given change in cough frequency, increased shortness of breath, and significant improvement with albuterol nebs. Although the patient has not been definitively diagnosed, she has a 20 pack year history, currently smokes 1/2 ppd, and was seen for a similar episode last year and prescribed albuterol. She may have adult-onset asthma instead of COPD given family history, but given tobacco use and no environmental trigger identified COPD is more likely. Furthermore she has evidence of hyperinflation on CXR favoring COPD. Treating possible walking pneumonia with doxy. PE is a possibility. The patient has no signs of DVT on exam, but has risk factors of age, smoking, and history of malignancy. If PE is equally as likely as COPD/asthma exacerbation diagnosis, she has 4.5 points on Well's PE criteria--16.2% chance of PE. D-dimer was elevated, but CTA showed no evidence of PE. CTA did show CAD, so shortness of breath may be 2/2 ischemic demand. However, 2D echo showed no abnormalities and  normal EF of 65-70%. -Doxycycline 100 mg PO BID (12/14 -  -Levalbuterol 0.63 mg neb q4 hrs  -Ipratropium 0.5 mg neb q4 hrs  -Solu-medrol 80 mg IV q12 hrs  -2 L O2 via nasal canula PRN  -Flu vaccine given -Arrange f/u with pulmonologist to do PFTs and optimize COPD treatment  Mild hyponatremia, asymptomatic Serum sodium remains 130. hyposmolar (Sosm 270). Appears euvolemic on exam. Stable blood pressure so not likely adrenal deficiency. No signs of hypothyroidism on exam. TSH WNL, HIV antibody neg. 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 -Follow on BMET  -Check orthostatic vitals  Macrocytosis  MCV of 104.7. No anemia (Hg 14.4, Hct 40). Retic count 1.4%, Vit B12 and folate WNL,, no morphologic abnormalities seen on smear. Likely due to alcohol intake.  Alcohol abuse  The patient reports drinking 48 oz of beer per dayl. Per patient last drink was around 3 PM on 04/29/13. No asterixis on exam.  -CIWA protocol -Counseled patient to reduce drinking to no more than 24 oz beer per day, and not on every day of the week  Nicotine dependence  -Nicotine patch 14 mg  -Counseled patient about quitting tobacco   F/E/N  -  500 ml IV NS at 75 cc/hr to prevent AKI given CTA w/ contrast today  -Regular diet   DVT prophylaxis  -Lovenox 40 mg subq daily  This is a Psychologist, occupational Note.  The care of the patient was discussed with Dr. Delane Ginger and the assessment and plan formulated with their assistance.  Please see their attached note for official documentation of the daily encounter.   LOS: 2 days   Arn Medal, Med Student 05/02/2013, 1:08 PM

## 2013-05-02 NOTE — Progress Notes (Signed)
Internal Medicine Attending  Date: 05/02/2013  Patient name: Kayla Tyler Medical record number: [id number] Date of birth: [birth year] Age: 67 y.o. Gender: female  I saw and evaluated the patient and discussed her care on a.m. rounds with house staff. I reviewed the resident's note by Dr. Delane Ginger and I agree with the resident's findings and plans as documented in her note.

## 2013-05-04 LAB — VITAMIN D 1,25 DIHYDROXY
Vitamin D2 1, 25 (OH)2: <8 pg/mL
Vitamin D3 1, 25 (OH)2: 59 pg/mL

## 2013-05-08 ENCOUNTER — Inpatient Hospital Stay (HOSPITAL_COMMUNITY)
Admission: AD | Admit: 2013-05-08 | Discharge: 2013-05-10 | DRG: 194 | Disposition: A | Discharge status: Home / Self Care | Payer: PRIVATE HEALTH INSURANCE | Source: Ambulatory Visit | Attending: Internal Medicine | Admitting: Internal Medicine

## 2013-05-08 ENCOUNTER — Ambulatory Visit: Payer: PRIVATE HEALTH INSURANCE | Admitting: Internal Medicine

## 2013-05-08 ENCOUNTER — Encounter (HOSPITAL_COMMUNITY): Payer: Self-pay | Admitting: Emergency Medicine

## 2013-05-08 ENCOUNTER — Observation Stay (HOSPITAL_COMMUNITY): Payer: PRIVATE HEALTH INSURANCE

## 2013-05-08 ENCOUNTER — Encounter: Payer: Self-pay | Admitting: Internal Medicine

## 2013-05-08 VITALS — BP 116/76 | HR 122 | Temp 97.0°F | Ht 64.0 in | Wt 104.9 lb

## 2013-05-08 DIAGNOSIS — F17200 Nicotine dependence, unspecified, uncomplicated: Secondary | ICD-10-CM | POA: Diagnosis present

## 2013-05-08 DIAGNOSIS — J189 Pneumonia, unspecified organism: Principal | ICD-10-CM | POA: Diagnosis present

## 2013-05-08 DIAGNOSIS — Z9010 Acquired absence of unspecified breast and nipple: Secondary | ICD-10-CM

## 2013-05-08 DIAGNOSIS — F1010 Alcohol abuse, uncomplicated: Secondary | ICD-10-CM | POA: Diagnosis present

## 2013-05-08 DIAGNOSIS — E785 Hyperlipidemia, unspecified: Secondary | ICD-10-CM | POA: Diagnosis present

## 2013-05-08 DIAGNOSIS — J441 Chronic obstructive pulmonary disease with (acute) exacerbation: Secondary | ICD-10-CM | POA: Diagnosis present

## 2013-05-08 DIAGNOSIS — I2510 Atherosclerotic heart disease of native coronary artery without angina pectoris: Secondary | ICD-10-CM | POA: Diagnosis present

## 2013-05-08 DIAGNOSIS — E876 Hypokalemia: Secondary | ICD-10-CM | POA: Diagnosis present

## 2013-05-08 DIAGNOSIS — D7589 Other specified diseases of blood and blood-forming organs: Secondary | ICD-10-CM | POA: Diagnosis present

## 2013-05-08 DIAGNOSIS — R0600 Dyspnea, unspecified: Secondary | ICD-10-CM | POA: Insufficient documentation

## 2013-05-08 DIAGNOSIS — Z853 Personal history of malignant neoplasm of breast: Secondary | ICD-10-CM

## 2013-05-08 DIAGNOSIS — E871 Hypo-osmolality and hyponatremia: Secondary | ICD-10-CM | POA: Diagnosis present

## 2013-05-08 DIAGNOSIS — K7460 Unspecified cirrhosis of liver: Secondary | ICD-10-CM | POA: Diagnosis present

## 2013-05-08 DIAGNOSIS — I10 Essential (primary) hypertension: Secondary | ICD-10-CM | POA: Diagnosis present

## 2013-05-08 LAB — COMPREHENSIVE METABOLIC PANEL
ALT: 45 U/L — ABNORMAL HIGH (ref 0–35)
AST: 52 U/L — ABNORMAL HIGH (ref 0–37)
Albumin: 2.4 g/dL — ABNORMAL LOW (ref 3.5–5.2)
BUN: 8 mg/dL (ref 6–23)
Calcium: 8.7 mg/dL (ref 8.4–10.5)
GFR calc Af Amer: >90 mL/min (ref >90)
Sodium: 133 mEq/L — ABNORMAL LOW (ref 135–145)
Total Protein: 8.1 g/dL (ref 6.0–8.3)

## 2013-05-08 LAB — CBC WITH DIFFERENTIAL/PLATELET
Basophils Relative: 0 % (ref 0–1)
HCT: 36.4 % (ref 36.0–46.0)
Hemoglobin: 13.2 g/dL (ref 12.0–15.0)
Lymphs Abs: 1.1 10*3/uL (ref 0.7–4.0)
MCH: 37.4 pg — ABNORMAL HIGH (ref 26.0–34.0)
MCHC: 36.3 g/dL — ABNORMAL HIGH (ref 30.0–36.0)
MCV: 103.1 fL — ABNORMAL HIGH (ref 78.0–100.0)
Monocytes Absolute: 1.2 10*3/uL — ABNORMAL HIGH (ref 0.1–1.0)
Monocytes Relative: 9 % (ref 3–12)
Neutro Abs: 10.2 10*3/uL — ABNORMAL HIGH (ref 1.7–7.7)
Neutrophils Relative %: 82 % — ABNORMAL HIGH (ref 43–77)

## 2013-05-08 LAB — MAGNESIUM: Magnesium: 1.7 mg/dL (ref 1.5–2.5)

## 2013-05-08 MED ORDER — ACETAMINOPHEN 325 MG PO TABS
650.0000 mg | ORAL_TABLET | Freq: Four times a day (QID) | ORAL | Status: DC | PRN
  Administered 2013-05-10: 650 mg via ORAL
  Filled 2013-05-08: qty 2

## 2013-05-08 MED ORDER — IPRATROPIUM BROMIDE 0.02 % IN SOLN
0.5000 mg | RESPIRATORY_TRACT | Status: DC
  Administered 2013-05-08 – 2013-05-09 (×4): 0.5 mg via RESPIRATORY_TRACT
  Filled 2013-05-08 (×4): qty 2.5

## 2013-05-08 MED ORDER — POTASSIUM CHLORIDE CRYS ER 20 MEQ PO TBCR: 40.0000 meq | EXTENDED_RELEASE_TABLET | Freq: Once | ORAL | Status: DC

## 2013-05-08 MED ORDER — POTASSIUM CHLORIDE CRYS ER 20 MEQ PO TBCR
40.0000 meq | EXTENDED_RELEASE_TABLET | Freq: Two times a day (BID) | ORAL | Status: AC
  Administered 2013-05-09 (×2): 40 meq via ORAL
  Filled 2013-05-08 (×2): qty 2

## 2013-05-08 MED ORDER — ALPRAZOLAM 0.25 MG PO TABS: 0.2500 mg | ORAL_TABLET | Freq: Once | ORAL | Status: DC

## 2013-05-08 MED ORDER — POTASSIUM CHLORIDE CRYS ER 20 MEQ PO TBCR
40.0000 meq | EXTENDED_RELEASE_TABLET | Freq: Once | ORAL | Status: DC
  Administered 2013-05-08: 40 meq via ORAL

## 2013-05-08 MED ORDER — TIOTROPIUM BROMIDE MONOHYDRATE 18 MCG IN CAPS
18.0000 ug | ORAL_CAPSULE | Freq: Every day | RESPIRATORY_TRACT | Status: DC
  Filled 2013-05-08: qty 5

## 2013-05-08 MED ORDER — VANCOMYCIN HCL IN DEXTROSE 1-5 GM/200ML-% IV SOLN
1000.0000 mg | INTRAVENOUS | Status: DC
  Administered 2013-05-08 – 2013-05-09 (×2): 1000 mg via INTRAVENOUS
  Filled 2013-05-08 (×3): qty 200

## 2013-05-08 MED ORDER — ENOXAPARIN SODIUM 40 MG/0.4ML ~~LOC~~ SOLN
40.0000 mg | SUBCUTANEOUS | Status: DC
  Administered 2013-05-08 – 2013-05-09 (×2): 40 mg via SUBCUTANEOUS
  Filled 2013-05-08 (×3): qty 0.4

## 2013-05-08 MED ORDER — DOXYCYCLINE HYCLATE 100 MG PO TABS
100.0000 mg | ORAL_TABLET | Freq: Two times a day (BID) | ORAL | Status: DC
  Filled 2013-05-08: qty 1

## 2013-05-08 MED ORDER — ACETAMINOPHEN 650 MG RE SUPP: 650.0000 mg | Freq: Four times a day (QID) | RECTAL | Status: DC | PRN

## 2013-05-08 MED ORDER — LEVOFLOXACIN 750 MG PO TABS
750.0000 mg | ORAL_TABLET | Freq: Every day | ORAL | Status: DC
  Administered 2013-05-08 – 2013-05-10 (×3): 750 mg via ORAL
  Filled 2013-05-08 (×3): qty 1

## 2013-05-08 MED ORDER — LORAZEPAM 1 MG PO TABS
1.0000 mg | ORAL_TABLET | Freq: Once | ORAL | Status: AC
  Administered 2013-05-08: 1 mg via ORAL
  Filled 2013-05-08: qty 1

## 2013-05-08 MED ORDER — LEVALBUTEROL TARTRATE 45 MCG/ACT IN AERO: 2.0000 | INHALATION_SPRAY | RESPIRATORY_TRACT | Status: DC | PRN

## 2013-05-08 MED ORDER — NICOTINE 14 MG/24HR TD PT24
14.0000 mg | MEDICATED_PATCH | Freq: Every day | TRANSDERMAL | Status: DC
  Administered 2013-05-08 – 2013-05-10 (×3): 14 mg via TRANSDERMAL
  Filled 2013-05-08 (×3): qty 1

## 2013-05-08 MED ORDER — MOMETASONE FURO-FORMOTEROL FUM 200-5 MCG/ACT IN AERO
2.0000 | INHALATION_SPRAY | Freq: Two times a day (BID) | RESPIRATORY_TRACT | Status: DC
  Filled 2013-05-08: qty 8.8

## 2013-05-08 MED ORDER — LEVALBUTEROL HCL 0.63 MG/3ML IN NEBU
0.6300 mg | INHALATION_SOLUTION | Freq: Four times a day (QID) | RESPIRATORY_TRACT | Status: DC
  Administered 2013-05-08 – 2013-05-09 (×4): 0.63 mg via RESPIRATORY_TRACT
  Filled 2013-05-08 (×8): qty 3

## 2013-05-08 NOTE — Progress Notes (Signed)
CRITICAL VALUE ALERT  Critical value received:  Potassium 2.6  Date of notification:  05/08/2013  Time of notification:  1837  Critical value read back:yes  Nurse who received alert:  Lovie Macadamia RN.  MD notified (1st page):  Dr Delane Ginger  Time of first page:  1840  MD notified (2nd page):  Time of second page:  Responding MD:  Dr Delane Ginger  Time MD responded:  [phone]

## 2013-05-08 NOTE — Progress Notes (Signed)
ANTIBIOTIC CONSULT NOTE - INITIAL  Pharmacy Consult for Levofloxacin and Vancomycin Indication: pneumonia  Allergies  Allergen Reactions  . Penicillins Other (See Comments)    Pt passes out.    Patient Measurements: Height: 5' 4.17" (163 cm) Weight: 104 lb 15 oz (47.6 kg) IBW/kg (Calculated) : 55.1 Adjusted Body Weight:   Vital Signs: Temp: 97.7 F (36.5 C) (12/22 1758) Temp src: Oral (12/22 1758) BP: 112/72 mmHg (12/22 1758) Pulse Rate: 108 (12/22 1758) Intake/Output from previous day:   Intake/Output from this shift:    Labs:  Recent Labs  05/08/13 1733  WBC 12.5*  HGB 13.2  PLT 370  CREATININE 0.57   Estimated Creatinine Clearance: 50.6 ml/min (by C-G formula based on Cr of 0.57). No results found for this basename: VANCOTROUGH, VANCOPEAK, VANCORANDOM, GENTTROUGH, GENTPEAK, GENTRANDOM, TOBRATROUGH, TOBRAPEAK, TOBRARND, AMIKACINPEAK, AMIKACINTROU, AMIKACIN,  in the last 72 hours   Microbiology: No results found for this or any previous visit (from the past 720 hour(s)).  Medical History: Past Medical History  Diagnosis Date  . Breast cancer     Medications:  Scheduled:  . enoxaparin (LOVENOX) injection  40 mg Subcutaneous Q24H  . ipratropium  0.5 mg Nebulization Q4H  . levalbuterol  0.63 mg Nebulization Q6H  . levofloxacin  750 mg Oral Daily  . nicotine  14 mg Transdermal Daily  . potassium chloride SA  40 mEq Oral Once  . potassium chloride SA  40 mEq Oral Once  . vancomycin  1,000 mg Intravenous Q24H   Assessment: 67 yr old female with a PMH including breast cancer and COPD, continuing to smoke. Her chief complaint was shortness of breath. She Was discharged on 12/16 for similar symptoms. Pharmacy to dose levofloxacin and vancomycin. At the time the levofloxacin was ordered, she did not have an IV so she was given an order for PO levofloxacin. Later, vancomycin and a saline lock were ordered.  Goal of Therapy:  Vancomycin trough level 15-20  mcg/ml  Plan:  1) Levofloxacin 750 mg PO daily 2) Vancomycin 1 Gm IV q24hr. 3) Vanc trough when appropriate.  Eugene Garnet 05/08/2013,8:13 PM

## 2013-05-08 NOTE — H&P (Signed)
Internal Medicine Attending Admission Note Date: 05/08/2013  Patient name: Kayla Tyler Medical record number: [id number] Date of birth: Aug 05, 1945 Age: 67 y.o. Gender: female  I saw and evaluated the patient. I reviewed the resident's note and I agree with the resident's findings and plan as documented in the resident's note, with the following additional comments.  Chief Complaint(s): Shortness of breath  History - key components related to admission: Patient is a 67 year old woman with history of breast cancer status post left mastectomy, tobacco abuse, COPD, hypertension, alcohol use, hyperlipidemia, aneurysmal dilatation of the ascending aorta and coronary artery disease noted on CT scan of the chest 06/28/2008, and other problems as outlined in the medical history, recently discharged from our service on 05/02/2013 following treatment for a COPD exacerbation, now admitted with complaint of shortness of breath.   Physical Exam - key components related to admission:  Filed Vitals:   05/08/13 1218 05/08/13 1500 05/08/13 1758  BP: 125/79  112/72  Pulse: 118  108  Temp: 97.8 F (36.6 C)  97.7 F (36.5 C)  TempSrc: Oral  Oral  Resp: 21  20  Height:  5' 4.17" (1.63 m)   Weight:  104 lb 15 oz (47.6 kg)   SpO2: 90%  93%   General: Alert, oriented Lungs: Mild tachypnea; left basilar crackles; no wheezes Heart: Regular; no extra sounds or murmurs Abdomen: Bowel sounds present, soft, nontender Extremities: No edema   Lab results:   Basic Metabolic Panel:  Recent Labs  40/98/11 1733  NA 133*  K 2.6*  CL 92*  CO2 27  GLUCOSE 95  BUN 8  CREATININE 0.57  CALCIUM 8.7    Liver Function Tests:  Recent Labs  05/08/13 1733  AST 52*  ALT 45*  ALKPHOS 317*  BILITOT 0.8  PROT 8.1  ALBUMIN 2.4*     CBC:  Recent Labs  05/08/13 1733  WBC 12.5*  HGB 13.2  HCT 36.4  MCV 103.1*  PLT 370    Recent Labs  05/08/13 1733  NEUTROABS 10.2*  LYMPHSABS 1.1  MONOABS  1.2*  EOSABS 0.0  BASOSABS 0.0       Urinalysis    Component Value Date/Time   COLORURINE YELLOW 06/28/2008 1100   APPEARANCEUR CLEAR 06/28/2008 1100   LABSPEC 1.010 06/28/2008 1100   PHURINE 5.0 06/28/2008 1100   GLUCOSEU NEGATIVE 06/28/2008 1100   HGBUR MODERATE* 06/28/2008 1100   BILIRUBINUR NEGATIVE 06/28/2008 1100   KETONESUR NEGATIVE 06/28/2008 1100   PROTEINUR NEGATIVE 06/28/2008 1100   UROBILINOGEN 0.2 06/28/2008 1100   NITRITE NEGATIVE 06/28/2008 1100   LEUKOCYTESUR SMALL* 06/28/2008 1100     Imaging results:  Dg Chest 2 View  05/08/2013   CLINICAL DATA:  COPD exacerbation with shortness of breath.  EXAM: CHEST  2 VIEW  COMPARISON:  Two-view chest x-ray and CTA chest 05/01/2013. Portable chest x-rays 04/30/2013, 02/02/2011.  FINDINGS: Interval development of patchy airspace opacities in the right middle lobe and left lower lobe since the examination 1 week ago. No airspace disease in the upper lobes. Hyperinflation and prominent bronchovascular markings diffusely with central peribronchial thickening, unchanged. No pleural effusions.  Cardiac silhouette normal in size, unchanged. Thoracic aorta tortuous and atherosclerotic, unchanged. Hilar and mediastinal contours otherwise unremarkable. Prior left mastectomy and axillary node dissection. Mild degenerative changes involving the thoracic spine. Generalized osseous demineralization.  IMPRESSION: Acute pneumonia involving the right middle lobe and the left lower lobe, superimposed upon COPD/emphysema.   Electronically Signed   By: Maisie Fus  Lawrence M.D.   On: 05/08/2013 13:48     Assessment & Plan by Problem:  1.  Pneumonia.  Plans include empiric IV antibiotic coverage with vancomycin and levofloxacin pending culture results; culture blood and sputum; treat COPD as below.  2.  COPD exacerbation.  Likely precipitated by pneumonia.  Plan is inhaled bronchodilators; supplement oxygen and follow saturations; treat pneumonia.  3.   Hypokalemia.  Plan is replace potassium and follow electrolytes.  4.  Other problems and plans as per the resident physician's note.

## 2013-05-08 NOTE — Addendum Note (Signed)
Addended by: Doneen Poisson D on: 05/08/2013 06:16 PM   Modules accepted: Level of Service

## 2013-05-08 NOTE — Progress Notes (Signed)
Report called to Nurse on [address].  Pt transported via wheel on 3 liters of O2 to room [address] 3.  Daughter accompanied pt to the floor.  Angelina Ok, RN 05/08/2013 12:23 PM.

## 2013-05-08 NOTE — H&P (Signed)
Date: 05/08/2013               Patient Name:  Kayla Tyler MRN: [id number]  DOB: [birth year] Age / Sex: 67 y.o., female   PCP: Christen Bame, MD         Medical Service: Internal Medicine Teaching Service         Attending Physician: Dr. Farley Ly, MD    First Contact: Dr. Boykin Peek, MD Pager: [phone]  Second Contact: Dr. Burr Medico Pager: [phone]       After Hours (After 5p/  First Contact Pager: [phone]  weekends / holidays): Second Contact Pager: [phone]    Chief Complaint: Shortness of breath      History of present illness: Pt is a 67 y.o. female with a PMH significant for PMH of breast cancer s/p left mastectomy and COPD (no PFTs on file) who presents from the [facility] with shortness of breath. She reports no improvement in her shortness of breath since she was discharged last week (05/02/13) for similar symptoms and was diagnosed with a COPD exacerbation.  She was discharged on home oxygen which she has been using mainly while ambulating or as needed.  She was also discharged on xopenex, dulera, prednisone, and spiriva.  She reports compliance with her medications except she has not picked up her spiriva.  She denies any recent sick contacts.  She reports a chronic dry cough but denies fever, chills, hemoptysis, CP, or palpitations. She reports significant alcohol use, drinking two large cans of Budweiser (possibly "tall boys"--24 oz each) nightly.  Unfortunately, she is still smoking daily (1 ppd).  She was discharged with a nicotine patch but just picked it up today.   Of note, TTE on 05/01/13 shows EF of 65-70% with PA pressure mildly increased. During her last admission, with ambulation her O2 saturation drops to 85%. She was therefore sent home with 2L of home O2 to follow up in the [facility] today.     Meds: Current Facility-Administered Medications  Medication Dose Route Frequency Provider Last Rate Last Dose  . acetaminophen (TYLENOL) tablet 650 mg  650 mg Oral Q6H PRN  Dow Adolph, MD       Or  . acetaminophen (TYLENOL) suppository 650 mg  650 mg Rectal Q6H PRN Dow Adolph, MD      . doxycycline (VIBRA-TABS) tablet 100 mg  100 mg Oral Q12H Dow Adolph, MD      . enoxaparin (LOVENOX) injection 40 mg  40 mg Subcutaneous Q24H Dow Adolph, MD      . levalbuterol [facility] HFA) inhaler 2 puff  2 puff Inhalation Q4H PRN Dow Adolph, MD      . levofloxacin [facility]) tablet 750 mg  750 mg Oral Daily Farley Ly, MD      . mometasone-formoterol [facility]) 200-5 MCG/ACT inhaler 2 puff  2 puff Inhalation BID Dow Adolph, MD      . nicotine (NICODERM CQ - dosed in mg/24 hours) patch 14 mg  14 mg Transdermal Daily Dow Adolph, MD      . Melene Muller ON 05/09/2013] tiotropium (SPIRIVA) inhalation capsule 18 mcg  18 mcg Inhalation Daily Dow Adolph, MD        Prescriptions prior to admission  Medication Sig Dispense Refill  . Aspirin-Acetaminophen-Caffeine (GOODY HEADACHE PO) Take 1 packet by mouth daily as needed (for body pain).      Marland Kitchen doxycycline (VIBRA-TABS) 100 MG tablet Take 1 tablet (100 mg total) by mouth every 12 (twelve) hours.  10 tablet  0  . levalbuterol (XOPENEX HFA) 45 MCG/ACT inhaler Inhale 2 puffs into the lungs every 4 (four) hours as needed for wheezing.  1 Inhaler  12  . mometasone-formoterol (DULERA) 200-5 MCG/ACT AERO Inhale 2 puffs into the lungs 2 (two) times daily.  1 Inhaler  11  . nicotine (NICODERM CQ - DOSED IN MG/24 HOURS) 14 mg/24hr patch Place 1 patch (14 mg total) onto the skin daily.  28 patch  0  . tiotropium (SPIRIVA HANDIHALER) 18 MCG inhalation capsule Place 1 capsule (18 mcg total) into inhaler and inhale daily.  30 capsule  12  . predniSONE (DELTASONE) 20 MG tablet Take 2 tablets (40 mg total) by mouth daily with breakfast.  10 tablet  0    Allergies: Allergies as of 05/08/2013 - Review Complete 05/08/2013  Allergen Reaction Noted  . Penicillins Other (See Comments) 04/30/2013    PMH: Past Medical  History  Diagnosis Date  . Breast cancer     PSH: Past Surgical History  Procedure Laterality Date  . Mastectomy Left     FH: History reviewed. No pertinent family history.  SH: History   Social History  . Marital Status: Widowed    Spouse Name: N/A    Number of Children: N/A  . Years of Education: N/A   Occupational History  . Not on file.   Social History Main Topics  . Smoking status: Current Every Day Smoker -- 1.00 packs/day for 48 years  . Smokeless tobacco: Never Used  . Alcohol Use: 7.2 oz/week    12 Cans of beer per week  . Drug Use: No  . Sexual Activity: Not on file   Other Topics Concern  . Not on file   Social History Narrative  . No narrative on file    Review of Systems: Pertinent items are noted in HPI.  Physical Exam: Blood pressure 125/79, pulse 118, temperature 97.8 F (36.6 C), temperature source Oral, resp. rate 21, height 5' 4.17" (1.63 m), weight 47.6 kg (104 lb 15 oz), SpO2 90.00%.  Physical Exam  Constitutional: She is oriented to person, place, and time. No distress.  HENT:  Head: Normocephalic and atraumatic.  Eyes: EOM are normal. Pupils are equal, round, and reactive to light.  Neck: Neck supple.  Cardiovascular: Regular rhythm, normal heart sounds and intact distal pulses.   Pulmonary/Chest: No accessory muscle usage. Tachypnea noted. No respiratory distress. She has no wheezes. She has rales in the left lower field. She exhibits no tenderness.  Abdominal: Soft. Bowel sounds are normal. She exhibits no distension. There is no tenderness.  Lymphadenopathy:    She has no cervical adenopathy.  Neurological: She is alert and oriented to person, place, and time.  Skin: Skin is warm and dry. No rash noted. She is not diaphoretic.  Psychiatric: Her mood appears anxious.    Lab results:  Basic Metabolic Panel: No results found for this basename: NA, K, CL, CO2, GLUCOSE, BUN, CREATININE, CALCIUM, MG, PHOS,  in the last 72  hours Anion Gap:  Liver Function Tests: No results found for this basename: AST, ALT, ALKPHOS, BILITOT, PROT, ALBUMIN,  in the last 72 hours No results found for this basename: LIPASE, AMYLASE,  in the last 72 hours No results found for this basename: AMMONIA,  in the last 72 hours  CBC: Lab Results  Component Value Date   WBC 5.5 04/30/2013   HGB 14.2 04/30/2013   HCT 40.0 04/30/2013   MCV 104.7* 04/30/2013  PLT 169 04/30/2013    Cardiac Enzymes: No results found for this basename: TROPIPOC,  in the last 72 hours Lab Results  Component Value Date   CKTOTAL 85 06/29/2008   CKMB 2.3 06/29/2008   TROPONINI <0.30 05/01/2013    BNP: No results found for this basename: PROBNP,  in the last 72 hours  D-Dimer: No results found for this basename: DDIMER,  in the last 72 hours  CBG: No results found for this basename: GLUCAP,  in the last 72 hours  Hemoglobin A1C: No results found for this basename: HGBA1C,  in the last 72 hours  Lipid Panel: No results found for this basename: CHOL, HDL, LDLCALC, TRIG, CHOLHDL, LDLDIRECT,  in the last 72 hours  Thyroid Function Tests: No results found for this basename: TSH, T4TOTAL, FREET4, T3FREE, THYROIDAB,  in the last 72 hours  Anemia Panel: No results found for this basename: VITAMINB12, FOLATE, FERRITIN, TIBC, IRON, RETICCTPCT,  in the last 72 hours  Coagulation: No results found for this basename: LABPROT, INR,  in the last 72 hours  Urine Drug Screen: Drugs of Abuse:  No results found for this basename: labopia,  cocainscrnur,  labbenz,  amphetmu,  thcu,  labbarb    Alcohol Level: No results found for this basename: ETH,  in the last 72 hours  Urinalysis: No results found for this basename: COLORURINE, APPERANCEUR, LABSPEC, PHURINE, GLUCOSEU, HGBUR, BILIRUBINUR, KETONESUR, PROTEINUR, UROBILINOGEN, NITRITE, LEUKOCYTESUR,  in the last 72 hours  Imaging results:  Dg Chest 2 View  05/08/2013   CLINICAL DATA:  COPD  exacerbation with shortness of breath.  EXAM: CHEST  2 VIEW  COMPARISON:  Two-view chest x-ray and CTA chest 05/01/2013. Portable chest x-rays 04/30/2013, 02/02/2011.  FINDINGS: Interval development of patchy airspace opacities in the right middle lobe and left lower lobe since the examination 1 week ago. No airspace disease in the upper lobes. Hyperinflation and prominent bronchovascular markings diffusely with central peribronchial thickening, unchanged. No pleural effusions.  Cardiac silhouette normal in size, unchanged. Thoracic aorta tortuous and atherosclerotic, unchanged. Hilar and mediastinal contours otherwise unremarkable. Prior left mastectomy and axillary node dissection. Mild degenerative changes involving the thoracic spine. Generalized osseous demineralization.  IMPRESSION: Acute pneumonia involving the right middle lobe and the left lower lobe, superimposed upon COPD/emphysema.   Electronically Signed   By: Hulan Saas M.D.   On: 05/08/2013 13:48    EKG:  Date/Time:    Ventricular Rate:    PR Interval:    QRS Duration:   QT Interval:    QTC Calculation:   R Axis:     Text Interpretation:     Antibiotics: Antibiotics Given (last 72 hours)   None      Anti-infectives   Start     Dose/Rate Route Frequency Ordered Stop   05/08/13 1800  doxycycline (VIBRA-TABS) tablet 100 mg     100 mg Oral Every 12 hours 05/08/13 1617     05/08/13 1700  levofloxacin (LEVAQUIN) tablet 750 mg     750 mg Oral Daily 05/08/13 1647       Consults:    Assessment & Plan by Problem: Principal Problem:   COPD exacerbation Active Problems:   HYPERLIPIDEMIA   HEPATIC CIRRHOSIS, NONALCOHOLIC   Excessive drinking alcohol   Pt is a 67 y.o. female with a PMH significant for breast cancer s/p left mastectomy and COPD (no PFTs on file) who presents from the [facility] with shortness of breath.  #HCAP- Pt presents with continued SOB  after a recent COPD exacerbation.  She denies any fever/chills,  N/V/D/C, CP, or wheezing.  Pt CXR shows acute pneumonia involving the RML and LLL, superimposed upon COPD/emphysema.   -cbc/diff, CMP, BC, sputum cx -levalbuterol 0.63 mg neb q4 hrs  -Ipratropium 0.5 mg neb q4 hrs  -O2 as needed  -levofloxacin 750mg  po qd -vancomycin per pharmacy  Chronic Hyponatremia  Upon viewing records, pt has experienced hyponatremia in the past.  Appears euvolemic on exam. Stable blood pressure so not likely adrenal deficiency. No signs of hypothyroidism on exam. 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 HIV negative. TSH wnl.  -CMP  #Chronic Macrocytosis  Pt last MCV was 104.7.  Most likely d/t alcohol abuse.    #Alcohol abuse  The patient reports drinking "2 tall cans of Budweiser" per day, which could range anywhere from normal 12 oz cans (24 oz total) to 24 oz "tall boys" (48 oz total).  -CIWA  #Nicotine dependence  -Nicotine patch 14 mg  -Counseled patient about quitting tobacco   #FEN- Diet regular  #VTE prophylaxis- lovenox 40mg  SQ qd   #Dispo- Disposition is deferred at this time, awaiting improvement of current medical problems.  Anticipated discharge in approximately 1-2 day(s).    The patient does have a current PCP Christen Bame, MD) and does need an [facility] hospital follow-up appointment after discharge.  Signed: Boykin Peek, MD 05/08/2013, 5:15 PM

## 2013-05-08 NOTE — Progress Notes (Signed)
05/08/2013 patient direct admit to [address], she is alert, oriented and ambulatory. Patient came to floor she was sitting on side of bed. She does get short of breath on exertion and is on continuous oxygen. Patient skin is fine, heels are dry. She reported she had falling at home last week and was placed on bed alarm. Patient is on telemetry and she is sinus tach. She have pink arm band on left arm, because of mastectomy. [facility] RN.

## 2013-05-08 NOTE — Progress Notes (Signed)
   Subjective:    Patient ID: Kayla Tyler, female    DOB: 12-26-1945, 67 y.o.   MRN: [id number]  Shortness of Breath Associated symptoms include wheezing. Pertinent negatives include no abdominal pain, chest pain, fever, headaches, leg swelling or vomiting.    Kayla Tyler is a 67 yo woman pmh of HLD and hepatic cirrhosis and presumed COPD not confirmed by PFTs presents for a hospital follow up. Pt was recently d/c on 05/02/13 with prednisone taper, spiriva, and buelliro inhalers, and doxycycline. She states that since that time she only feels about 10% improved despite having completed her prednisone taper and only 20-30% from her pre-hospitalization baseline. She has gone through 3 inhalers and needs to use them every 20 min for relief of significant dyspnea. She is unable to ambulate without significant dyspnea. She has ongoing productive cough and has to use 2L O2 at home which is new since hospitalization. She continues to smoke usually 1ppd but has only been able to smoke 0.5ppd given significant dyspnea not because of desire to cut back. She has been supplementing with nicotine patches.   Review of Systems  Constitutional: Positive for fatigue. Negative for fever, chills, diaphoresis and appetite change.  HENT: Negative for congestion.   Respiratory: Positive for cough (productive of yellow sputum), chest tightness, shortness of breath and wheezing.   Cardiovascular: Negative for chest pain, palpitations and leg swelling.  Gastrointestinal: Negative for nausea, vomiting, abdominal pain and diarrhea.  Genitourinary: Negative for dysuria and urgency.  Musculoskeletal: Negative for arthralgias and myalgias.  Neurological: Negative for dizziness, weakness and headaches.       Objective:   Physical Exam Filed Vitals:   05/08/13 1053  BP: 116/76  Pulse: 122  Temp: 97 F (36.1 C)   General: sitting in wheelchair in some respiratory distress, cachectic HEENT: PERRL, EOMI, no scleral  icterus Cardiac: tachy, RR, no rubs, murmurs or gallops Pulm: rapid shallow breathing, some scalene muscle use, moving decreased volumes of air, poor BS Abd: soft, nontender, nondistended, BS present Ext: warm and well perfused, no pedal edema Neuro: alert and oriented X3, cranial nerves II-XII grossly intact      Assessment & Plan:  1. Dyspnea with hypoxia : pt is presenting with respiratory distress and moving poor air along with tachycardia and hypoxia. Therefore she was directly admitted for further evaluation and treatment. She continues to have increase in dyspnea and cough despite treatment for copd exacerbation.   Pt was discussed with Dr. Josem Kaufmann.

## 2013-05-08 NOTE — Progress Notes (Signed)
Case discussed with Dr. Sadek at the time of the visit.  We reviewed the resident's history and exam and pertinent patient test results.  I agree with the assessment, diagnosis and plan of care documented in the resident's note. 

## 2013-05-09 LAB — COMPREHENSIVE METABOLIC PANEL
ALT: 34 U/L (ref 0–35)
AST: 38 U/L — ABNORMAL HIGH (ref 0–37)
Alkaline Phosphatase: 277 U/L — ABNORMAL HIGH (ref 39–117)
BUN: 5 mg/dL — ABNORMAL LOW (ref 6–23)
CO2: 26 mEq/L (ref 19–32)
Calcium: 8.3 mg/dL — ABNORMAL LOW (ref 8.4–10.5)
Chloride: 98 mEq/L (ref 96–112)
Creatinine, Ser: 0.59 mg/dL (ref 0.50–1.10)
GFR calc non Af Amer: >90 mL/min (ref >90)
Glucose, Bld: 87 mg/dL (ref 70–99)
Sodium: 133 mEq/L — ABNORMAL LOW (ref 135–145)
Total Bilirubin: 0.6 mg/dL (ref 0.3–1.2)

## 2013-05-09 MED ORDER — POTASSIUM CHLORIDE CRYS ER 20 MEQ PO TBCR
20.0000 meq | EXTENDED_RELEASE_TABLET | Freq: Two times a day (BID) | ORAL | Status: DC
  Filled 2013-05-09 (×2): qty 1

## 2013-05-09 MED ORDER — IPRATROPIUM BROMIDE 0.02 % IN SOLN
0.5000 mg | Freq: Three times a day (TID) | RESPIRATORY_TRACT | Status: DC
  Administered 2013-05-09 – 2013-05-10 (×3): 0.5 mg via RESPIRATORY_TRACT
  Filled 2013-05-09 (×3): qty 2.5

## 2013-05-09 MED ORDER — LEVALBUTEROL HCL 0.63 MG/3ML IN NEBU
0.6300 mg | INHALATION_SOLUTION | Freq: Three times a day (TID) | RESPIRATORY_TRACT | Status: DC
  Administered 2013-05-09 – 2013-05-10 (×3): 0.63 mg via RESPIRATORY_TRACT
  Filled 2013-05-09 (×6): qty 3

## 2013-05-09 MED ORDER — IPRATROPIUM BROMIDE 0.02 % IN SOLN: 0.5000 mg | Freq: Four times a day (QID) | RESPIRATORY_TRACT | Status: DC

## 2013-05-09 NOTE — Clinical Documentation Improvement (Signed)
THIS DOCUMENT IS NOT A PERMANENT PART OF THE MEDICAL RECORD  Please update your documentation with the medical record to reflect your response to this query. If you need help please call [phone].  05/09/13  Dear Associates,  In a better effort to capture your patient's severity of illness, reflect appropriate length of stay and utilization of resources, a review of the patient medical record has revealed the following indicators. Based on your clinical judgment, please clarify and document in a progress note and/or discharge summary the clinical condition associated with the following supporting information: In responding to this query please exercise your independent judgment.  The fact that a query is asked, does not imply that any particular answer is desired or expected.  Possible Clinical Conditions?  Acute on Chronic Respiratory Failure  Chronic Respiratory Failure  Other Condition  Cannot Clinically Determine   Supporting Information:  Risk Factors:tobacco abuse; recent discharge 05/02/2013 following treatment for a COPD exacerbation, now admitted with complaint of shortness of breath  Signs&Symptoms: 12/22: "She does get short of breath on exertion and is on continuous oxygen." 12/23 progress note: "She is sitting up in bed on 3L O2 [place]."  12/22 "admitted with complaint of shortness of breath" H&P: During her last admission, with ambulation her O2 saturation drops to 85%. She was therefore sent home with 2L of home O2 to follow up in the [facility] today. Tachypnea noted. She has no wheezes. She has rales in the left lower field. anxious. Respiratory: Positive for cough (productive of yellow sputum), chest tightness, shortness of breath and wheezing. General: sitting in wheelchair in some respiratory distress, cachectic  Cardiac: tachy, RR, no rubs, murmurs or gallops Pulm: rapid shallow breathing, some scalene muscle use, moving decreased volumes of air, poor BS. 1. Dyspnea with  hypoxia : pt is presenting with respiratory distress and moving poor air along with tachycardia and hypoxia. Therefore she was directly admitted for further evaluation and treatment. She continues to have increase in dyspnea and cough despite treatment for copd exacerbation. P. Ox: 88%; RR: 21  Diagnostics:  Radiology: CLINICAL DATA: COPD exacerbation with shortness of breath. EXAM: CHEST 2 VIEW COMPARISON: Two-view chest x-ray and CTA chest 05/01/2013. Portable chest x-rays 04/30/2013, 02/02/2011. FINDINGS: Interval development of patchy airspace opacities in the right middle lobe and left lower lobe since the examination 1 week ago. No airspace disease in the upper lobes. Hyperinflation and prominent bronchovascular markings diffusely with central peribronchial thickening, unchanged. No pleural effusions. Cardiac silhouette normal in size, unchanged. Thoracic aorta tortuous and atherosclerotic, unchanged. Hilar and mediastinal contours otherwise unremarkable. Prior left mastectomy and axillary node dissection. Mild degenerative changes involving the thoracic spine. Generalized osseous demineralization. IMPRESSION: Acute pneumonia involving the right middle lobe and the left lower lobe, superimposed upon COPD/emphysema. Electronically Signed By: Hulan Saas M.D. On: 05/08/2013 13:48  Treatment:  O2 concentrations/O2 Mode: 3 l/m [place]  Respiratory Treatment: levalbuterol 0.63 mg neb q4 hrs   You may use possible, probable, or suspect with inpatient documentation. possible, probable, suspected diagnoses MUST be documented at the time of discharge  Reviewed:  no additional documentation provided  Thank You,  Amada Kingfisher RN, BSN, CCM  Clinical Documentation Specialist: Health Information Management [email or web address] [phone]  Tips and Tricks for How to Complete CDI Queries: 1. Access Hospital Chart completion folder in your [place]. 2. Select the CDI query  deficiency.. 3. Review note from CDI in the inbasket report. 4. If needed, update documentation for pts encounter via notes  activity. 5. After updating or not, access your deficiency again and click edit on the [employer]. 6. Click F2 to complete all required fields on review. 7. Sign note.  8. The deficiency will fall out of your InBasket.

## 2013-05-09 NOTE — Progress Notes (Signed)
Subjective:   Pt reports feeling better this AM.  She is sitting up in bed on 3L O2 [place].  She is eating breakfast.  She is able to walk to the bathroom on oxygen without becoming SOB.     Objective:    Vital signs in last 24 hours: Filed Vitals:   05/08/13 2204 05/09/13 0540 05/09/13 0835 05/09/13 1011  BP:  109/75  99/61  Pulse:  96  107  Temp:  98.7 F (37.1 C)  98 F (36.7 C)  TempSrc:  Oral    Resp:  20  18  Height:      Weight:      SpO2: 96% 95% 99% 100%   Weight change:   Intake/Output Summary (Last 24 hours) at 05/09/13 1230 Last data filed at 05/09/13 1012  Gross per 24 hour  Intake   1000 ml  Output    100 ml  Net    900 ml   Physical Exam: Constitutional: Vital signs reviewed.  Patient is in no acute distress and cooperative with exam.   Head: Normocephalic and atraumatic Eyes: PERRL, EOMI, conjunctivae normal, no scleral icterus  Neck: Supple, Trachea midline Cardiovascular: RRR, S1, S2 present, no MRG, DP 2+ b/l Pulmonary/Chest: normal respiratory effort, rales b/l at the bases, no wheezes or rhonchi Abdominal: Soft. +BS, Non-tender, non-distended Musculoskeletal: No joint deformities, erythema, or stiffness Neurological: A&O x3, cranial nerve II-XII are grossly intact, moving all extremities  Skin: Warm, dry and intact. No rash Psychiatric: Normal mood and affect.   Lab Results:  BMP:  Recent Labs Lab 05/08/13 1733 05/09/13 0320  NA 133* 133*  K 2.6* 3.5  CL 92* 98  CO2 27 26  GLUCOSE 95 87  BUN 8 5*  CREATININE 0.57 0.59  CALCIUM 8.7 8.3*  MG 1.7  --     CBC:  Recent Labs Lab 05/08/13 1733  WBC 12.5*  NEUTROABS 10.2*  HGB 13.2  HCT 36.4  MCV 103.1*  PLT 370   LFTs:  Recent Labs Lab 05/08/13 1733 05/09/13 0320  AST 52* 38*  ALT 45* 34  ALKPHOS 317* 277*  BILITOT 0.8 0.6  PROT 8.1 6.6  ALBUMIN 2.4* 1.9*   Cardiac Enzymes: No results found for this basename: CKTOTAL, CKMB, CKMBINDEX, TROPONINI,  in the last 168  hours Lab Results  Component Value Date   CKTOTAL 85 06/29/2008   CKMB 2.3 06/29/2008   TROPONINI <0.30 05/01/2013   Micro Results: Recent Results (from the past 240 hour(s))  CULTURE, BLOOD (ROUTINE X 2)     Status: None   Collection Time    05/08/13  5:33 PM      Result Value Range Status   Specimen Description BLOOD RIGHT ARM   Final   Special Requests BOTTLES DRAWN AEROBIC AND ANAEROBIC 10CC   Final   Culture  Setup Time     Final   Value: 05/08/2013 23:38     Performed at [facility]   Culture     Final   Value:        BLOOD CULTURE RECEIVED NO GROWTH TO DATE CULTURE WILL BE HELD FOR 5 DAYS BEFORE ISSUING A FINAL NEGATIVE REPORT     Performed at [facility]   Report Status PENDING   Incomplete  CULTURE, BLOOD (ROUTINE X 2)     Status: None   Collection Time    05/08/13  5:38 PM      Result Value Range Status  Specimen Description BLOOD RIGHT FOREARM   Final   Special Requests BOTTLES DRAWN AEROBIC ONLY 10CC   Final   Culture  Setup Time     Final   Value: 05/08/2013 23:38     Performed at [facility]   Culture     Final   Value:        BLOOD CULTURE RECEIVED NO GROWTH TO DATE CULTURE WILL BE HELD FOR 5 DAYS BEFORE ISSUING A FINAL NEGATIVE REPORT     Performed at [facility]   Report Status PENDING   Incomplete    Blood Culture:    Component Value Date/Time   SDES BLOOD RIGHT FOREARM 05/08/2013 1738   SPECREQUEST BOTTLES DRAWN AEROBIC ONLY 10CC 05/08/2013 1738   CULT  Value:        BLOOD CULTURE RECEIVED NO GROWTH TO DATE CULTURE WILL BE HELD FOR 5 DAYS BEFORE ISSUING A FINAL NEGATIVE REPORT Performed at [facility] 05/08/2013 1738   REPTSTATUS PENDING 05/08/2013 1738    Studies/Results: Dg Chest 2 View  05/08/2013   CLINICAL DATA:  COPD exacerbation with shortness of breath.  EXAM: CHEST  2 VIEW  COMPARISON:  Two-view chest x-ray and CTA chest 05/01/2013. Portable chest x-rays 04/30/2013, 02/02/2011.  FINDINGS: Interval  development of patchy airspace opacities in the right middle lobe and left lower lobe since the examination 1 week ago. No airspace disease in the upper lobes. Hyperinflation and prominent bronchovascular markings diffusely with central peribronchial thickening, unchanged. No pleural effusions.  Cardiac silhouette normal in size, unchanged. Thoracic aorta tortuous and atherosclerotic, unchanged. Hilar and mediastinal contours otherwise unremarkable. Prior left mastectomy and axillary node dissection. Mild degenerative changes involving the thoracic spine. Generalized osseous demineralization.  IMPRESSION: Acute pneumonia involving the right middle lobe and the left lower lobe, superimposed upon COPD/emphysema.   Electronically Signed   By: Hulan Saas M.D.   On: 05/08/2013 13:48    Medications:  Scheduled Meds: . enoxaparin (LOVENOX) injection  40 mg Subcutaneous Q24H  . ipratropium  0.5 mg Nebulization Q4H  . levalbuterol  0.63 mg Nebulization Q6H  . levofloxacin  750 mg Oral Daily  . nicotine  14 mg Transdermal Daily  . vancomycin  1,000 mg Intravenous Q24H   Continuous Infusions:  PRN Meds:.acetaminophen, acetaminophen  Antibiotics: Anti-infectives   Start     Dose/Rate Route Frequency Ordered Stop   05/08/13 2000  vancomycin (VANCOCIN) IVPB 1000 mg/200 mL premix     1,000 mg 200 mL/hr over 60 Minutes Intravenous Every 24 hours 05/08/13 1912     05/08/13 1800  doxycycline (VIBRA-TABS) tablet 100 mg  Status:  Discontinued     100 mg Oral Every 12 hours 05/08/13 1617 05/08/13 1812   05/08/13 1700  levofloxacin (LEVAQUIN) tablet 750 mg     750 mg Oral Daily 05/08/13 1647       Antibiotics Given (last 72 hours)   Date/Time Action Medication Dose Rate   05/08/13 1831 Given   levofloxacin (LEVAQUIN) tablet 750 mg 750 mg    05/08/13 2124 Given   vancomycin (VANCOCIN) IVPB 1000 mg/200 mL premix 1,000 mg 200 mL/hr   05/09/13 1032 Given   levofloxacin (LEVAQUIN) tablet 750 mg 750 mg         Day of Hospitalization:  1 Consults:    Assessment/Plan:   Principal Problem:   HCAP (healthcare-associated pneumonia) Active Problems:   HYPERLIPIDEMIA   HEPATIC CIRRHOSIS, NONALCOHOLIC   Excessive drinking alcohol   COPD exacerbation  #  HCAP  Pt presents with continued SOB after a recent COPD exacerbation. She denies any fever/chills, N/V/D/C, CP, or wheezing. Pt CXR shows acute pneumonia involving the RML and LLL, superimposed upon COPD/emphysema.  Today, she is feeling better.   -BC NGTD; sputum cx pending -levalbuterol 0.63 mg neb q4 hrs  -Ipratropium 0.5 mg neb q4 hrs  -O2 as needed  -levofloxacin 750mg  po qd  -vancomycin per pharmacy   #Chronic Hyponatremia  Upon viewing records, pt has experienced hyponatremia in the past. Appears euvolemic on exam. Stable blood pressure so not likely adrenal deficiency. No signs of hypothyroidism on exam. 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 HIV negative. TSH wnl.   #Chronic Macrocytosis  Pt last MCV was 104.7. Most likely d/t alcohol abuse.   #Alcohol abuse  The patient reports drinking "2 tall cans of Budweiser" per day, which could range anywhere from normal 12 oz cans (24 oz total) to 24 oz "tall boys" (48 oz total).  -CIWA   #Nicotine dependence  -Nicotine patch 14 mg  -Counseled patient about quitting tobacco   #FEN- Diet regular   #VTE prophylaxis- lovenox 40mg  SQ qd    #Dispo- Disposition is deferred at this time, awaiting improvement of current medical problems.  Anticipated discharge in approximately 1-2 day(s).    LOS: 1 day   Boykin Peek, MD 05/09/2013, 12:30 PM

## 2013-05-09 NOTE — Progress Notes (Signed)
Internal Medicine Attending  Date: 05/09/2013  Patient name: Kayla Tyler Medical record number: [id number] Date of birth: February 13, 1946 Age: 67 y.o. Gender: female  I saw and evaluated the patient and discussed her care with house staff. I reviewed the resident's note by Dr. Delane Ginger and I agree with the resident's findings and plans as documented in her note.

## 2013-05-09 NOTE — Discharge Summary (Signed)
I repeated the critical or key portions of the exam.  I confirmed/revised the medical student's history, exam, assessment and plan.   

## 2013-05-10 LAB — EXPECTORATED SPUTUM ASSESSMENT W GRAM STAIN, RFLX TO RESP C

## 2013-05-10 LAB — CBC
HCT: 30.9 % — ABNORMAL LOW (ref 36.0–46.0)
MCV: 104 fL — ABNORMAL HIGH (ref 78.0–100.0)
RBC: 2.97 MIL/uL — ABNORMAL LOW (ref 3.87–5.11)
WBC: 10.2 10*3/uL (ref 4.0–10.5)

## 2013-05-10 LAB — BASIC METABOLIC PANEL
CO2: 24 mEq/L (ref 19–32)
Chloride: 99 mEq/L (ref 96–112)
GFR calc non Af Amer: >90 mL/min (ref >90)
Glucose, Bld: 88 mg/dL (ref 70–99)
Potassium: 4.4 mEq/L (ref 3.5–5.1)
Sodium: 132 mEq/L — ABNORMAL LOW (ref 135–145)

## 2013-05-10 LAB — STREP PNEUMONIAE URINARY ANTIGEN: Strep Pneumo Urinary Antigen: POSITIVE — AB

## 2013-05-10 MED ORDER — LEVOFLOXACIN 750 MG PO TABS: 750.0000 mg | ORAL_TABLET | Freq: Every day | ORAL | Status: DC

## 2013-05-10 MED ORDER — POTASSIUM CHLORIDE CRYS ER 10 MEQ PO TBCR
10.0000 meq | EXTENDED_RELEASE_TABLET | Freq: Two times a day (BID) | ORAL | Status: DC
  Filled 2013-05-10: qty 1

## 2013-05-10 NOTE — Progress Notes (Signed)
Ambulated in hall with Oxygen at 3L/nasal cannula. O2 Sats 88%-99%; HR 117-92; BP 118/76-112/78. Total distance about 100 feet, tolerated well but did not want to go any further. (walks with a cane and one person support.) Kayla Tyler

## 2013-05-10 NOTE — Progress Notes (Signed)
   CARE MANAGEMENT NOTE 05/10/2013  Patient:  Kayla Tyler, Kayla Tyler   Account Number:  [id number]  Date Initiated:  05/10/2013  Documentation initiated by:  Darlyne Russian  Subjective/Objective Assessment:   admitted with COPD exacerbation  home O2 from [facility]     Action/Plan:   progression of care and discharge planning   Anticipated DC Date:  05/12/2013   Anticipated DC Plan:           Choice offered to / List presented to:             Status of service:  In process, will continue to follow Medicare Important Message given?   (If response is "NO", the following Medicare IM given date fields will be blank) Date Medicare IM given:   Date Additional Medicare IM given:    Discharge Disposition:    Per UR Regulation:    If discussed at Long Length of Stay Meetings, dates discussed:    Comments:

## 2013-05-10 NOTE — Progress Notes (Addendum)
Subjective:   Pt reports feeling better this AM.  She is eating breakfast.  She states her breathing is better and would like to go home.   Objective:    Vital signs in last 24 hours: Filed Vitals:   05/09/13 2154 05/10/13 0511 05/10/13 0804 05/10/13 0849  BP: 104/74 111/74  125/76  Pulse: 103 87  98  Temp: 98.3 F (36.8 C) 98.6 F (37 C)  98.5 F (36.9 C)  TempSrc: Oral Oral    Resp: 18 19  17   Height:      Weight: 47.599 kg (104 lb 15 oz) 49.533 kg (109 lb 3.2 oz)    SpO2: 100% 99% 99% 98%   Weight change: -0.001 kg (-0 oz)  Intake/Output Summary (Last 24 hours) at 05/10/13 1015 Last data filed at 05/10/13 0849  Gross per 24 hour  Intake   1020 ml  Output    100 ml  Net    920 ml   Physical Exam: Constitutional: Vital signs reviewed.  Patient is in no acute distress and cooperative with exam.   Head: Normocephalic and atraumatic Eyes: PERRL, EOMI, conjunctivae normal, no scleral icterus  Neck: Supple, Trachea midline Cardiovascular: RRR, S1, S2 present, no MRG, DP 2+ b/l Pulmonary/Chest: on O2; normal respiratory effort, mild rales b/l at the bases (improved from yesterday), no wheezes or rhonchi Abdominal: Soft. +BS, Non-tender, non-distended Musculoskeletal: No joint deformities, erythema, or stiffness Neurological: A&O x3, cranial nerve II-XII are grossly intact, moving all extremities  Skin: Warm, dry and intact. No rash  Lab Results:  BMP:  Recent Labs Lab 05/08/13 1733 05/09/13 0320 05/10/13 0354  NA 133* 133* 132*  K 2.6* 3.5 4.4  CL 92* 98 99  CO2 27 26 24   GLUCOSE 95 87 88  BUN 8 5* 5*  CREATININE 0.57 0.59 0.58  CALCIUM 8.7 8.3* 8.4  MG 1.7  --   --     CBC:  Recent Labs Lab 05/08/13 1733 05/10/13 0354  WBC 12.5* 10.2  NEUTROABS 10.2*  --   HGB 13.2 10.7*  HCT 36.4 30.9*  MCV 103.1* 104.0*  PLT 370 356   LFTs:  Recent Labs Lab 05/08/13 1733 05/09/13 0320  AST 52* 38*  ALT 45* 34  ALKPHOS 317* 277*  BILITOT 0.8  0.6  PROT 8.1 6.6  ALBUMIN 2.4* 1.9*   Cardiac Enzymes: No results found for this basename: CKTOTAL, CKMB, CKMBINDEX, TROPONINI,  in the last 168 hours Lab Results  Component Value Date   CKTOTAL 85 06/29/2008   CKMB 2.3 06/29/2008   TROPONINI <0.30 05/01/2013   Micro Results: Recent Results (from the past 240 hour(s))  CULTURE, BLOOD (ROUTINE X 2)     Status: None   Collection Time    05/08/13  5:33 PM      Result Value Range Status   Specimen Description BLOOD RIGHT ARM   Final   Special Requests BOTTLES DRAWN AEROBIC AND ANAEROBIC 10CC   Final   Culture  Setup Time     Final   Value: 05/08/2013 23:38     Performed at [facility]   Culture     Final   Value:        BLOOD CULTURE RECEIVED NO GROWTH TO DATE CULTURE WILL BE HELD FOR 5 DAYS BEFORE ISSUING A FINAL NEGATIVE REPORT     Performed at [facility]   Report Status PENDING   Incomplete  CULTURE, BLOOD (ROUTINE X 2)  Status: None   Collection Time    05/08/13  5:38 PM      Result Value Range Status   Specimen Description BLOOD RIGHT FOREARM   Final   Special Requests BOTTLES DRAWN AEROBIC ONLY 10CC   Final   Culture  Setup Time     Final   Value: 05/08/2013 23:38     Performed at [facility]   Culture     Final   Value:        BLOOD CULTURE RECEIVED NO GROWTH TO DATE CULTURE WILL BE HELD FOR 5 DAYS BEFORE ISSUING A FINAL NEGATIVE REPORT     Performed at [facility]   Report Status PENDING   Incomplete  CULTURE, EXPECTORATED SPUTUM-ASSESSMENT     Status: None   Collection Time    05/10/13  8:38 AM      Result Value Range Status   Specimen Description SPUTUM   Final   Special Requests Normal   Final   Sputum evaluation     Final   Value: THIS SPECIMEN IS ACCEPTABLE. RESPIRATORY CULTURE REPORT TO FOLLOW.   Report Status 05/10/2013 FINAL   Final    Blood Culture:    Component Value Date/Time   SDES SPUTUM 05/10/2013 0838   SPECREQUEST Normal 05/10/2013 0838   CULT  Value:         BLOOD CULTURE RECEIVED NO GROWTH TO DATE CULTURE WILL BE HELD FOR 5 DAYS BEFORE ISSUING A FINAL NEGATIVE REPORT Performed at [facility] 05/08/2013 1738   REPTSTATUS 05/10/2013 FINAL 05/10/2013 0838    Studies/Results: Dg Chest 2 View  05/08/2013   CLINICAL DATA:  COPD exacerbation with shortness of breath.  EXAM: CHEST  2 VIEW  COMPARISON:  Two-view chest x-ray and CTA chest 05/01/2013. Portable chest x-rays 04/30/2013, 02/02/2011.  FINDINGS: Interval development of patchy airspace opacities in the right middle lobe and left lower lobe since the examination 1 week ago. No airspace disease in the upper lobes. Hyperinflation and prominent bronchovascular markings diffusely with central peribronchial thickening, unchanged. No pleural effusions.  Cardiac silhouette normal in size, unchanged. Thoracic aorta tortuous and atherosclerotic, unchanged. Hilar and mediastinal contours otherwise unremarkable. Prior left mastectomy and axillary node dissection. Mild degenerative changes involving the thoracic spine. Generalized osseous demineralization.  IMPRESSION: Acute pneumonia involving the right middle lobe and the left lower lobe, superimposed upon COPD/emphysema.   Electronically Signed   By: Hulan Saas M.D.   On: 05/08/2013 13:48    Medications:  Scheduled Meds: . enoxaparin (LOVENOX) injection  40 mg Subcutaneous Q24H  . ipratropium  0.5 mg Nebulization TID  . levalbuterol  0.63 mg Nebulization TID  . levofloxacin  750 mg Oral Daily  . nicotine  14 mg Transdermal Daily  . potassium chloride SA  20 mEq Oral BID  . vancomycin  1,000 mg Intravenous Q24H   Continuous Infusions:  PRN Meds:.acetaminophen, acetaminophen  Antibiotics: Anti-infectives   Start     Dose/Rate Route Frequency Ordered Stop   05/08/13 2000  vancomycin (VANCOCIN) IVPB 1000 mg/200 mL premix     1,000 mg 200 mL/hr over 60 Minutes Intravenous Every 24 hours 05/08/13 1912     05/08/13 1800  doxycycline  (VIBRA-TABS) tablet 100 mg  Status:  Discontinued     100 mg Oral Every 12 hours 05/08/13 1617 05/08/13 1812   05/08/13 1700  levofloxacin (LEVAQUIN) tablet 750 mg     750 mg Oral Daily 05/08/13 1647  Antibiotics Given (last 72 hours)   Date/Time Action Medication Dose Rate   05/08/13 1831 Given   levofloxacin (LEVAQUIN) tablet 750 mg 750 mg    05/08/13 2124 Given   vancomycin (VANCOCIN) IVPB 1000 mg/200 mL premix 1,000 mg 200 mL/hr   05/09/13 1032 Given   levofloxacin (LEVAQUIN) tablet 750 mg 750 mg    05/09/13 2335 Given  [Loss of IV access]   vancomycin (VANCOCIN) IVPB 1000 mg/200 mL premix 1,000 mg 200 mL/hr      Day of Hospitalization:  2 Consults:    Assessment/Plan:   Principal Problem:   HCAP (healthcare-associated pneumonia) Active Problems:   HYPERLIPIDEMIA   HEPATIC CIRRHOSIS, NONALCOHOLIC   Excessive drinking alcohol   COPD exacerbation  #HCAP  Pt presents with continued SOB after a recent COPD exacerbation. She denies any fever/chills, N/V/D/C, CP, or wheezing. CXR showed acute pneumonia involving the RML and LLL, superimposed upon COPD/emphysema.  She is feeling better today, afebrile--wishes to go home.  -BC NGTD; sputum cx pending -Legionella, strept pneumo to be collected  -levalbuterol 0.63 mg neb q4 hrs  -Ipratropium 0.5 mg neb q4 hrs  -O2   -levofloxacin 750mg  po qd  -vancomycin per pharmacy   #Chronic Hyponatremia  Upon viewing records, pt has experienced hyponatremia in the past. Appears euvolemic on exam. Stable blood pressure so not likely adrenal deficiency. No signs of hypothyroidism on exam. 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 HIV negative. TSH wnl.   #Hypokalemia Resolved--pt initially with low potassium. -Kdur bid  #Chronic Macrocytosis  Pt last MCV was 104.7. Most likely d/t alcohol abuse.   #Alcohol abuse  The patient reports drinking "2 tall cans of Budweiser" per day, which could range anywhere from normal 12 oz  cans (24 oz total) to 24 oz "tall boys" (48 oz total).  -CIWA   #Nicotine dependence  -Nicotine patch 14 mg  -Counseled patient about quitting tobacco   #FEN- Diet regular   #VTE prophylaxis- lovenox 40mg  SQ qd    #Dispo- Disposition is deferred at this time, awaiting improvement of current medical problems.  Anticipated discharge in approximately 1-2 day(s).    LOS: 2 days   Boykin Peek, MD 05/10/2013, 10:15 AM

## 2013-05-10 NOTE — Progress Notes (Signed)
   CARE MANAGEMENT NOTE 05/10/2013  Patient:  Kayla Tyler, Kayla Tyler   Account Number:  [id number]  Date Initiated:  05/10/2013  Documentation initiated by:  Darlyne Russian  Subjective/Objective Assessment:   admitted with COPD exacerbation  home O2 from [facility]     Action/Plan:   progression of care and discharge planning   Anticipated DC Date:  05/12/2013   Anticipated DC Plan:  HOME/SELF CARE         Choice offered to / List presented to:             Status of service:  Completed, signed off Medicare Important Message given?   (If response is "NO", the following Medicare IM given date fields will be blank) Date Medicare IM given:   Date Additional Medicare IM given:    Discharge Disposition:  HOME/SELF CARE  Per UR Regulation:    If discussed at Long Length of Stay Meetings, dates discussed:    Comments:  05/10/2013  [address] RN, [place]  [phone] CM referral: medication assistance  Patient has insurance for medication, MD made aware.

## 2013-05-10 NOTE — Discharge Summary (Signed)
Name: Kayla Tyler MRN: [id number] DOB: 08/04/1945 67 y.o. PCP: Christen Bame, MD . Date of Admission: 05/08/2013 12:13 PM Date of Discharge: 05/10/2013 Attending Physician: Farley Ly, MD  Discharge Diagnosis: Principal Problem:   HCAP (healthcare-associated pneumonia) Active Problems:   HYPERLIPIDEMIA   HEPATIC CIRRHOSIS, NONALCOHOLIC   Excessive drinking alcohol   COPD exacerbation  Discharge Medications:   Medication List    STOP taking these medications       doxycycline 100 MG tablet  Commonly known as:  VIBRA-TABS     predniSONE 20 MG tablet  Commonly known as:  DELTASONE      TAKE these medications       GOODY HEADACHE PO  Take 1 packet by mouth daily as needed (for body pain).     levalbuterol 45 MCG/ACT inhaler  Commonly known as:  XOPENEX HFA  Inhale 2 puffs into the lungs every 4 (four) hours as needed for wheezing.     levofloxacin 750 MG tablet  Commonly known as:  LEVAQUIN  Take 1 tablet (750 mg total) by mouth daily.     mometasone-formoterol 200-5 MCG/ACT Aero  Commonly known as:  DULERA  Inhale 2 puffs into the lungs 2 (two) times daily.     nicotine 14 mg/24hr patch  Commonly known as:  NICODERM CQ - dosed in mg/24 hours  Place 1 patch (14 mg total) onto the skin daily.     tiotropium 18 MCG inhalation capsule  Commonly known as:  SPIRIVA HANDIHALER  Place 1 capsule (18 mcg total) into inhaler and inhale daily.        Disposition and follow-up:   Kayla Tyler was discharged from [facility] in Stable condition.  Please address the following problems:  1.  Resolution of HCAP; pt will need a repeat CXR in 4-6 weeks; compliance with COPD medications  2.  Labs / imaging needed at time of follow-up: BMP  3.  Pending labs/ test needing follow-up: Legionella  Follow-up Appointments: Follow-up Information   Call [facility].   Contact information:   [address] [place]  [address] [phone]      Discharge Instructions: Discharge Orders   Future Orders Complete By Expires   Call MD for:  difficulty breathing, headache or visual disturbances  As directed    Call MD for:  persistant dizziness or light-headedness  As directed    Call MD for:  temperature >100.4  As directed    Diet - low sodium heart healthy  As directed    Increase activity slowly  As directed       Consultations:  None  Procedures Performed:  Dg Chest 2 View  05/08/2013   CLINICAL DATA:  COPD exacerbation with shortness of breath.  EXAM: CHEST  2 VIEW  COMPARISON:  Two-view chest x-ray and CTA chest 05/01/2013. Portable chest x-rays 04/30/2013, 02/02/2011.  FINDINGS: Interval development of patchy airspace opacities in the right middle lobe and left lower lobe since the examination 1 week ago. No airspace disease in the upper lobes. Hyperinflation and prominent bronchovascular markings diffusely with central peribronchial thickening, unchanged. No pleural effusions.  Cardiac silhouette normal in size, unchanged. Thoracic aorta tortuous and atherosclerotic, unchanged. Hilar and mediastinal contours otherwise unremarkable. Prior left mastectomy and axillary node dissection. Mild degenerative changes involving the thoracic spine. Generalized osseous demineralization.  IMPRESSION: Acute pneumonia involving the right middle lobe and the left lower lobe, superimposed upon COPD/emphysema.   Electronically  Signed   By: Hulan Saas M.D.   On: 05/08/2013 13:48   Dg Chest 2 View  05/01/2013   CLINICAL DATA:  Shortness of breath, asthma, bronchitis  EXAM: CHEST  2 VIEW  COMPARISON:  04/30/2013  FINDINGS: There is chronic bilateral interstitial thickening. The lungs are hyperinflated likely secondary to COPD. There is no focal parenchymal opacity, pleural effusion, or pneumothorax. The heart and mediastinal contours are unremarkable.  The osseous structures are unremarkable.  IMPRESSION: No active  cardiopulmonary disease.   Electronically Signed   By: Elige Ko   On: 05/01/2013 11:16   Ct Angio Chest Pe W/cm &/or Wo Cm  05/01/2013   CLINICAL DATA:  Shortness of breath. Nonproductive cough. Breast cancer. Elevated D-dimer.  EXAM: CT ANGIOGRAPHY CHEST WITH CONTRAST  TECHNIQUE: Multidetector CT imaging of the chest was performed using the standard protocol during bolus administration of intravenous contrast. Multiplanar CT image reconstructions including MIPs were obtained to evaluate the vascular anatomy.  CONTRAST:  80mL OMNIPAQUE IOHEXOL 350 MG/ML SOLN  COMPARISON:  Chest x-ray 05/01/2013.  FINDINGS: Ascending thoracic aorta measures 4.2 by 4.1 cm. No evidence of dissection. Aorta is atherosclerotic. Coronary artery disease present. Borderline cardiomegaly. Pulmonary arteries are normal. No pulmonary embolus. Shotty mediastinal lymph nodes. Sliding hiatal hernia. Adrenals normal.  Large airways are patent. Mild peribronchial cuffing suggesting possibility of bronchial/peribronchial inflammatory/ infectious change. Mild atelectasis left lung base. No significant pleural effusion or pneumothorax.  Thyroid appears normal. Shotty supraclavicular and axillary nodes are present. Surgical clips in the left axilla. Left mastectomy. Degenerative changes thoracic spine.  Review of the MIP images confirms the above findings.  IMPRESSION: 1. Bronchial and peribronchial thickening consistent with large airway disease. No pulmonary embolus. 2. Ascending thoracic aortic ectasia. 3. Coronary artery disease.   Electronically Signed   By: Maisie Fus  Register   On: 05/01/2013 12:13   Dg Chest Port 1 View  04/30/2013   CLINICAL DATA:  Shortness of breath  EXAM: PORTABLE CHEST - 1 VIEW  COMPARISON:  02/02/2011  FINDINGS: The cardiac shadow is stable. Tortuosity of the thoracic aorta is again noted. The lungs are again well aerated without focal infiltrate or sizable effusion. No bony abnormality is seen.  IMPRESSION: No  acute abnormality noted.   Electronically Signed   By: Alcide Clever M.D.   On: 04/30/2013 08:48    2D Echo: N/A  Cardiac Cath: N/A  Admission HPI: Pt is a 67 y.o. female with a PMH significant for PMH of breast cancer s/p left mastectomy and COPD (no PFTs on file) who presents from the [facility] with shortness of breath. She reports no improvement in her shortness of breath since she was discharged last week (05/02/13) for similar symptoms and was diagnosed with a COPD exacerbation. She was discharged on home oxygen which she has been using mainly while ambulating or as needed. She was also discharged on xopenex, dulera, prednisone, and spiriva. She reports compliance with her medications except she has not picked up her spiriva. She denies any recent sick contacts. She reports a chronic dry cough but denies fever, chills, hemoptysis, CP, or palpitations. She reports significant alcohol use, drinking two large cans of Budweiser (possibly "tall boys"--24 oz each) nightly. Unfortunately, she is still smoking daily (1 ppd). She was discharged with a nicotine patch but just picked it up today.  Of note, TTE on 05/01/13 shows EF of 65-70% with PA pressure mildly increased. During her last admission, with ambulation her O2 saturation drops to  85%. She was therefore sent home with 2L of home O2 to follow up in the [facility] today.   Hospital Course by problem list: Principal Problem:   HCAP (healthcare-associated pneumonia) Active Problems:   HYPERLIPIDEMIA   HEPATIC CIRRHOSIS, NONALCOHOLIC   Excessive drinking alcohol   COPD exacerbation   #HCAP  Pt presents with continued SOB after a recent COPD exacerbation. She denies any fever/chills, N/V/D/C, CP, or wheezing. CXR showed acute pneumonia involving the RML and LLL, superimposed upon COPD/emphysema. BC NGTD; sputum cx normal.  Legionella pending; strept pneumo positive.  We gave levalbuterol 0.63 mg neb q4 hrs and ipratropium 0.5 mg neb q4 hrs.  She was given O2  and vancomycin and levofloxacin for HCAP coverage.  05/10/13, pt was afebrile and wanted to go home.  She was discharged on levaquin with close follow-up with [facility].  She will need a repeat CXR in 4-6 weeks.   #Chronic Hyponatremia  Stable.  Upon viewing records, pt has experienced hyponatremia in the past. Appears euvolemic on exam. Stable blood pressure so not likely adrenal deficiency. No signs of hypothyroidism on exam. 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 HIV negative. TSH wnl.   #Hypokalemia  Resolved--pt initially with low potassium.  #Chronic Macrocytosis  Pt last MCV was 104.7. Most likely d/t alcohol abuse.   #Alcohol abuse  The patient reports drinking "2 tall cans of Budweiser" per day, which could range anywhere from normal 12 oz cans (24 oz total) to 24 oz "tall boys" (48 oz total).  She was placed on CIWA as a precaution.   #Nicotine dependence  Pt continues to smoke and was provided nicotine patch 14 mg.  She was counseled about quitting tobacco.    Discharge Vitals:   BP 118/76  Pulse 117  Temp(Src) 98.5 F (36.9 C) (Oral)  Resp 17  Ht 5\' 4"  (1.626 m)  Wt 49.533 kg (109 lb 3.2 oz)  BMI 18.73 kg/m2  SpO2 88%  Discharge Labs:  Results for orders placed during the hospital encounter of 05/08/13 (from the past 24 hour(s))  BASIC METABOLIC PANEL     Status: Abnormal   Collection Time    05/10/13  3:54 AM      Result Value Range   Sodium 132 (*) 135 - 145 mEq/L   Potassium 4.4  3.5 - 5.1 mEq/L   Chloride 99  96 - 112 mEq/L   CO2 24  19 - 32 mEq/L   Glucose, Bld 88  70 - 99 mg/dL   BUN 5 (*) 6 - 23 mg/dL   Creatinine, Ser 0.45  0.50 - 1.10 mg/dL   Calcium 8.4  8.4 - 40.9 mg/dL   GFR calc non Af Amer >90  >90 mL/min   GFR calc Af Amer >90  >90 mL/min  CBC     Status: Abnormal   Collection Time    05/10/13  3:54 AM      Result Value Range   WBC 10.2  4.0 - 10.5 K/uL   RBC 2.97 (*) 3.87 - 5.11 MIL/uL   Hemoglobin 10.7 (*) 12.0 - 15.0 g/dL   HCT 81.1 (*)  91.4 - 46.0 %   MCV 104.0 (*) 78.0 - 100.0 fL   MCH 36.0 (*) 26.0 - 34.0 pg   MCHC 34.6  30.0 - 36.0 g/dL   RDW 78.2  95.6 - 21.3 %   Platelets 356  150 - 400 K/uL  CULTURE, EXPECTORATED SPUTUM-ASSESSMENT     Status: None  Collection Time    05/10/13  8:38 AM      Result Value Range   Specimen Description SPUTUM     Special Requests Normal     Sputum evaluation       Value: THIS SPECIMEN IS ACCEPTABLE. RESPIRATORY CULTURE REPORT TO FOLLOW.   Report Status 05/10/2013 FINAL    STREP PNEUMONIAE URINARY ANTIGEN     Status: Abnormal   Collection Time    05/10/13 12:15 PM      Result Value Range   Strep Pneumo Urinary Antigen POSITIVE (*) NEGATIVE    Signed: Boykin Peek, MD 05/10/2013, 5:51 PM   Time Spent on Discharge: Services Ordered on Discharge: None Equipment Ordered on Discharge: None

## 2013-05-11 LAB — LEGIONELLA ANTIGEN, URINE

## 2013-05-12 LAB — CULTURE, RESPIRATORY W GRAM STAIN

## 2013-05-12 LAB — CULTURE, RESPIRATORY

## 2013-05-14 LAB — CULTURE, BLOOD (ROUTINE X 2): Culture: NO GROWTH

## 2013-05-24 ENCOUNTER — Ambulatory Visit: Payer: PRIVATE HEALTH INSURANCE | Admitting: Internal Medicine

## 2013-05-26 ENCOUNTER — Telehealth: Payer: Self-pay | Admitting: Licensed Clinical Social Worker

## 2013-05-26 NOTE — Telephone Encounter (Signed)
CSW received call from [facility] regarding initiating PCS request.  CSW placed call to Ms. Celia to inquire about needs in the home.  Pt states she does not need PCS because her daughter provides in home care.  CSW returned call to [facility] indicating pt is not in need of services at this time.

## 2013-06-01 ENCOUNTER — Telehealth: Payer: Self-pay | Admitting: Licensed Clinical Social Worker

## 2013-06-01 NOTE — Telephone Encounter (Signed)
Kayla Tyler left message with CSW requesting initiation of PCS request.  Pt concerned that she now may need assistance at home.  In addition, Kayla Tyler no showed a hospital f/u appt.   CSW placed called to pt.  CSW left message requesting return call. CSW provided contact hours and phone number.

## 2013-06-08 NOTE — Telephone Encounter (Signed)
CSW placed call to Ms. Voges to determine services needed.  Pt states she is talking with someone on the other line about personal care, pt did not want to continue call at this time.  CSW informed Ms. Sandstrom to notify CSW should any paperwork need to be completed on her behalf for PCS.  Pt voiced acknowledgement.  CSW will sign off at this time, pt aware CSW is available to assist.

## 2013-06-15 ENCOUNTER — Telehealth: Payer: Self-pay | Admitting: Licensed Clinical Social Worker

## 2013-06-15 NOTE — Telephone Encounter (Signed)
CSW returned call to Kayla Tyler.  Pt states she would like to be assessed for [facility] care.  Pt has had 2 hospitalizations since Dec. 2014.  CSW initiated PCS form and placed in PCP box.

## 2013-07-20 NOTE — Telephone Encounter (Signed)
CSW received call from [facility] requesting new PCS request.  CSW discussed request was already faxed to [facility] on 06/02/13.  CSW placed call to [facility] to determine need for new PCS request.  Janeece Riggers states pt's son declined assessment when they arrived to the home.  According to [facility], pt's son encouraged pt not to sign any paperwork until she discusses with daughter.  [facility] was unable to complete assessment.  CSW placed call to Ms. Tarry to determine if pt does indeed want PCS aide.  Pt stated she did.  CSW informed Ms. Alonge she will need to call and schedule appt with physician to discuss request as it has been over 90 days since her last visit.  CSW provided Ms. Vittorio with PCP name and phone number to front office.  Pt informed to call after 1:30 for scheduling.  CSW will proceed once pt has had a [facility] office visit.

## 2013-07-28 ENCOUNTER — Ambulatory Visit (INDEPENDENT_AMBULATORY_CARE_PROVIDER_SITE_OTHER): Payer: PRIVATE HEALTH INSURANCE | Admitting: Internal Medicine

## 2013-07-28 ENCOUNTER — Encounter: Payer: Self-pay | Admitting: Internal Medicine

## 2013-07-28 VITALS — BP 118/78 | HR 95 | Temp 97.2°F | Ht 64.9 in | Wt 99.3 lb

## 2013-07-28 DIAGNOSIS — J449 Chronic obstructive pulmonary disease, unspecified: Secondary | ICD-10-CM

## 2013-07-28 DIAGNOSIS — Z9981 Dependence on supplemental oxygen: Secondary | ICD-10-CM | POA: Insufficient documentation

## 2013-07-28 MED ORDER — MOMETASONE FURO-FORMOTEROL FUM 200-5 MCG/ACT IN AERO: 2.0000 | INHALATION_SPRAY | Freq: Two times a day (BID) | RESPIRATORY_TRACT | Status: DC

## 2013-07-28 MED ORDER — LEVALBUTEROL TARTRATE 45 MCG/ACT IN AERO: 2.0000 | INHALATION_SPRAY | RESPIRATORY_TRACT | Status: DC | PRN

## 2013-07-28 MED ORDER — TIOTROPIUM BROMIDE MONOHYDRATE 18 MCG IN CAPS: 18.0000 ug | ORAL_CAPSULE | Freq: Every day | RESPIRATORY_TRACT | Status: DC

## 2013-07-28 NOTE — Progress Notes (Signed)
   Subjective:    Patient ID: Kayla Tyler, female    DOB: [birth year], 68 y.o.   MRN: [id number]  HPI Kayla Tyler is a 68 yo woman pmh as listed below presents for home oxygen assessment.   Pt was ambulated with and without O2 and didn't go under 94%. Pt is doing well since her hospitalization 12/14 for her age And COPD exacerbation. She was then started on oxygen at that time and needed to undergo reassessment. She is accompanied by her niece or her caregiver is very concerned since the patient continues to smoke at home and with ideally like oxygen removed from the home.  The patient reports doing remarkably well having no fevers or chills, increased cough, increased sputum production, she is tolerating a full regular diet with no nausea vomiting or diarrhea. She completed all of her antibiotics and prednisone taper as prescribed and felt completely recovered.  Review of Systems  Constitutional: Negative for fever, chills, diaphoresis, appetite change, fatigue and unexpected weight change.  HENT: Negative for congestion, postnasal drip, rhinorrhea, sinus pressure and sore throat.   Respiratory: Negative for cough, choking, chest tightness and shortness of breath.   Cardiovascular: Negative for chest pain, palpitations and leg swelling.  Gastrointestinal: Negative for nausea, vomiting, abdominal pain, diarrhea and constipation.    Past Medical History  Diagnosis Date  . Breast cancer    Social, surgical, family history reviewed with patient and updated in appropriate chart locations.     Objective:   Physical Exam Filed Vitals:   07/28/13 1447  BP:   Pulse: 95  Temp:    General: sitting in chair, NAD, very cachectic chronically ill appearing HEENT: no scleral icterus Cardiac: RRR, no rubs, murmurs or gallops Pulm: clear to auscultation bilaterally, moving normal volumes of air Abd: soft, nontender, nondistended, BS present Ext: warm and well perfused, no pedal edema Neuro: alert  and oriented X3, cranial nerves II-XII grossly intact   Assessment & Plan:  Please see problem oriented charting  Pt discussed with Dr. Ellwood Dense

## 2013-07-28 NOTE — Assessment & Plan Note (Signed)
Patient being reassessed for home oxygen needs. There is concern about safety given her continued smoking of at least one to 2 packs per day by her caregiver.  -[facility] was called to cancel oxygen order as patient ambulated off of oxygen with a saturation as high as 94%.

## 2013-07-28 NOTE — Assessment & Plan Note (Signed)
Patient has no symptoms to indicate a repeat COPD exacerbation or superinfection given the resolution of her age Infection in 04/2013. -pt needed refill of her inhalers

## 2013-08-04 NOTE — Progress Notes (Signed)
Case discussed with Dr. Sadek at the time of the visit.  We reviewed the resident's history and exam and pertinent patient test results.  I agree with the assessment, diagnosis, and plan of care documented in the resident's note. 

## 2013-08-28 ENCOUNTER — Telehealth: Payer: Self-pay | Admitting: Internal Medicine

## 2013-08-28 NOTE — Telephone Encounter (Signed)
   Reason for call:   I received a call from [name] at 5:47 PM indicating that she needs to "get off this machine".  Upon further questioning and clarification, it seems she is talking about her supplemental oxygen tank. She tried calling the "company" which I am assuming is [facility] to pick up the tank but she was told by them that the Doctor has to tell them to pick it up.   She reports no complaints at this time, no respiratory distress.  She has not been using her oxygen at home but does take the small tank with her when she goes outside just in case.    Pertinent Data:   Last office visit with PCP, Dr. Algis Liming 07/28/13 where oxygen needs were reassessed and per Dr. Algis Liming she no longer required supplemental oxygen and [facility] was called to cancel the oxygen order.    O sat off oxygen with ambulation noted to as high as 94% on that visit.   She want's someone to pick up the tank if she does not need it, because she still has it and is paying for it   Assessment / Plan / Recommendations:   Kayla Tyler is a 68 year old female with COPD and smoker who called to ask if she can be taken off her machine.   Per PCP office visit note, she no longer requires oxygen supplementation and [facility] was apparently called to cancel the order but it appears that was never done as patient still has the oxygen tank.  I will forward this message to PCP who will likely have to touch base with [facility] again to try to cancel the oxygen again if appropriate and notify the patient.   As always, pt is advised that if symptoms worsen or new symptoms arise, they should go to an urgent care facility or to to ER for further evaluation.   Jerene Pitch, MD   08/28/2013, 5:56 PM

## 2013-09-06 ENCOUNTER — Telehealth: Payer: Self-pay | Admitting: *Deleted

## 2013-09-06 NOTE — Telephone Encounter (Signed)
Pt's daughter came to office asking for a letter to be written for her Mother to give to her  [place] before her son's court date.  Son will be incarcerated and when son is sentenced Mom wants him close to home.  The letter just states  "whom it may concern"  -  pt can not travel long distances because  of her health.   Daughter will come and get the letter.  Her # [phone]  Also the O2 tanks are still in pt's home.  [facility] never picked them up and pt is using the oxygen and smoking.   Can you give your nurse an order to d/c home O2 and have the tanks removed.

## 2013-09-07 NOTE — Telephone Encounter (Signed)
We have text paged Dr. Algis Liming advising her to check her EPIC messages.  Review of the schedule reveals that she is on Rheumatology elective, so she should be available to complete the letter if appropriate.

## 2013-09-07 NOTE — Telephone Encounter (Signed)
Unable to get in touch with Dr Algis Liming.  Not sure who to send this to.  Please advise

## 2013-09-08 ENCOUNTER — Encounter: Payer: Self-pay | Admitting: *Deleted

## 2013-09-08 NOTE — Telephone Encounter (Addendum)
Called pt's daughter Kayla Tyler) and informed her we would call [facility] again to remove the Oxygen from home. Kayla Tyler will call [place] today.  Also I told her Dr Algis Liming did not feel comfortable witting the letter that was requested.  Daughter very unhappy stating Dr Algis Liming had seen pt in hospital and clinic.  Pt uses several inhalers and needs a walker to get around.  It would be hard for her to travel long distances to see her son in prison. Please have her write a letter my May 18th., court date, so the lawyer will have it on hand. The lawyer stated they were talking about sending her son to [place] or [place].  The letter would help keeping him closer to home, maybe [place].

## 2013-09-08 NOTE — Telephone Encounter (Signed)
Upon review of the patient chart as I have only encountered the patient one time for a hospital follow up for assessment for need of continued oxygen I do not feel that this letter is appropriate. I do not feel comfortable writing a letter to a lawyer for the incarceration decision of the patient son. It appears that the patient is not dependent on her son and is not living with her son. The patient did not make this request and there are significant family/social dynamics that are not clear therefore a physician decision/letter of support cannot be comfortably written at this time.  It is unclear what is happening with the patient's oxygen as there have been faxes and personal calls to [facility] to remove the oxygen tank and no prescriptions to be renewed. All of these encounters have been recorded.

## 2013-09-13 ENCOUNTER — Emergency Department (HOSPITAL_COMMUNITY)
Admission: EM | Admit: 2013-09-13 | Discharge: 2013-09-13 | Disposition: A | Discharge status: Home / Self Care | Payer: PRIVATE HEALTH INSURANCE | Attending: Emergency Medicine | Admitting: Emergency Medicine

## 2013-09-13 ENCOUNTER — Encounter (HOSPITAL_COMMUNITY): Payer: Self-pay | Admitting: Emergency Medicine

## 2013-09-13 ENCOUNTER — Emergency Department (HOSPITAL_COMMUNITY): Payer: PRIVATE HEALTH INSURANCE

## 2013-09-13 DIAGNOSIS — S161XXA Strain of muscle, fascia and tendon at neck level, initial encounter: Secondary | ICD-10-CM

## 2013-09-13 DIAGNOSIS — Z853 Personal history of malignant neoplasm of breast: Secondary | ICD-10-CM | POA: Insufficient documentation

## 2013-09-13 DIAGNOSIS — S9032XA Contusion of left foot, initial encounter: Secondary | ICD-10-CM

## 2013-09-13 DIAGNOSIS — Y939 Activity, unspecified: Secondary | ICD-10-CM | POA: Insufficient documentation

## 2013-09-13 DIAGNOSIS — Z880 Allergy status to penicillin: Secondary | ICD-10-CM | POA: Insufficient documentation

## 2013-09-13 DIAGNOSIS — Z79899 Other long term (current) drug therapy: Secondary | ICD-10-CM | POA: Insufficient documentation

## 2013-09-13 DIAGNOSIS — W19XXXA Unspecified fall, initial encounter: Secondary | ICD-10-CM

## 2013-09-13 DIAGNOSIS — W010XXA Fall on same level from slipping, tripping and stumbling without subsequent striking against object, initial encounter: Secondary | ICD-10-CM | POA: Insufficient documentation

## 2013-09-13 DIAGNOSIS — F17200 Nicotine dependence, unspecified, uncomplicated: Secondary | ICD-10-CM | POA: Insufficient documentation

## 2013-09-13 DIAGNOSIS — S9030XA Contusion of unspecified foot, initial encounter: Secondary | ICD-10-CM | POA: Insufficient documentation

## 2013-09-13 DIAGNOSIS — Y929 Unspecified place or not applicable: Secondary | ICD-10-CM | POA: Insufficient documentation

## 2013-09-13 DIAGNOSIS — IMO0002 Reserved for concepts with insufficient information to code with codable children: Secondary | ICD-10-CM | POA: Insufficient documentation

## 2013-09-13 DIAGNOSIS — S139XXA Sprain of joints and ligaments of unspecified parts of neck, initial encounter: Secondary | ICD-10-CM | POA: Insufficient documentation

## 2013-09-13 NOTE — Discharge Instructions (Signed)
Cervical Sprain A cervical sprain is an injury in the neck in which the strong, fibrous tissues (ligaments) that connect your neck bones stretch or tear. Cervical sprains can range from mild to severe. Severe cervical sprains can cause the neck vertebrae to be unstable. This can lead to damage of the spinal cord and can result in serious nervous system problems. The amount of time it takes for a cervical sprain to get better depends on the cause and extent of the injury. Most cervical sprains heal in 1 to 3 weeks. CAUSES  Severe cervical sprains may be caused by:   Contact sport injuries (such as from football, rugby, wrestling, hockey, auto racing, gymnastics, diving, martial arts, or boxing).   Motor vehicle collisions.   Whiplash injuries. This is an injury from a sudden forward-and backward whipping movement of the head and neck.  Falls.  Mild cervical sprains may be caused by:   Being in an awkward position, such as while cradling a telephone between your ear and shoulder.   Sitting in a chair that does not offer proper support.   Working at a poorly Landscape architect station.   Looking up or down for long periods of time.  SYMPTOMS   Pain, soreness, stiffness, or a burning sensation in the front, back, or sides of the neck. This discomfort may develop immediately after the injury or slowly, 24 hours or more after the injury.   Pain or tenderness directly in the middle of the back of the neck.   Shoulder or upper back pain.   Limited ability to move the neck.   Headache.   Dizziness.   Weakness, numbness, or tingling in the hands or arms.   Muscle spasms.   Difficulty swallowing or chewing.   Tenderness and swelling of the neck.  DIAGNOSIS  Most of the time your health care provider can diagnose a cervical sprain by taking your history and doing a physical exam. Your health care provider will ask about previous neck injuries and any known neck  problems, such as arthritis in the neck. X-rays may be taken to find out if there are any other problems, such as with the bones of the neck. Other tests, such as a CT scan or MRI, may also be needed.  TREATMENT  Treatment depends on the severity of the cervical sprain. Mild sprains can be treated with rest, keeping the neck in place (immobilization), and pain medicines. Severe cervical sprains are immediately immobilized. Further treatment is done to help with pain, muscle spasms, and other symptoms and may include:  Medicines, such as pain relievers, numbing medicines, or muscle relaxants.   Physical therapy. This may involve stretching exercises, strengthening exercises, and posture training. Exercises and improved posture can help stabilize the neck, strengthen muscles, and help stop symptoms from returning.  HOME CARE INSTRUCTIONS   Put ice on the injured area.   Put ice in a plastic bag.   Place a towel between your skin and the bag.   Leave the ice on for 15 20 minutes, 3 4 times a day.   If your injury was severe, you may have been given a cervical collar to wear. A cervical collar is a two-piece collar designed to keep your neck from moving while it heals.  Do not remove the collar unless instructed by your health care provider.  If you have long hair, keep it outside of the collar.  Ask your health care provider before making any adjustments to your collar.  Minor adjustments may be required over time to improve comfort and reduce pressure on your chin or on the back of your head.  Ifyou are allowed to remove the collar for cleaning or bathing, follow your health care provider's instructions on how to do so safely.  Keep your collar clean by wiping it with mild soap and water and drying it completely. If the collar you have been given includes removable pads, remove them every 1 2 days and hand wash them with soap and water. Allow them to air dry. They should be completely  dry before you wear them in the collar.  If you are allowed to remove the collar for cleaning and bathing, wash and dry the skin of your neck. Check your skin for irritation or sores. If you see any, tell your health care provider.  Do not drive while wearing the collar.   Only take over-the-counter or prescription medicines for pain, discomfort, or fever as directed by your health care provider.   Keep all follow-up appointments as directed by your health care provider.   Keep all physical therapy appointments as directed by your health care provider.   Make any needed adjustments to your workstation to promote good posture.   Avoid positions and activities that make your symptoms worse.   Warm up and stretch before being active to help prevent problems.  SEEK MEDICAL CARE IF:   Your pain is not controlled with medicine.   You are unable to decrease your pain medicine over time as planned.   Your activity level is not improving as expected.  SEEK IMMEDIATE MEDICAL CARE IF:   You develop any bleeding.  You develop stomach upset.  You have signs of an allergic reaction to your medicine.   Your symptoms get worse.   You develop new, unexplained symptoms.   You have numbness, tingling, weakness, or paralysis in any part of your body.  MAKE SURE YOU:   Understand these instructions.  Will watch your condition.  Will get help right away if you are not doing well or get worse. Document Released: 03/01/2007 Document Revised: 02/22/2013 Document Reviewed: 11/09/2012 [facility] Patient Information 2014 [place].  Foot Contusion A foot contusion is a deep bruise to the foot. Contusions are the result of an injury that caused bleeding under the skin. The contusion may turn blue, purple, or yellow. Minor injuries will give you a painless contusion, but more severe contusions may stay painful and swollen for a few weeks. CAUSES  A foot contusion comes from a  direct blow to that area, such as a heavy object falling on the foot. SYMPTOMS   Swelling of the foot.  Discoloration of the foot.  Tenderness or soreness of the foot. DIAGNOSIS  You will have a physical exam and will be asked about your history. You may need an X-ray of your foot to look for a broken bone (fracture).  TREATMENT  An elastic wrap may be recommended to support your foot. Resting, elevating, and applying cold compresses to your foot are often the best treatments for a foot contusion. Over-the-counter medicines may also be recommended for pain control. HOME CARE INSTRUCTIONS   Put ice on the injured area.  Put ice in a plastic bag.  Place a towel between your skin and the bag.  Leave the ice on for 15-20 minutes, 03-04 times a day.  Only take over-the-counter or prescription medicines for pain, discomfort, or fever as directed by your caregiver.  If told,  use an elastic wrap as directed. This can help reduce swelling. You may remove the wrap for sleeping, showering, and bathing. If your toes become numb, cold, or blue, take the wrap off and reapply it more loosely.  Elevate your foot with pillows to reduce swelling.  Try to avoid standing or walking while the foot is painful. Do not resume use until instructed by your caregiver. Then, begin use gradually. If pain develops, decrease use. Gradually increase activities that do not cause discomfort until you have normal use of your foot.  See your caregiver as directed. It is very important to keep all follow-up appointments in order to avoid any lasting problems with your foot, including long-term (chronic) pain. SEEK IMMEDIATE MEDICAL CARE IF:   You have increased redness, swelling, or pain in your foot.  Your swelling or pain is not relieved with medicines.  You have loss of feeling in your foot or are unable to move your toes.  Your foot turns cold or blue.  You have pain when you move your toes.  Your foot  becomes warm to the touch.  Your contusion does not improve in 2 days. MAKE SURE YOU:   Understand these instructions.  Will watch your condition.  Will get help right away if you are not doing well or get worse. Document Released: 02/23/2006 Document Revised: 11/03/2011 Document Reviewed: 04/07/2011 [facility] Patient Information 2014 [place], [place].

## 2013-09-13 NOTE — ED Provider Notes (Signed)
CSN: [id number]     Arrival date & time 09/13/13  1042 History   First MD Initiated Contact with Patient 09/13/13 1142     Chief Complaint  Patient presents with  . Fall     (Consider location/radiation/quality/duration/timing/severity/associated sxs/prior Treatment) Patient is a 68 y.o. female presenting with fall.  Fall   Pt reports she tripped over her dog 4 days ago injuring her L foot and her neck. She reports persistent moderate aching pain to L foot, worse with movement and mild aching pain to diffuse neck, worse with movement.   Past Medical History  Diagnosis Date  . Breast cancer    Past Surgical History  Procedure Laterality Date  . Mastectomy Left    History reviewed. No pertinent family history. History  Substance Use Topics  . Smoking status: Current Every Day Smoker -- 1.00 packs/day for 48 years  . Smokeless tobacco: Never Used  . Alcohol Use: 7.2 oz/week    12 Cans of beer per week   OB History   Grav Para Term Preterm Abortions TAB SAB Ect Mult Living                 Review of Systems All other systems reviewed and are negative except as noted in HPI.     Allergies  Penicillins  Home Medications   Prior to Admission medications   Medication Sig Start Date End Date Taking? Authorizing Provider  Aspirin-Acetaminophen-Caffeine (GOODY HEADACHE PO) Take 1 packet by mouth daily as needed (for body pain).   Yes Historical Provider, MD  levalbuterol [facility] HFA) 45 MCG/ACT inhaler Inhale 2 puffs into the lungs every 4 (four) hours as needed for wheezing. 07/28/13  Yes Clinton Gallant, MD  mometasone-formoterol (DULERA) 200-5 MCG/ACT AERO Inhale 2 puffs into the lungs 2 (two) times daily. 07/28/13  Yes Clinton Gallant, MD  tiotropium (SPIRIVA HANDIHALER) 18 MCG inhalation capsule Place 1 capsule (18 mcg total) into inhaler and inhale daily. 07/28/13  Yes Clinton Gallant, MD   BP 130/80  Pulse 106  Temp(Src) 98.1 F (36.7 C) (Oral)  Resp 23  SpO2 97% Physical Exam   Nursing note and vitals reviewed. Constitutional: She is oriented to person, place, and time. She appears well-developed and well-nourished.  HENT:  Head: Normocephalic and atraumatic.  Eyes: EOM are normal. Pupils are equal, round, and reactive to light.  Neck: Normal range of motion. Neck supple.  Diffuse cervical tenderness to muscles and spinous processes  Cardiovascular: Normal rate, normal heart sounds and intact distal pulses.   Pulmonary/Chest: Effort normal and breath sounds normal.  Abdominal: Bowel sounds are normal. She exhibits no distension. There is no tenderness.  Musculoskeletal: Normal range of motion. She exhibits tenderness (dorsal foot tender, erythematous, NVI). She exhibits no edema.  Neurological: She is alert and oriented to person, place, and time. She has normal strength. No cranial nerve deficit or sensory deficit.  Skin: Skin is warm and dry. No rash noted.  Psychiatric: She has a normal mood and affect.    ED Course  Procedures (including critical care time) Labs Review Labs Reviewed - No data to display  Imaging Review Dg Cervical Spine Complete  09/13/2013   CLINICAL DATA:  Recent fall with neck trauma.  Posterior neck pain.  EXAM: CERVICAL SPINE  4+ VIEWS  COMPARISON:  None.  FINDINGS: The cervical spine is visualized from the occiput to C6-7. The cervicothoracic junction is obscured by the patient's shoulders. Alignment is anatomic. Vertebral body and disc space height  are maintained. Prevertebral soft tissues are within normal limits. Very mild endplate degenerative changes. Neural foramina appear grossly patent. Dens is obscured on the dedicated views. Visualized lung apices appear grossly clear.  IMPRESSION: 1. Cervicothoracic junction is obscured by the patient's shoulders. Otherwise, no fracture or subluxation. 2. Mild spondylosis.   Electronically Signed   By: Lorin Picket M.D.   On: 09/13/2013 13:27   Dg Foot Complete Left  09/13/2013   CLINICAL  DATA:  Anterior foot pain after recent fall. Prior surgery to second toe.  EXAM: LEFT FOOT - COMPLETE 3+ VIEW  COMPARISON:  None.  FINDINGS: The bones are diffusely osteopenic. There is mild cortical discontinuity and deformity involving the distal shaft of the second toe proximal phalanx. There is no dislocation. There is a small plantar calcaneal enthesophyte. No focal soft tissue abnormality is seen.  IMPRESSION: Age indeterminate fracture of the second toe proximal phalanx. Osteopenia.   Electronically Signed   By: Logan Bores   On: 09/13/2013 13:28     EKG Interpretation None      MDM   Final diagnoses:  Fall  Contusion of foot, left  Cervical strain    No tenderness on 2nd toe, pt states old injury. No tenderness over the C7 spinous process, doubt occult injury there. ACE wrap for foot, topical and OTC pain remedies for pain. PCP followup.     Ladene Allocca B. Karle Starch, MD 09/13/13 1356

## 2013-09-13 NOTE — ED Notes (Signed)
She tripped and fell on Sunday. She states shes had increasing pain in her neck and L foot since. She denies LOC. She is A&Ox4, breathing easily

## 2013-10-05 NOTE — Telephone Encounter (Signed)
Upon review again it is not thought to be appropriate and a letter will not be made by this provider given previously stated concerns.

## 2014-06-22 ENCOUNTER — Emergency Department (HOSPITAL_COMMUNITY)
Admission: EM | Admit: 2014-06-22 | Discharge: 2014-06-22 | Disposition: A | Discharge status: Home / Self Care | Payer: Medicare Other

## 2014-06-22 ENCOUNTER — Encounter: Payer: Self-pay | Admitting: Internal Medicine

## 2014-06-22 ENCOUNTER — Ambulatory Visit (INDEPENDENT_AMBULATORY_CARE_PROVIDER_SITE_OTHER): Payer: Medicare Other | Admitting: Internal Medicine

## 2014-06-22 VITALS — BP 172/114 | HR 106 | Temp 98.0°F | Ht 65.0 in | Wt 93.4 lb

## 2014-06-22 DIAGNOSIS — I10 Essential (primary) hypertension: Secondary | ICD-10-CM

## 2014-06-22 DIAGNOSIS — J410 Simple chronic bronchitis: Secondary | ICD-10-CM

## 2014-06-22 MED ORDER — LEVALBUTEROL TARTRATE 45 MCG/ACT IN AERO: 2.0000 | INHALATION_SPRAY | RESPIRATORY_TRACT | Status: DC | PRN

## 2014-06-22 MED ORDER — MOMETASONE FURO-FORMOTEROL FUM 200-5 MCG/ACT IN AERO: 2.0000 | INHALATION_SPRAY | Freq: Two times a day (BID) | RESPIRATORY_TRACT | Status: DC

## 2014-06-22 MED ORDER — HYDROCHLOROTHIAZIDE 25 MG PO TABS: 25.0000 mg | ORAL_TABLET | Freq: Every day | ORAL | Status: DC

## 2014-06-22 MED ORDER — DM-GUAIFENESIN ER 30-600 MG PO TB12: 1.0000 | ORAL_TABLET | Freq: Two times a day (BID) | ORAL | Status: DC

## 2014-06-22 MED ORDER — TIOTROPIUM BROMIDE MONOHYDRATE 18 MCG IN CAPS: 18.0000 ug | ORAL_CAPSULE | Freq: Every day | RESPIRATORY_TRACT | Status: DC

## 2014-06-22 NOTE — Assessment & Plan Note (Signed)
BP Readings from Last 3 Encounters:  06/22/14 172/114  09/13/13 145/82  07/28/13 118/78    Lab Results  Component Value Date   NA 132* 05/10/2013   K 4.4 05/10/2013   CREATININE 0.58 05/10/2013    Assessment: Blood pressure control:   Progress toward BP goal:    Comments: elevation   Plan: Medications:  Will start HCTZ 25mg  qd  Educational resources provided:   Self management tools provided:   Other plans: f/u in 2 wks for Bmet and BP recheck

## 2014-06-22 NOTE — Progress Notes (Signed)
   Subjective:   Patient ID: IDALY Tyler female   DOB: 10/09/1945 69 y.o.   MRN: [id number]  HPI: [name] is a 69 y.o. woman pmh as listed below who presents for a dry cough.   the patient states that she's been out of all of her inhalers and other medications for about 2 weeks. In that time she developed a dry cough that is not associated with any fever, mucus, shortness of breath, or dyspnea on exertion. She states that she has open the window and that seems to help relieve the cough. She's had no known sick contacts. The patient continues to smoke during this time about a half pack per day.   Past Medical History  Diagnosis Date  . Breast cancer    Current Outpatient Prescriptions  Medication Sig Dispense Refill  . dextromethorphan-guaiFENesin (MUCINEX DM) 30-600 MG per 12 hr tablet Take 1 tablet by mouth 2 (two) times daily. 30 tablet 2  . hydrochlorothiazide (HYDRODIURIL) 25 MG tablet Take 1 tablet (25 mg total) by mouth daily. 30 tablet 1  . levalbuterol (XOPENEX HFA) 45 MCG/ACT inhaler Inhale 2 puffs into the lungs every 4 (four) hours as needed for wheezing. 1 Inhaler 12  . mometasone-formoterol (DULERA) 200-5 MCG/ACT AERO Inhale 2 puffs into the lungs 2 (two) times daily. 1 Inhaler 11  . tiotropium (SPIRIVA HANDIHALER) 18 MCG inhalation capsule Place 1 capsule (18 mcg total) into inhaler and inhale daily. 30 capsule 12   No current facility-administered medications for this visit.   No family history on file. History   Social History  . Marital Status: Widowed    Spouse Name: N/A    Number of Children: N/A  . Years of Education: N/A   Social History Main Topics  . Smoking status: Current Every Day Smoker -- 1.00 packs/day for 48 years  . Smokeless tobacco: Never Used     Comment: cutting back 3/4 pack  . Alcohol Use: 7.2 oz/week    12 Cans of beer per week  . Drug Use: No  . Sexual Activity: None   Other Topics Concern  . None   Social History Narrative    Review of Systems: Pertinent items are noted in HPI. Objective:  Physical Exam: Filed Vitals:   06/22/14 1529  BP: 172/114  Pulse: 106  Temp: 98 F (36.7 C)  TempSrc: Oral  Height: 5\' 5"  (1.651 m)  Weight: 93 lb 6.4 oz (42.366 kg)  SpO2: 99%   General sitting in chair, cachetic  HEENT: PERRL, EOMI, no scleral icterus Cardiac: RRR, no rubs, murmurs or gallops Pulm: clear to auscultation bilaterally,some upper airway noise in upper lung fields, no crackles/wheezes, moving normal volumes of air Abd: soft, nontender, nondistended, BS present Ext: warm and well perfused, no pedal edema Neuro: alert and oriented X3, cranial nerves II-XII grossly intact  Assessment & Plan:  Please see problem oriented charting  Pt discussed with Dr. Riki Sheer

## 2014-06-22 NOTE — ED Notes (Signed)
noi answer person in waiting mrteports mthat her daughter Lynder Parents

## 2014-06-22 NOTE — Patient Instructions (Signed)
General Instructions:   Thank you for bringing your medicines today. This helps [place] keep you safe from mistakes.  FOr your blood pressure: we will start a new medicine and see you in 2 wks for some lab work and see how you are doing.  Progress Toward Treatment Goals:  Treatment Goal 06/22/2014  Stop smoking stopped smoking    Self Care Goals & Plans:  Self Care Goal 06/22/2014  Manage my medications take my medicines as prescribed; bring my medications to every visit; refill my medications on time  Eat healthy foods drink diet soda or water instead of juice or soda; eat more vegetables; eat foods that are low in salt; eat baked foods instead of fried foods; eat fruit for snacks and desserts; eat smaller portions  Stop smoking -    No flowsheet data found.   Care Management & Community Referrals:  No flowsheet data found.

## 2014-06-22 NOTE — Assessment & Plan Note (Signed)
The patient has been without inhalers for about 2 weeks which coincides with this dry cough. Patient has no signs or symptoms to suggest a COPD exacerbation.  Symptomatically management with Mucinex Refill of all inhalers

## 2014-06-22 NOTE — ED Notes (Signed)
Pt called twice for triage with no response

## 2014-06-22 NOTE — Progress Notes (Signed)
Medicine attending: Medical history, presenting problems, physical findings, and medications, reviewed with Dr Nora Sadek and I concur with her evaluation and management plan. 

## 2014-08-03 ENCOUNTER — Encounter: Payer: Self-pay | Admitting: Internal Medicine

## 2014-08-03 ENCOUNTER — Ambulatory Visit (INDEPENDENT_AMBULATORY_CARE_PROVIDER_SITE_OTHER): Payer: Medicare Other | Admitting: Internal Medicine

## 2014-08-03 VITALS — BP 157/101 | HR 113 | Temp 98.2°F | Ht 64.0 in | Wt 90.6 lb

## 2014-08-03 DIAGNOSIS — J449 Chronic obstructive pulmonary disease, unspecified: Secondary | ICD-10-CM

## 2014-08-03 DIAGNOSIS — I10 Essential (primary) hypertension: Secondary | ICD-10-CM | POA: Diagnosis not present

## 2014-08-03 DIAGNOSIS — F17210 Nicotine dependence, cigarettes, uncomplicated: Secondary | ICD-10-CM | POA: Diagnosis not present

## 2014-08-03 DIAGNOSIS — J410 Simple chronic bronchitis: Secondary | ICD-10-CM

## 2014-08-03 DIAGNOSIS — Z9981 Dependence on supplemental oxygen: Secondary | ICD-10-CM | POA: Diagnosis not present

## 2014-08-03 MED ORDER — MOMETASONE FURO-FORMOTEROL FUM 200-5 MCG/ACT IN AERO: 2.0000 | INHALATION_SPRAY | Freq: Two times a day (BID) | RESPIRATORY_TRACT | Status: DC

## 2014-08-03 MED ORDER — LISINOPRIL-HYDROCHLOROTHIAZIDE 20-25 MG PO TABS: 1.0000 | ORAL_TABLET | Freq: Every day | ORAL | Status: DC

## 2014-08-03 MED ORDER — TIOTROPIUM BROMIDE MONOHYDRATE 18 MCG IN CAPS: 18.0000 ug | ORAL_CAPSULE | Freq: Every day | RESPIRATORY_TRACT | Status: DC

## 2014-08-03 MED ORDER — LEVALBUTEROL TARTRATE 45 MCG/ACT IN AERO: 2.0000 | INHALATION_SPRAY | RESPIRATORY_TRACT | Status: DC | PRN

## 2014-08-03 NOTE — Assessment & Plan Note (Signed)
BP Readings from Last 3 Encounters:  08/03/14 157/101  06/22/14 172/114  09/13/13 145/82    Lab Results  Component Value Date   NA 132* 05/10/2013   K 4.4 05/10/2013   CREATININE 0.58 05/10/2013    Assessment: Blood pressure control:   Progress toward BP goal:    Comments: pt continues to have elevated readings  Plan: Medications:  Change to HCTZ-lisinopril 25-20 mg daily Educational resources provided:  (denies) Self management tools provided:   Other plans: Follow-up in one month for titration and repeat be met at that time

## 2014-08-03 NOTE — Patient Instructions (Signed)
General Instructions: For your blood pressure you can stop taking the HCTZ 25mg  pill and take the combination pill with HCTZ-lisinopril once a day. We will follow up in 1 month to see how your doing.   For your COPD: take the [facility], Spiriva and then use the Xopenex if still feel short of breath.   Keep up the good work with your cutting back on your smoking.   Thank you for bringing your medicines today. This helps [place] keep you safe from mistakes.   Progress Toward Treatment Goals:  Treatment Goal 06/22/2014  Stop smoking stopped smoking    Self Care Goals & Plans:  Self Care Goal 08/03/2014  Manage my medications take my medicines as prescribed; bring my medications to every visit; refill my medications on time  Eat healthy foods drink diet soda or water instead of juice or soda; eat more vegetables; eat foods that are low in salt; eat baked foods instead of fried foods  Stop smoking -    No flowsheet data found.   Care Management & Community Referrals:  No flowsheet data found.

## 2014-08-03 NOTE — Assessment & Plan Note (Signed)
Refills provided for Dulera, Spiriva and Xopenex. The patient does not appear to be in acute exacerbation. 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 Wt Readings from Last 3 Encounters:  08/03/14 90 lb 9.6 oz (41.096 kg)  06/22/14 93 lb 6.4 oz (42.366 kg)  07/28/13 99 lb 4.8 oz (45.042 kg)   -Recommend end-of-life discussion at next follow-up -Continue to encourage smoking cessation -Nutritional supplementation was also discussed

## 2014-08-03 NOTE — Progress Notes (Signed)
   Subjective:   Patient ID: IDABELL PICKING female   DOB: Feb 08, 1946 69 y.o.   MRN: [id number]  HPI: Ms.Zuleika Jerilynn Mages Stlouis is a 69 y.o. woman with a past medical history as listed below who presents for hypertension follow-up and medication refills.  Hypertension ROS: taking medications as instructed, no medication side effects noted, no TIA's, no chest pain on exertion, no dyspnea on exertion and no swelling of ankles.   New concerns: She has ran out of one of her inhalers and needs a refill. The patient has also been cutting back on her smoking and is down to about a pack per day    Past Medical History  Diagnosis Date  . Breast cancer    Current Outpatient Prescriptions  Medication Sig Dispense Refill  . levalbuterol (XOPENEX HFA) 45 MCG/ACT inhaler Inhale 2 puffs into the lungs every 4 (four) hours as needed for wheezing. 1 Inhaler 12  . lisinopril-hydrochlorothiazide (PRINZIDE,ZESTORETIC) 20-25 MG per tablet Take 1 tablet by mouth daily. 30 tablet 3  . mometasone-formoterol (DULERA) 200-5 MCG/ACT AERO Inhale 2 puffs into the lungs 2 (two) times daily. 1 Inhaler 11  . tiotropium (SPIRIVA HANDIHALER) 18 MCG inhalation capsule Place 1 capsule (18 mcg total) into inhaler and inhale daily. 30 capsule 12   No current facility-administered medications for this visit.   No family history on file. History   Social History  . Marital Status: Widowed    Spouse Name: N/A  . Number of Children: N/A  . Years of Education: N/A   Social History Main Topics  . Smoking status: Current Every Day Smoker -- 1.00 packs/day for 48 years  . Smokeless tobacco: Never Used     Comment: cutting back 3/4 pack  . Alcohol Use: 7.2 oz/week    12 Cans of beer per week  . Drug Use: No  . Sexual Activity: Not on file   Other Topics Concern  . None   Social History Narrative   Review of Systems: Pertinent items are noted in HPI. Objective:  Physical Exam: Filed Vitals:   08/03/14 1449  BP: 157/101    Pulse: 113  Temp: 98.2 F (36.8 C)  TempSrc: Oral  Height: 5\' 4"  (1.626 m)  Weight: 90 lb 9.6 oz (41.096 kg)  SpO2: 98%   General: Sitting in chair, NAD, very cachectic with diffuse muscle wasting Cardiac: RRR, no rubs, murmurs or gallops Pulm: clear to auscultation bilaterally with some minimal expiratory wheezing, moving normal volumes of air Abd: soft, nontender, nondistended, BS present Ext: warm and well perfused, no pedal edema Neuro: alert and oriented X3, cranial nerves II-XII grossly intact  Assessment & Plan:  Please see problem oriented charting  Pt discussed with Dr. Lynnae January

## 2014-08-04 NOTE — Progress Notes (Signed)
Internal Medicine Clinic Attending  Case discussed with Dr. Sadek soon after the resident saw the patient.  We reviewed the resident's history and exam and pertinent patient test results.  I agree with the assessment, diagnosis, and plan of care documented in the resident's note. 

## 2014-08-12 ENCOUNTER — Inpatient Hospital Stay (HOSPITAL_COMMUNITY)
Admission: EM | Admit: 2014-08-12 | Discharge: 2014-08-15 | DRG: 916 | Disposition: A | Discharge status: Home / Self Care | Payer: Medicare Other | Attending: Oncology | Admitting: Oncology

## 2014-08-12 ENCOUNTER — Encounter (HOSPITAL_COMMUNITY): Payer: Self-pay | Admitting: Nurse Practitioner

## 2014-08-12 DIAGNOSIS — Z853 Personal history of malignant neoplasm of breast: Secondary | ICD-10-CM

## 2014-08-12 DIAGNOSIS — J449 Chronic obstructive pulmonary disease, unspecified: Secondary | ICD-10-CM | POA: Diagnosis present

## 2014-08-12 DIAGNOSIS — E871 Hypo-osmolality and hyponatremia: Secondary | ICD-10-CM | POA: Diagnosis present

## 2014-08-12 DIAGNOSIS — Z880 Allergy status to penicillin: Secondary | ICD-10-CM

## 2014-08-12 DIAGNOSIS — Z9981 Dependence on supplemental oxygen: Secondary | ICD-10-CM

## 2014-08-12 DIAGNOSIS — I10 Essential (primary) hypertension: Secondary | ICD-10-CM | POA: Diagnosis present

## 2014-08-12 DIAGNOSIS — Z7982 Long term (current) use of aspirin: Secondary | ICD-10-CM

## 2014-08-12 DIAGNOSIS — F17210 Nicotine dependence, cigarettes, uncomplicated: Secondary | ICD-10-CM | POA: Diagnosis present

## 2014-08-12 DIAGNOSIS — T464X5A Adverse effect of angiotensin-converting-enzyme inhibitors, initial encounter: Secondary | ICD-10-CM | POA: Diagnosis present

## 2014-08-12 DIAGNOSIS — E785 Hyperlipidemia, unspecified: Secondary | ICD-10-CM | POA: Diagnosis present

## 2014-08-12 DIAGNOSIS — Z9013 Acquired absence of bilateral breasts and nipples: Secondary | ICD-10-CM | POA: Diagnosis present

## 2014-08-12 DIAGNOSIS — T783XXA Angioneurotic edema, initial encounter: Secondary | ICD-10-CM | POA: Diagnosis not present

## 2014-08-12 DIAGNOSIS — E875 Hyperkalemia: Secondary | ICD-10-CM | POA: Diagnosis present

## 2014-08-12 DIAGNOSIS — R609 Edema, unspecified: Secondary | ICD-10-CM | POA: Diagnosis not present

## 2014-08-12 DIAGNOSIS — Z720 Tobacco use: Secondary | ICD-10-CM

## 2014-08-12 DIAGNOSIS — F1910 Other psychoactive substance abuse, uncomplicated: Secondary | ICD-10-CM | POA: Diagnosis present

## 2014-08-12 LAB — I-STAT CHEM 8, ED
BUN: 3 mg/dL — AB (ref 6–23)
BUN: 5 mg/dL — AB (ref 6–23)
CHLORIDE: 77 mmol/L — AB (ref 96–112)
CHLORIDE: 78 mmol/L — AB (ref 96–112)
Calcium, Ion: 0.79 mmol/L — ABNORMAL LOW (ref 1.13–1.30)
Calcium, Ion: 0.98 mmol/L — ABNORMAL LOW (ref 1.13–1.30)
Creatinine, Ser: 0.6 mg/dL (ref 0.50–1.10)
Creatinine, Ser: 0.7 mg/dL (ref 0.50–1.10)
GLUCOSE: 96 mg/dL (ref 70–99)
Glucose, Bld: 102 mg/dL — ABNORMAL HIGH (ref 70–99)
HCT: 38 % (ref 36.0–46.0)
HCT: 43 % (ref 36.0–46.0)
Hemoglobin: 12.9 g/dL (ref 12.0–15.0)
Hemoglobin: 14.6 g/dL (ref 12.0–15.0)
POTASSIUM: 6.4 mmol/L — AB (ref 3.5–5.1)
Potassium: 3.2 mmol/L — ABNORMAL LOW (ref 3.5–5.1)
SODIUM: 109 mmol/L — AB (ref 135–145)
SODIUM: 114 mmol/L — AB (ref 135–145)
TCO2: 21 mmol/L (ref 0–100)
TCO2: 24 mmol/L (ref 0–100)

## 2014-08-12 LAB — BASIC METABOLIC PANEL
ANION GAP: 15 (ref 5–15)
BUN: <5 mg/dL — ABNORMAL LOW (ref 6–23)
CHLORIDE: 75 mmol/L — AB (ref 96–112)
CO2: 24 mmol/L (ref 19–32)
Calcium: 8.4 mg/dL (ref 8.4–10.5)
Creatinine, Ser: 0.53 mg/dL (ref 0.50–1.10)
GFR calc Af Amer: >90 mL/min (ref >90)
Glucose, Bld: 85 mg/dL (ref 70–99)
POTASSIUM: 3 mmol/L — AB (ref 3.5–5.1)
SODIUM: 114 mmol/L — AB (ref 135–145)

## 2014-08-12 LAB — CBC WITH DIFFERENTIAL/PLATELET
Basophils Absolute: 0 10*3/uL (ref 0.0–0.1)
Basophils Relative: 1 % (ref 0–1)
Eosinophils Absolute: 0 10*3/uL (ref 0.0–0.7)
Eosinophils Relative: 1 % (ref 0–5)
HCT: 34.3 % — ABNORMAL LOW (ref 36.0–46.0)
Hemoglobin: 12.9 g/dL (ref 12.0–15.0)
LYMPHS PCT: 37 % (ref 12–46)
Lymphs Abs: 1.6 10*3/uL (ref 0.7–4.0)
MCH: 35.9 pg — AB (ref 26.0–34.0)
MCHC: 37.6 g/dL — AB (ref 30.0–36.0)
MCV: 95.5 fL (ref 78.0–100.0)
MONO ABS: 0.3 10*3/uL (ref 0.1–1.0)
Monocytes Relative: 7 % (ref 3–12)
NEUTROS ABS: 2.3 10*3/uL (ref 1.7–7.7)
NEUTROS PCT: 55 % (ref 43–77)
PLATELETS: 383 10*3/uL (ref 150–400)
RBC: 3.59 MIL/uL — ABNORMAL LOW (ref 3.87–5.11)
RDW: 11.7 % (ref 11.5–15.5)
WBC: 4.2 10*3/uL (ref 4.0–10.5)

## 2014-08-12 MED ORDER — SODIUM CHLORIDE 0.9 % IJ SOLN
3.0000 mL | Freq: Two times a day (BID) | INTRAMUSCULAR | Status: DC
  Administered 2014-08-12 – 2014-08-15 (×5): 3 mL via INTRAVENOUS

## 2014-08-12 MED ORDER — MOMETASONE FURO-FORMOTEROL FUM 200-5 MCG/ACT IN AERO
2.0000 | INHALATION_SPRAY | Freq: Two times a day (BID) | RESPIRATORY_TRACT | Status: DC
  Administered 2014-08-13 – 2014-08-14 (×3): 2 via RESPIRATORY_TRACT
  Filled 2014-08-12: qty 8.8

## 2014-08-12 MED ORDER — POTASSIUM CHLORIDE CRYS ER 20 MEQ PO TBCR
40.0000 meq | EXTENDED_RELEASE_TABLET | Freq: Once | ORAL | Status: AC
  Administered 2014-08-12: 40 meq via ORAL
  Filled 2014-08-12: qty 2

## 2014-08-12 MED ORDER — TIOTROPIUM BROMIDE MONOHYDRATE 18 MCG IN CAPS
18.0000 ug | ORAL_CAPSULE | Freq: Every day | RESPIRATORY_TRACT | Status: DC
  Administered 2014-08-13 – 2014-08-14 (×2): 18 ug via RESPIRATORY_TRACT
  Filled 2014-08-12: qty 5

## 2014-08-12 MED ORDER — HEPARIN SODIUM (PORCINE) 5000 UNIT/ML IJ SOLN
5000.0000 [IU] | Freq: Three times a day (TID) | INTRAMUSCULAR | Status: DC
  Administered 2014-08-13 – 2014-08-14 (×3): 5000 [IU] via SUBCUTANEOUS
  Filled 2014-08-12 (×9): qty 1

## 2014-08-12 MED ORDER — METHYLPREDNISOLONE SODIUM SUCC 125 MG IJ SOLR
125.0000 mg | Freq: Once | INTRAMUSCULAR | Status: AC
  Administered 2014-08-12: 125 mg via INTRAVENOUS
  Filled 2014-08-12: qty 2

## 2014-08-12 MED ORDER — DIPHENHYDRAMINE HCL 50 MG/ML IJ SOLN
25.0000 mg | Freq: Once | INTRAMUSCULAR | Status: AC
  Administered 2014-08-12: 25 mg via INTRAVENOUS
  Filled 2014-08-12: qty 1

## 2014-08-12 MED ORDER — SODIUM CHLORIDE 0.9 % IV SOLN
INTRAVENOUS | Status: DC
  Administered 2014-08-12 – 2014-08-13 (×2): via INTRAVENOUS

## 2014-08-12 MED ORDER — FAMOTIDINE IN NACL 20-0.9 MG/50ML-% IV SOLN
20.0000 mg | Freq: Once | INTRAVENOUS | Status: AC
  Administered 2014-08-12: 20 mg via INTRAVENOUS
  Filled 2014-08-12: qty 50

## 2014-08-12 NOTE — ED Notes (Signed)
Attempted report 

## 2014-08-12 NOTE — ED Notes (Signed)
IM residents at bedside

## 2014-08-12 NOTE — ED Notes (Signed)
Possible hemolysis of I-stat; drawn at time of IV stick. Drew a new green tube for re-analysis of i-stat.

## 2014-08-12 NOTE — ED Notes (Signed)
Pt started lisinopril yesterday and woke today with swelling to her lips. She denies trouble breathing or swallowing. She is breathing easily

## 2014-08-12 NOTE — ED Notes (Signed)
Dr Ralene Bathe given a copy of chem 8 results

## 2014-08-12 NOTE — H&P (Signed)
Date: 08/12/2014               Patient Name:  Kayla Tyler MRN: [id number]  DOB: 09-07-1945 Age / Sex: 69 y.o., female   PCP: Jerrye Noble, MD         Medical Service: Internal Medicine Teaching Service         Attending Physician: Dr. Annia Belt, MD    First Contact: Dr. Venita Lick  Pager: [phone]  Second Contact: Dr. Bing Neighbors  Pager: [phone]       After Hours (After 5p/  First Contact Pager: [phone]  weekends / holidays): Second Contact Pager: [phone]   Chief Complaint: Lip swelling  History of Present Illness: Kayla Tyler is a 69 year old female with presumed COPD, hypertension, polysubstance abuse [tobacco, alcohol], presumed cirrhosis who presents with lip swelling. At the time of interview, she was not in respiratory distress and able to verbalize coherently.  This morning, she woke up and noted that she had lip swelling. She attempted to apply heat heating pad to resolve her swelling though there was no improvement, so she decided to present to the ED. She was started on lisinopril/HCTZ 20/25 mg at her most recent office visit 08/03/14 reports having taken only 2 doses since she had filled that medication, most recent dose being yesterday morning. She denies any prior history of a similar reaction or any family members having similar symptoms. Only allergic reaction to medication she reports is penicillins which causes her to "pass out." Otherwise, she reports being able to tolerate only liquids but no solids and denies any shortness of breath or respiratory distress, chest pain, chest tightness, abdominal pain, nausea, vomiting, diarrhea, fever though reports she has had a cough for last 3 days was initially productive with yellow sputum but now is dry. She lives with her son, and she smokes two thirds pack per day, drinks 2-3 beers per day though denies any illicit drug use. In the ED, she was given Benadryl 25 mg IV, Pepcid 20 mg IV, Solumedrol 125 mg  IV.   Meds: Current Facility-Administered Medications  Medication Dose Route Frequency Provider Last Rate Last Dose  . 0.9 %  sodium chloride infusion   Intravenous Continuous Otho Bellows, MD      . heparin injection 5,000 Units  5,000 Units Subcutaneous 3 times per day Otho Bellows, MD      . mometasone-formoterol [facility]) 200-5 MCG/ACT inhaler 2 puff  2 puff Inhalation BID Otho Bellows, MD      . sodium chloride 0.9 % injection 3 mL  3 mL Intravenous Q12H Otho Bellows, MD      . Derrill Memo ON 08/13/2014] tiotropium [facility]) inhalation capsule 18 mcg  18 mcg Inhalation Daily Otho Bellows, MD        Allergies: Allergies as of 08/12/2014 - Review Complete 08/12/2014  Allergen Reaction Noted  . Lisinopril Swelling 08/12/2014  . Penicillins Other (See Comments) 04/30/2013   Past Medical History  Diagnosis Date  . Breast cancer    Past Surgical History  Procedure Laterality Date  . Mastectomy Left    History reviewed. No pertinent family history. History   Social History  . Marital Status: Widowed    Spouse Name: N/A  . Number of Children: N/A  . Years of Education: N/A   Occupational History  . Not on file.   Social History Main Topics  . Smoking status: Current Every Day Smoker -- 1.00 packs/day for  48 years  . Smokeless tobacco: Never Used     Comment: cutting back 3/4 pack  . Alcohol Use: 7.2 oz/week    12 Cans of beer per week  . Drug Use: No  . Sexual Activity: Not on file   Other Topics Concern  . Not on file   Social History Narrative    Review of Systems: As noted the history of present illness  Physical Exam: Blood pressure 116/65, pulse 87, temperature 98.2 F (36.8 C), temperature source Oral, resp. rate 18, SpO2 100 %. General: Cachectic appearing African-American female, resting in bed, NAD HEENT: PERRL, EOMI, scleral icterus, lip swelling is noted below, unable to visualize posterior oropharynx though able to see most of  tongue  Cardiac: RRR, no rubs, murmurs or gallops Pulm: Mild rhonchorous sounds noted overlying the upper posterior lung fields without wheezing Abd: soft, nontender, nondistended, BS present Ext: warm and well perfused, no pedal or tibial edema Neuro: CN II-XII intact, 5/5 upper and lower extremity strength, 2+ grip strength, finger to nose intact    Lab results: Basic Metabolic Panel:  Recent Labs  08/12/14 1900 08/12/14 1908  NA 114* 114*  K 3.0* 3.2*  CL 75* 77*  CO2 24  --   GLUCOSE 85 96  BUN <5* 3*  CREATININE 0.53 0.60  CALCIUM 8.4  --    CBC:  Recent Labs  08/12/14 1850 08/12/14 1908  HGB 14.6 12.9  HCT 43.0 38.0     Other results: EKG: Reviewed and compared with 05/01/2013 Tracing limited by wander Prior EKG notable for diphasic P waves in 2, 3, V1 suggestive of left atrial enlargement   Assessment & Plan by Problem: ACE inhibitor angioedema: Most likely given the fact that she is recent start on this medication and is a known side effect. No known past history or family history of similar reactions is less suggestive of a hereditary disorder. No fever or elevated white count which would be suggestive of infection. She was not in any respiratory distress on time of interview which is reassuring. -Discontinue HCTZ/lisinopril -Repeat EKG tomorrow morning -Monitor on telemetry  Presumed COPD: No PFTs on file though suggested by her smoking history. Pulmonary artery pressure 36 mmHg on echo 05/01/2013. Her medications include Dulera, Spiriva.  -Continue Dulera, Spiriva  Presumed cirrhosis: Noted on her chart by previous providers though unable to find any confirmatory biopsy. CT chest 09/08/2007 notable for subtle irregular hepatic contour which may be suggestive of cirrhosis. Aside from scleral icterus, no signs of stigmata on physical exam suggestive of liver disease. -Check LFTs  Hyponatremia: Initial sodium was 109 though on repeat was 114. She appeared  euvolemic on exam which is thus suggestive of SIADH. COPD is a possible cause. Her active smoking does put her at risk for malignancy and she does have a history of breast cancer status post left mastectomy. No lung nodules noted on most recent chest CT in 2014. Thyroid disorder is also possible though last TSH unremarkable in 2014. -Check orthostatics and start normal saline 100 mL/hour -Recheck BMET -Check magnesium  Polysubstance abuse: As noted in the history of present illness with regards to her smoking and drinking habit. -Advised her to continue cutting back  #FEN:  -Diet: Heart healthy -K-Dur 40 mEq for low K  #DVT prophylaxis: Heparin  #CODE STATUS: FULL CODE -Defer to daughter Ezequiel Kayser [phone] if patients lacks decision-making capacity -Confirmed with patient on admission    Dispo: Disposition is deferred at this time,  awaiting improvement of current medical problems.  The patient does have a current PCP Jerrye Noble, MD) and does need an [facility] hospital follow-up appointment after discharge.  The patient does not know have transportation limitations that hinder transportation to clinic appointments.  Signed: Riccardo Dubin, MD 08/12/2014, 8:57 PM

## 2014-08-12 NOTE — ED Provider Notes (Signed)
CSN: [id number]     Arrival date & time 08/12/14  1640 History   None    No chief complaint on file.    (Consider location/radiation/quality/duration/timing/severity/associated sxs/prior Treatment) Patient is a 69 y.o. female presenting with allergic reaction. The history is provided by the patient. No language interpreter was used.  Allergic Reaction Presenting symptoms: difficulty breathing and swelling   Severity:  Moderate Prior allergic episodes:  Unable to specify Context: medications   Relieved by:  Nothing Worsened by:  Nothing tried Pt complains of swelling to her lips.  Pt reports swelling started 2 days ago and is getting worse.Pt was recently started on lisinopril hct at the medicine clinic.  Pt reports feeling a little short of breath.  Pt reports her throat feels normal  Past Medical History  Diagnosis Date  . Breast cancer    Past Surgical History  Procedure Laterality Date  . Mastectomy Left    No family history on file. History  Substance Use Topics  . Smoking status: Current Every Day Smoker -- 1.00 packs/day for 48 years  . Smokeless tobacco: Never Used     Comment: cutting back 3/4 pack  . Alcohol Use: 7.2 oz/week    12 Cans of beer per week   OB History    No data available     Review of Systems  All other systems reviewed and are negative.     Allergies  Penicillins  Home Medications   Prior to Admission medications   Medication Sig Start Date End Date Taking? Authorizing Provider  levalbuterol [facility] HFA) 45 MCG/ACT inhaler Inhale 2 puffs into the lungs every 4 (four) hours as needed for wheezing. 08/03/14   Jerrye Noble, MD  lisinopril-hydrochlorothiazide (PRINZIDE,ZESTORETIC) 20-25 MG per tablet Take 1 tablet by mouth daily. 08/03/14   Jerrye Noble, MD  mometasone-formoterol (DULERA) 200-5 MCG/ACT AERO Inhale 2 puffs into the lungs 2 (two) times daily. 08/03/14   Jerrye Noble, MD  tiotropium (SPIRIVA HANDIHALER) 18 MCG inhalation capsule  Place 1 capsule (18 mcg total) into inhaler and inhale daily. 08/03/14   Jerrye Noble, MD   There were no vitals taken for this visit. Physical Exam  Constitutional: She appears well-developed and well-nourished.  HENT:  Head: Normocephalic and atraumatic.  Eyes: Conjunctivae are normal. Pupils are equal, round, and reactive to light.  Neck: Normal range of motion. Neck supple.  Cardiovascular: Normal rate and normal heart sounds.   Pulmonary/Chest: Effort normal and breath sounds normal.  Abdominal: Soft.  Musculoskeletal: Normal range of motion.  Neurological: She is alert.  Skin: Skin is warm.  Psychiatric: She has a normal mood and affect.  Nursing note and vitals reviewed.   ED Course  Procedures (including critical care time) Labs Review Labs Reviewed - No data to display  Imaging Review No results found.   EKG Interpretation None      MDM  Pt given Iv solumedrol, pepcid and benadryl.  Labs returned hemolyses I stat. BMet shows k of 3.0 and na of 114.   Final diagnoses:  Angioedema, initial encounter  Hyponatremia    INternal Medicine resident will see here for admission.   Fransico Meadow, PA-C 08/12/14 2054  [place], PA-C 08/12/14 2101  Quintella Reichert, MD 08/12/14 2137

## 2014-08-12 NOTE — Progress Notes (Signed)
Pt has arrived to room. Pt is currently resting comfortably. Admission will be completed now.

## 2014-08-13 ENCOUNTER — Inpatient Hospital Stay (HOSPITAL_COMMUNITY): Payer: Medicare Other

## 2014-08-13 DIAGNOSIS — F17210 Nicotine dependence, cigarettes, uncomplicated: Secondary | ICD-10-CM | POA: Diagnosis present

## 2014-08-13 DIAGNOSIS — Z880 Allergy status to penicillin: Secondary | ICD-10-CM | POA: Diagnosis not present

## 2014-08-13 DIAGNOSIS — K7460 Unspecified cirrhosis of liver: Secondary | ICD-10-CM | POA: Diagnosis not present

## 2014-08-13 DIAGNOSIS — R609 Edema, unspecified: Secondary | ICD-10-CM | POA: Diagnosis present

## 2014-08-13 DIAGNOSIS — T464X5A Adverse effect of angiotensin-converting-enzyme inhibitors, initial encounter: Secondary | ICD-10-CM | POA: Diagnosis present

## 2014-08-13 DIAGNOSIS — Z7982 Long term (current) use of aspirin: Secondary | ICD-10-CM | POA: Diagnosis not present

## 2014-08-13 DIAGNOSIS — F1910 Other psychoactive substance abuse, uncomplicated: Secondary | ICD-10-CM | POA: Diagnosis present

## 2014-08-13 DIAGNOSIS — J449 Chronic obstructive pulmonary disease, unspecified: Secondary | ICD-10-CM | POA: Diagnosis present

## 2014-08-13 DIAGNOSIS — E785 Hyperlipidemia, unspecified: Secondary | ICD-10-CM | POA: Diagnosis present

## 2014-08-13 DIAGNOSIS — E871 Hypo-osmolality and hyponatremia: Secondary | ICD-10-CM | POA: Diagnosis present

## 2014-08-13 DIAGNOSIS — Z853 Personal history of malignant neoplasm of breast: Secondary | ICD-10-CM | POA: Diagnosis not present

## 2014-08-13 DIAGNOSIS — T783XXA Angioneurotic edema, initial encounter: Principal | ICD-10-CM

## 2014-08-13 DIAGNOSIS — I10 Essential (primary) hypertension: Secondary | ICD-10-CM | POA: Diagnosis present

## 2014-08-13 DIAGNOSIS — Z9981 Dependence on supplemental oxygen: Secondary | ICD-10-CM | POA: Diagnosis not present

## 2014-08-13 DIAGNOSIS — E875 Hyperkalemia: Secondary | ICD-10-CM | POA: Diagnosis present

## 2014-08-13 DIAGNOSIS — Z9013 Acquired absence of bilateral breasts and nipples: Secondary | ICD-10-CM | POA: Diagnosis present

## 2014-08-13 LAB — BASIC METABOLIC PANEL
Anion gap: 15 (ref 5–15)
Anion gap: 8 (ref 5–15)
Anion gap: 15 (ref 5–15)
Anion gap: 6 (ref 5–15)
BUN: <5 mg/dL — ABNORMAL LOW (ref 6–23)
BUN: <5 mg/dL — ABNORMAL LOW (ref 6–23)
BUN: 7 mg/dL (ref 6–23)
BUN: 7 mg/dL (ref 6–23)
CHLORIDE: 83 mmol/L — AB (ref 96–112)
CHLORIDE: 85 mmol/L — AB (ref 96–112)
CHLORIDE: 88 mmol/L — AB (ref 96–112)
CO2: 23 mmol/L (ref 19–32)
CO2: 25 mmol/L (ref 19–32)
CO2: 20 mmol/L (ref 19–32)
CO2: 26 mmol/L (ref 19–32)
CREATININE: 0.73 mg/dL (ref 0.50–1.10)
Calcium: 8.7 mg/dL (ref 8.4–10.5)
Calcium: 8.5 mg/dL (ref 8.4–10.5)
Calcium: 8.3 mg/dL — ABNORMAL LOW (ref 8.4–10.5)
Calcium: 8.3 mg/dL — ABNORMAL LOW (ref 8.4–10.5)
Chloride: 75 mmol/L — ABNORMAL LOW (ref 96–112)
Creatinine, Ser: 0.74 mg/dL (ref 0.50–1.10)
Creatinine, Ser: 0.66 mg/dL (ref 0.50–1.10)
Creatinine, Ser: 0.86 mg/dL (ref 0.50–1.10)
GFR calc Af Amer: 78 mL/min — ABNORMAL LOW (ref >90)
GFR calc Af Amer: >90 mL/min (ref >90)
GFR calc non Af Amer: 87 mL/min — ABNORMAL LOW (ref >90)
GFR calc non Af Amer: 67 mL/min — ABNORMAL LOW (ref >90)
GFR calc non Af Amer: 85 mL/min — ABNORMAL LOW (ref >90)
GFR, EST NON AFRICAN AMERICAN: 84 mL/min — AB (ref >90)
GLUCOSE: 135 mg/dL — AB (ref 70–99)
GLUCOSE: 132 mg/dL — AB (ref 70–99)
Glucose, Bld: 186 mg/dL — ABNORMAL HIGH (ref 70–99)
Glucose, Bld: 75 mg/dL (ref 70–99)
POTASSIUM: 4 mmol/L (ref 3.5–5.1)
POTASSIUM: 4.1 mmol/L (ref 3.5–5.1)
Potassium: 3.3 mmol/L — ABNORMAL LOW (ref 3.5–5.1)
Potassium: 3.8 mmol/L (ref 3.5–5.1)
SODIUM: 113 mmol/L — AB (ref 135–145)
Sodium: 116 mmol/L — CL (ref 135–145)
Sodium: 120 mmol/L — ABNORMAL LOW (ref 135–145)
Sodium: 120 mmol/L — ABNORMAL LOW (ref 135–145)

## 2014-08-13 LAB — CREATININE, URINE, RANDOM: CREATININE, URINE: 24.08 mg/dL

## 2014-08-13 LAB — HEPATIC FUNCTION PANEL
ALK PHOS: 264 U/L — AB (ref 39–117)
ALT: 23 U/L (ref 0–35)
AST: 54 U/L — ABNORMAL HIGH (ref 0–37)
Albumin: 2.8 g/dL — ABNORMAL LOW (ref 3.5–5.2)
BILIRUBIN INDIRECT: 0.8 mg/dL (ref 0.3–0.9)
BILIRUBIN TOTAL: 1.2 mg/dL (ref 0.3–1.2)
Bilirubin, Direct: 0.4 mg/dL (ref 0.0–0.5)
TOTAL PROTEIN: 7.9 g/dL (ref 6.0–8.3)

## 2014-08-13 LAB — SODIUM, URINE, RANDOM: Sodium, Ur: <10 mmol/L

## 2014-08-13 LAB — MAGNESIUM: Magnesium: 1.5 mg/dL (ref 1.5–2.5)

## 2014-08-13 LAB — OSMOLALITY: OSMOLALITY: 241 mosm/kg — AB (ref 275–300)

## 2014-08-13 LAB — TSH: TSH: 0.605 u[IU]/mL (ref 0.350–4.500)

## 2014-08-13 LAB — OSMOLALITY, URINE: Osmolality, Ur: 108 mOsm/kg — ABNORMAL LOW (ref 390–1090)

## 2014-08-13 MED ORDER — FAMOTIDINE 20 MG PO TABS
20.0000 mg | ORAL_TABLET | Freq: Every day | ORAL | Status: DC
  Administered 2014-08-13 – 2014-08-15 (×3): 20 mg via ORAL
  Filled 2014-08-13 (×3): qty 1

## 2014-08-13 MED ORDER — MAGNESIUM SULFATE 2 GM/50ML IV SOLN
2.0000 g | Freq: Once | INTRAVENOUS | Status: AC
  Administered 2014-08-13: 2 g via INTRAVENOUS
  Filled 2014-08-13: qty 50

## 2014-08-13 MED ORDER — IBUPROFEN 400 MG PO TABS
400.0000 mg | ORAL_TABLET | Freq: Once | ORAL | Status: AC
  Administered 2014-08-13: 400 mg via ORAL
  Filled 2014-08-13: qty 1

## 2014-08-13 MED ORDER — POTASSIUM CHLORIDE CRYS ER 20 MEQ PO TBCR
40.0000 meq | EXTENDED_RELEASE_TABLET | Freq: Once | ORAL | Status: AC
  Administered 2014-08-13: 40 meq via ORAL
  Filled 2014-08-13: qty 2

## 2014-08-13 MED ORDER — IBUPROFEN 400 MG PO TABS
400.0000 mg | ORAL_TABLET | ORAL | Status: DC | PRN
  Administered 2014-08-13: 400 mg via ORAL
  Filled 2014-08-13 (×2): qty 1

## 2014-08-13 NOTE — Progress Notes (Signed)
UR completed 

## 2014-08-13 NOTE — Progress Notes (Signed)
Subjective:  Patient was seen and examined this morning. Patient states lip swelling has reduced some. She denies any trouble swallowing or trouble breathing. No tongue swelling.   Objective: Vital signs in last 24 hours: Filed Vitals:   08/12/14 2301 08/13/14 0213 08/13/14 0544 08/13/14 0924  BP: 120/78 128/62 122/64   Pulse: 103 98 92   Temp:  97.6 F (36.4 C) 97.3 F (36.3 C)   TempSrc:  Oral Oral   Resp:  16 16   Height:      Weight:      SpO2:  100% 98% 99%   Weight change:   Intake/Output Summary (Last 24 hours) at 08/13/14 1347 Last data filed at 08/13/14 1345  Gross per 24 hour  Intake    700 ml  Output    925 ml  Net   -225 ml   General: Vital signs reviewed.  Patient is thin appearing, cachetic, in no acute distress and cooperative with exam.  HEENT: Edematous superior and inferior lips from angioedema. No tongue or uvula edema. No stridor.  Cardiovascular: RRR Pulmonary/Chest: Clear to auscultation bilaterally, no wheezes, rales, or rhonchi. Abdominal: Soft, non-tender, non-distended, BS + Extremities: No lower extremity edema bilaterally Neurological: A&O x3 Skin: Warm, dry and intact. No rashes or erythema. Psychiatric: Normal mood and affect. speech and behavior is normal. Cognition and memory are normal.   Lab Results: Basic Metabolic Panel:  Recent Labs Lab 08/13/14 0100 08/13/14 0450  NA 113* 116*  K 4.0 4.1  CL 75* 83*  CO2 23 25  GLUCOSE 186* 135*  BUN <5* <5*  CREATININE 0.74 0.66  CALCIUM 8.7 8.5  MG 1.5  --    Liver Function Tests:  Recent Labs Lab 08/13/14 0100  AST 54*  ALT 23  ALKPHOS 264*  BILITOT 1.2  PROT 7.9  ALBUMIN 2.8*   CBC:  Recent Labs Lab 08/12/14 1900 08/12/14 1908  WBC 4.2  --   NEUTROABS 2.3  --   HGB 12.9 12.9  HCT 34.3* 38.0  MCV 95.5  --   PLT 383  --    Thyroid Function Tests:  Recent Labs Lab 08/13/14 0450  TSH 0.605   Medications:  I have reviewed the patient's current  medications. Prior to Admission:  Prescriptions prior to admission  Medication Sig Dispense Refill Last Dose  . Aspirin-Acetaminophen-Caffeine (GOODY HEADACHE PO) Take 1 packet by mouth daily as needed (pain).   08/11/2014 at Unknown time  . levalbuterol (XOPENEX HFA) 45 MCG/ACT inhaler Inhale 2 puffs into the lungs every 4 (four) hours as needed for wheezing. 1 Inhaler 12 08/12/2014 at Unknown time  . tiotropium (SPIRIVA HANDIHALER) 18 MCG inhalation capsule Place 1 capsule (18 mcg total) into inhaler and inhale daily. 30 capsule 12 08/12/2014 at Unknown time  . lisinopril-hydrochlorothiazide (PRINZIDE,ZESTORETIC) 20-25 MG per tablet Take 1 tablet by mouth daily. (Patient not taking: Reported on 08/12/2014) 30 tablet 3 Not Taking at Unknown time  . mometasone-formoterol (DULERA) 200-5 MCG/ACT AERO Inhale 2 puffs into the lungs 2 (two) times daily. (Patient not taking: Reported on 08/12/2014) 1 Inhaler 11 Not Taking at Unknown time   Scheduled Meds: . famotidine  20 mg Oral Daily  . heparin  5,000 Units Subcutaneous 3 times per day  . mometasone-formoterol  2 puff Inhalation BID  . sodium chloride  3 mL Intravenous Q12H  . tiotropium  18 mcg Inhalation Daily   Continuous Infusions: . sodium chloride 100 mL/hr at 08/13/14 0515   PRN  Meds:. Assessment/Plan: Principal Problem:   Angioedema of lips Active Problems:   Hyperlipidemia   Smoking 1/2 pack a day or less   COPD (chronic obstructive pulmonary disease)   Essential hypertension   Hyponatremia  ACEI Angioedema: Improving. Primarily lip edema, no tongue or uvula edema. No stridor, dysphagia, odynophagia, or trouble breathing. Patient received solumedrol, famotidine, and benadryl yesterday. Antihistamines, benadryl and glucorticoids are not effective in ACEI induced angioedema as it does not alter bradykinin levels. Some trials have shown a benefit with antihistamines.  -Discontinue HCTZ/lisinopril -Continue famotidine 20 mg  daily  Presumed COPD: No PFTs on file though suggested by her smoking history. Pulmonary artery pressure 36 mmHg on echo 05/01/2013. Her home medications include Dulera and Spiriva.  -Continue Dulera 2 puffs BID -Continue Spiriva daily  Presumed Cirrhosis: Noted on her chart by previous providers though unable to find any confirmatory biopsy. CT chest 09/08/2007 notable for subtle irregular hepatic contour which may be suggestive of cirrhosis. Diffuse fatty infiltration of the liver noted on US abdomen/pelvis 2006. AST 54, ALT 23, Alk Phos 264.  -Repeat CMET tomorrow am   Euvolemic Hypotonic Hyponatremia: Sodium 114>113>116. No prior history of hyponatremia, last 132 on 04/2013. She appeared euvolemic on exam and serum osmolality is low at 241. Orthostatics negative. Magnesium level normal. TSH level normal. Urine osmolality is still pending, but differential includes SIADH, glucocorticoid deficiency, primary polydipsia, low solute. Patient has low urine sodium at less than 10 (normally around 20). Patient was recently started on HCTZ in February, but her picture does not fit with hypovolemia hypotonic hyponatremia from renal losses as urine sodium is low (typically higher than 20 in this) and her FENa is <1% (and typically greater than 1%) in this. Patient may have hypovolemic hypotonic hyponatremia secondary to extrarenal losses from inadequate intake since urine sodium less than 10 and FENa is <1%. Our goal is to correct her sodium at a rate of <0.5 mEq/L/hr. If sodium is >121 on recheck, we will decrease NS infusion rate.   -NS 100 mL/hour -Recheck BMET Q8H for 4 occurences -Urine osmolality pending -ADH pending  Polysubstance Abuse: Patient admits to smoking 1/2 ppd for 35 years and drinking 48 oz of beer a day.  -Counseled on cessation  FEN:  -Diet: Heart healthy  DVT prophylaxis: Heparin SQ TID  CODE STATUS: FULL CODE -Defer to daughter Ezequiel Kayser [phone] if patients lacks  decision-making capacity -Confirmed with patient on admission  Dispo: Disposition is deferred at this time, awaiting improvement of current medical problems.  Anticipated discharge in approximately 1-2 day(s).   The patient does have a current PCP Jerrye Noble, MD) and does need an [facility] hospital follow-up appointment after discharge.  The patient does not have transportation limitations that hinder transportation to clinic appointments.  .Services Needed at time of discharge: Y = Yes, Blank = No PT:   OT:   RN:   Equipment:   Other:     LOS: 0 days   Osa Craver, DO PGY-1 Internal Medicine Resident Pager # [phone] 08/13/2014 1:47 PM

## 2014-08-13 NOTE — Progress Notes (Signed)
Patient ID: Kayla Tyler, female   DOB: 1945-08-07, 69 y.o.   MRN: [id number] Medicine attending: I have reviewed the progress note by medical student Ms. Tora Duck and attest to its accuracy. Clinical management discussed.

## 2014-08-13 NOTE — Progress Notes (Signed)
Principal Problem:   Angioedema of lips Active Problems:   Hyperlipidemia   Smoking 1/2 pack a day or less   COPD (chronic obstructive pulmonary disease)   Essential hypertension   Hyponatremia      Code Status Orders        Start     Ordered   08/12/14 2234  Full code   Continuous     08/12/14 2233      Length of Stay (days):   SUBJECTIVE/24 HOUR EVENTS: 69 y.o. female with PMH significant for HTN, COPD, hyperlipidemia, hepatic cirrhosis, breast cancer w/ bilateral mastectomy and chemotherapy, alcohol and tobacco abuse who presented yesterday with superior and inferior lip swelling in the setting of recently initiated ACEi. Patient denies any difficulty breathing or swallowing, denies cough and has had no other symptoms. On laboratory workup in the ED she was found to be hyponatremic. Patient is currently undergoing workup for the cause of the low sodium level.   OBJECTIVE: Filed Vitals:   08/12/14 2301 08/13/14 0213 08/13/14 0544 08/13/14 0924  BP: 120/78 128/62 122/64   Pulse: 103 98 92   Temp:  97.6 F (36.4 C) 97.3 F (36.3 C)   TempSrc:  Oral Oral   Resp:  16 16   Height:      Weight:      SpO2:  100% 98% 99%    Intake/Output Summary (Last 24 hours) at 08/13/14 1134 Last data filed at 08/13/14 0854  Gross per 24 hour  Intake    480 ml  Output    925 ml  Net   -445 ml    Intake/Output last 3 shifts: I/O last 3 completed shifts: In: 240 [P.O.:240] Out: 325 [Urine:325]  Allergies  Allergen Reactions  . Lisinopril Swelling    Lip swelling (reaction to lisinopril/hctz)  . Penicillins Other (See Comments)    Pt passes out.    Medications: Scheduled Meds: . heparin  5,000 Units Subcutaneous 3 times per day  . mometasone-formoterol  2 puff Inhalation BID  . sodium chloride  3 mL Intravenous Q12H  . tiotropium  18 mcg Inhalation Daily   Continuous Infusions: . sodium chloride 100 mL/hr at 08/13/14 0515   PRN Meds:.  Physical Exam: GEN: NAD,  AAOx3, thin  HEENT: MMM, EOMI, PERRLA, b/l sclera anicteric, no conjunctival injection, no lymphadenopathy, arcus senilis, angioedema of the inferior and superior lips. No tongue/throat swelling. No difficulty swallowing.  NECK: Supple, no JVD, no carotid bruits, no thyromegaly  CV: RRR, S1S2nl, no murmurs/rubs/gallops PULM: clear to auscultation b/l, no rales/rhonchi/wheezes, nl percussion. Bilateral mastectomy scars.  ABD: normal/active bowel sounds, soft, ND/NT, no rebound, no guarding, no CVA tenderness, no hepatosplenomegaly EXT: thin, no cyanosis, clubbing or edema  NEURO: no focal deficits  PSYCH: nl affect, nl speech MSK: nl ROM, no joint swelling or erythema  Skin: warm, appears dry. Intact. No rashes or erythema. No signs of trauma.    Labs: Recent Labs     08/12/14  1900  08/12/14  1908  08/13/14  0100  08/13/14  0450  HGB  12.9  12.9   --    --   HCT  34.3*  38.0   --    --   PLT  383   --    --    --   NA  114*  114*  113*  116*  K  3.0*  3.2*  4.0  4.1  CL  75*  77*  75*  83*  CO2  24   --   23  25  BUN  <5*  3*  <5*  <5*  CREATININE  0.53  0.60  0.74  0.66  CALCIUM  8.4   --   8.7  8.5    CBC Latest Ref Rng 08/12/2014 08/12/2014 08/12/2014  WBC 4.0 - 10.5 K/uL - 4.2 -  Hemoglobin 12.0 - 15.0 g/dL 12.9 12.9 14.6  Hematocrit 36.0 - 46.0 % 38.0 34.3(L) 43.0  Platelets 150 - 400 K/uL - 383 -    BMP Latest Ref Rng 08/13/2014 08/13/2014 08/12/2014  Glucose 70 - 99 mg/dL 135(H) 186(H) 96  BUN 6 - 23 mg/dL <5(L) <5(L) 3(L)  Creatinine 0.50 - 1.10 mg/dL 0.66 0.74 0.60  Sodium 135 - 145 mmol/L 116(LL) 113(LL) 114(LL)  Potassium 3.5 - 5.1 mmol/L 4.1 4.0 3.2(L)  Chloride 96 - 112 mmol/L 83(L) 75(L) 77(L)  CO2 19 - 32 mmol/L 25 23 -  Calcium 8.4 - 10.5 mg/dL 8.5 8.7 -    CMP Latest Ref Rng 08/13/2014 08/13/2014 08/12/2014  Glucose 70 - 99 mg/dL 135(H) 186(H) 96  BUN 6 - 23 mg/dL <5(L) <5(L) 3(L)  Creatinine 0.50 - 1.10 mg/dL 0.66 0.74 0.60  Sodium 135 - 145 mmol/L  116(LL) 113(LL) 114(LL)  Potassium 3.5 - 5.1 mmol/L 4.1 4.0 3.2(L)  Chloride 96 - 112 mmol/L 83(L) 75(L) 77(L)  CO2 19 - 32 mmol/L 25 23 -  Calcium 8.4 - 10.5 mg/dL 8.5 8.7 -  Total Protein 6.0 - 8.3 g/dL - 7.9 -  Total Bilirubin 0.3 - 1.2 mg/dL - 1.2 -  Alkaline Phos 39 - 117 U/L - 264(H) -  AST 0 - 37 U/L - 54(H) -  ALT 0 - 35 U/L - 23 -   Lab Results  Component Value Date   TSH 0.605 08/13/2014   Magnesium (08/13/2014): 1.5   Hepatic Function Latest Ref Rng 08/13/2014 05/09/2013 05/08/2013  Total Protein 6.0 - 8.3 g/dL 7.9 6.6 8.1  Albumin 3.5 - 5.2 g/dL 2.8(L) 1.9(L) 2.4(L)  AST 0 - 37 U/L 54(H) 38(H) 52(H)  ALT 0 - 35 U/L 23 34 45(H)  Alk Phosphatase 39 - 117 U/L 264(H) 277(H) 317(H)  Total Bilirubin 0.3 - 1.2 mg/dL 1.2 0.6 0.8  Bilirubin, Direct 0.0 - 0.5 mg/dL 0.4 - -   Urine Creatinine (08/13/2014): 24.08 mg/dL   Urine Sodium (08/13/2014): <10 mmol/L   Serum and Urine Osmolality labs pending.    Images: No new images.   Medications, Vitals, Labs, and Images reviewed.   ASSESSMENT AND PLAN: 69 y.o. female w/  PMH significant for HTN, COPD, hyperlipidemia, hepatic cirrhosis, breast cancer w/ bilateral mastectomy and chemotherapy, alcohol and tobacco abuse with superior and inferior lip swelling most likely due to recently initiated ACEi. Lisinopril-HCTZ has been discontinued. On laboratory workup in the ED she was found to be hyponatremic. Patient is currently undergoing workup for the cause of the low sodium level. Possible causes include Beer potomania given her alcohol use (48oz per day) and probable malnutrition, SIADH given her smoking history and history of breast cancer with lymph node involvement, primary polydipsia although patient doesn't appear hypervolemic, and cirrhosis (albumin low and alk phos elevated, AST slightly elevated but not as high as you would expect for alcohol use causing liver failure severe enough to cause this hyponatremia), or renal failure  (although BUN low and creatine w/in normal limits). Other less likely options that are also on the differential include hypothyroidism (although TSH w/in normal  limits), glucocorticoid or mineralocorticoid deficiency.

## 2014-08-13 NOTE — Progress Notes (Signed)
CRITICAL VALUE ALERT  Critical value received:  Na 113   Date of notification:  08/13/14  Time of notification:  0200  Critical value read back:Yes.    Nurse who received alert:  Lysle Rubens, RN   MD notified (1st page):  Internal Medicine Teaching Service  Time of first page:  0203  Responding MD:  Dr. Posey Pronto  Time MD responded: [phone]

## 2014-08-14 DIAGNOSIS — I10 Essential (primary) hypertension: Secondary | ICD-10-CM

## 2014-08-14 DIAGNOSIS — E785 Hyperlipidemia, unspecified: Secondary | ICD-10-CM

## 2014-08-14 DIAGNOSIS — Z720 Tobacco use: Secondary | ICD-10-CM

## 2014-08-14 LAB — BASIC METABOLIC PANEL
Anion gap: 5 (ref 5–15)
Anion gap: 6 (ref 5–15)
Anion gap: 7 (ref 5–15)
Anion gap: 10 (ref 5–15)
BUN: 5 mg/dL — AB (ref 6–23)
BUN: <5 mg/dL — ABNORMAL LOW (ref 6–23)
BUN: <5 mg/dL — ABNORMAL LOW (ref 6–23)
CO2: 25 mmol/L (ref 19–32)
CO2: 25 mmol/L (ref 19–32)
CO2: 26 mmol/L (ref 19–32)
CO2: 26 mmol/L (ref 19–32)
CREATININE: 0.71 mg/dL (ref 0.50–1.10)
Calcium: 8.4 mg/dL (ref 8.4–10.5)
Calcium: 8.5 mg/dL (ref 8.4–10.5)
Calcium: 8.5 mg/dL (ref 8.4–10.5)
Calcium: 8.1 mg/dL — ABNORMAL LOW (ref 8.4–10.5)
Chloride: 96 mmol/L (ref 96–112)
Chloride: 97 mmol/L (ref 96–112)
Chloride: 93 mmol/L — ABNORMAL LOW (ref 96–112)
Chloride: 89 mmol/L — ABNORMAL LOW (ref 96–112)
Creatinine, Ser: 0.6 mg/dL (ref 0.50–1.10)
Creatinine, Ser: 0.62 mg/dL (ref 0.50–1.10)
Creatinine, Ser: 0.66 mg/dL (ref 0.50–1.10)
GFR calc Af Amer: >90 mL/min (ref >90)
GFR calc Af Amer: >90 mL/min (ref >90)
GFR calc non Af Amer: 85 mL/min — ABNORMAL LOW (ref >90)
GFR, EST NON AFRICAN AMERICAN: 89 mL/min — AB (ref >90)
GFR, EST NON AFRICAN AMERICAN: 87 mL/min — AB (ref >90)
GLUCOSE: 82 mg/dL (ref 70–99)
GLUCOSE: 93 mg/dL (ref 70–99)
Glucose, Bld: 82 mg/dL (ref 70–99)
Glucose, Bld: 94 mg/dL (ref 70–99)
POTASSIUM: 4.5 mmol/L (ref 3.5–5.1)
POTASSIUM: 3.5 mmol/L (ref 3.5–5.1)
Potassium: 4.5 mmol/L (ref 3.5–5.1)
Potassium: 3.6 mmol/L (ref 3.5–5.1)
SODIUM: 127 mmol/L — AB (ref 135–145)
SODIUM: 126 mmol/L — AB (ref 135–145)
Sodium: 127 mmol/L — ABNORMAL LOW (ref 135–145)
Sodium: 125 mmol/L — ABNORMAL LOW (ref 135–145)

## 2014-08-14 LAB — CBC
HEMATOCRIT: 31.5 % — AB (ref 36.0–46.0)
Hemoglobin: 11.4 g/dL — ABNORMAL LOW (ref 12.0–15.0)
MCH: 36 pg — AB (ref 26.0–34.0)
MCHC: 36.2 g/dL — AB (ref 30.0–36.0)
MCV: 99.4 fL (ref 78.0–100.0)
PLATELETS: 198 10*3/uL (ref 150–400)
RBC: 3.17 MIL/uL — ABNORMAL LOW (ref 3.87–5.11)
RDW: 11.9 % (ref 11.5–15.5)
WBC: 6.4 10*3/uL (ref 4.0–10.5)

## 2014-08-14 MED ORDER — LORAZEPAM 2 MG/ML IJ SOLN: 1.0000 mg | Freq: Four times a day (QID) | INTRAMUSCULAR | Status: DC | PRN

## 2014-08-14 MED ORDER — FOLIC ACID 1 MG PO TABS
1.0000 mg | ORAL_TABLET | Freq: Every day | ORAL | Status: DC
  Administered 2014-08-14 – 2014-08-15 (×2): 1 mg via ORAL
  Filled 2014-08-14 (×2): qty 1

## 2014-08-14 MED ORDER — VITAMIN B-1 100 MG PO TABS
100.0000 mg | ORAL_TABLET | Freq: Every day | ORAL | Status: DC
  Administered 2014-08-14 – 2014-08-15 (×2): 100 mg via ORAL
  Filled 2014-08-14 (×2): qty 1

## 2014-08-14 MED ORDER — LORAZEPAM 1 MG PO TABS
1.0000 mg | ORAL_TABLET | Freq: Four times a day (QID) | ORAL | Status: DC | PRN
Start: 2014-08-14 — End: 2014-08-15
  Administered 2014-08-14: 1 mg via ORAL
  Filled 2014-08-14: qty 1

## 2014-08-14 MED ORDER — THIAMINE HCL 100 MG/ML IJ SOLN
100.0000 mg | Freq: Every day | INTRAMUSCULAR | Status: DC
  Filled 2014-08-14 (×2): qty 1

## 2014-08-14 MED ORDER — ENOXAPARIN SODIUM 40 MG/0.4ML ~~LOC~~ SOLN
40.0000 mg | SUBCUTANEOUS | Status: DC
  Filled 2014-08-14: qty 0.4

## 2014-08-14 MED ORDER — ADULT MULTIVITAMIN W/MINERALS CH
1.0000 | ORAL_TABLET | Freq: Every day | ORAL | Status: DC
  Administered 2014-08-14 – 2014-08-15 (×2): 1 via ORAL
  Filled 2014-08-14 (×2): qty 1

## 2014-08-14 MED ORDER — ENOXAPARIN SODIUM 30 MG/0.3ML ~~LOC~~ SOLN
30.0000 mg | SUBCUTANEOUS | Status: DC
  Administered 2014-08-14: 30 mg via SUBCUTANEOUS
  Filled 2014-08-14 (×2): qty 0.3

## 2014-08-14 MED ORDER — LORAZEPAM 0.5 MG PO TABS
0.5000 mg | ORAL_TABLET | Freq: Once | ORAL | Status: AC
  Administered 2014-08-14: 0.5 mg via ORAL
  Filled 2014-08-14: qty 1

## 2014-08-14 MED ORDER — SODIUM CHLORIDE 0.9 % IV SOLN
INTRAVENOUS | Status: DC
  Administered 2014-08-14: 21:00:00 via INTRAVENOUS

## 2014-08-14 MED ORDER — DEXTROSE 5 % IV SOLN
INTRAVENOUS | Status: DC
  Administered 2014-08-14: 08:00:00 via INTRAVENOUS

## 2014-08-14 NOTE — Progress Notes (Signed)
Patient ID: Kayla Tyler, female   DOB: Dec 07, 1945, 69 y.o.   MRN: [id number] Medicine attending: I examined this woman this morning together with resident physician Dr. Osa Craver and medical student Ms. Tora Duck  and I concur with their evaluation and management plan. Angioedema of the lips now almost completely resolved. Sodium level is coming up with cautious replacement. It seems likely that her hyponatremia is dilutional  in nature secondary to increased fluid intake primarily with beer. We discussed this with her this morning. Chest radiograph shows signs of obstructive airway disease but no sign of a pulmonary malignancy. In addition, low urine sodium argues against SIADH. If she continues to make progress, we will likely be able to discharge her tomorrow.

## 2014-08-14 NOTE — Progress Notes (Signed)
Patient alert oriented x4, denies pain, no shortrness of breath, v/s stable, SR on the monitor. Will continue to monitor patient.

## 2014-08-14 NOTE — Progress Notes (Signed)
Subjective:  Patient was seen and examined this morning. Patient denies any complaints.She states her lips are no longer swollen and back at baseline. She denies any trouble breathing or swallowing. Upon discussion concerning her hyponatremia, patient states her diet consists of lunch with a salad and/or sandwich and a soda. She also cooks a large meal for dinner with meat, potatoes, casseroles, etc. She cooks Sunday dinner for her family every week as well. She does drink about 48 ounces of beer a day, but no more than that. She denies drinking excessive fluids. She drinks a cup of juice or water in the morning, a soda at lunch, her beer, and maybe a cup of water or 2 throughout the day.   Objective: Vital signs in last 24 hours: Filed Vitals:   08/13/14 1523 08/13/14 1951 08/14/14 0053 08/14/14 0459  BP: 131/73 115/62 164/81 114/59  Pulse: 82 81 96 94  Temp: 97.6 F (36.4 C) 98.5 F (36.9 C) 97.7 F (36.5 C) 98.5 F (36.9 C)  TempSrc: Oral Oral Oral Oral  Resp: $Remo'16 16 14 16  'BSREV$ Height:      Weight:    93 lb 4.8 oz (42.321 kg)  SpO2: 100% 100% 100% 99%   Weight change: 2 lb 4.8 oz (1.043 kg)  Intake/Output Summary (Last 24 hours) at 08/14/14 1101 Last data filed at 08/14/14 0900  Gross per 24 hour  Intake 3660.42 ml  Output   1300 ml  Net 2360.42 ml   General: Vital signs reviewed.  Patient is thin appearing, cachetic, in no acute distress and cooperative with exam.  HEENT: Angioedema resolved. No tongue or uvula edema. No stridor.  Cardiovascular: RRR Pulmonary/Chest: Clear to auscultation bilaterally, no wheezes, rales, or rhonchi. Abdominal: Soft, non-tender, non-distended, BS + Extremities: No lower extremity edema bilaterally Neurological: A&O x3 Skin: Warm, dry and intact. No rashes or erythema. Psychiatric: Normal mood and affect. speech and behavior is normal. Cognition and memory are normal.   Lab Results: Basic Metabolic Panel:  Recent Labs Lab 08/13/14 0100   08/13/14 2015 08/14/14 0515  NA 113*  < > 120* 127*  K 4.0  < > 3.8 4.5  CL 75*  < > 88* 96  CO2 23  < > 26 25  GLUCOSE 186*  < > 75 82  BUN <5*  < > 7 5*  CREATININE 0.74  < > 0.73 0.62  CALCIUM 8.7  < > 8.3* 8.5  MG 1.5  --   --   --   < > = values in this interval not displayed. Liver Function Tests:  Recent Labs Lab 08/13/14 0100  AST 54*  ALT 23  ALKPHOS 264*  BILITOT 1.2  PROT 7.9  ALBUMIN 2.8*   CBC:  Recent Labs Lab 08/12/14 1900 08/12/14 1908 08/14/14 0515  WBC 4.2  --  6.4  NEUTROABS 2.3  --   --   HGB 12.9 12.9 11.4*  HCT 34.3* 38.0 31.5*  MCV 95.5  --  99.4  PLT 383  --  198   Thyroid Function Tests:  Recent Labs Lab 08/13/14 0450  TSH 0.605   Medications:  I have reviewed the patient's current medications. Prior to Admission:  Prescriptions prior to admission  Medication Sig Dispense Refill Last Dose  . Aspirin-Acetaminophen-Caffeine (GOODY HEADACHE PO) Take 1 packet by mouth daily as needed (pain).   08/11/2014 at Unknown time  . levalbuterol (XOPENEX HFA) 45 MCG/ACT inhaler Inhale 2 puffs into the lungs every 4 (four)  hours as needed for wheezing. 1 Inhaler 12 08/12/2014 at Unknown time  . tiotropium (SPIRIVA HANDIHALER) 18 MCG inhalation capsule Place 1 capsule (18 mcg total) into inhaler and inhale daily. 30 capsule 12 08/12/2014 at Unknown time  . lisinopril-hydrochlorothiazide (PRINZIDE,ZESTORETIC) 20-25 MG per tablet Take 1 tablet by mouth daily. (Patient not taking: Reported on 08/12/2014) 30 tablet 3 Not Taking at Unknown time  . mometasone-formoterol (DULERA) 200-5 MCG/ACT AERO Inhale 2 puffs into the lungs 2 (two) times daily. (Patient not taking: Reported on 08/12/2014) 1 Inhaler 11 Not Taking at Unknown time   Scheduled Meds: . enoxaparin (LOVENOX) injection  30 mg Subcutaneous Q24H  . famotidine  20 mg Oral Daily  . mometasone-formoterol  2 puff Inhalation BID  . sodium chloride  3 mL Intravenous Q12H  . tiotropium  18 mcg Inhalation  Daily   Continuous Infusions: . dextrose 100 mL/hr at 08/14/14 0824   PRN Meds:. Assessment/Plan: Principal Problem:   Angioedema of lips Active Problems:   Hyperlipidemia   Smoking 1/2 pack a day or less   COPD (chronic obstructive pulmonary disease)   Essential hypertension   Hyponatremia  ACEI Angioedema: Resolved.  -Discontinued HCTZ/lisinopril -Continue famotidine 20 mg daily  Euvolemic Hypotonic Hyponatremia: Sodium 114>113>116>120>127 this morning. Euvolemic, serum osmolality low at 241, urine sodium low at <10, urine osmolality low at 108, and FENa is <1%. Patient likely has euvolemic hypotonic hyponatremia secondary to low solute (beer potomania or tea and toast diet), but her history does not support this. Patient may have hypovolemic hypotonic hyponatremia secondary to extrarenal losses from inadequate intake since urine sodium less than 10 and FENa is <1%. Our goal is to correct her sodium at a rate of <0.5 mEq/L/hr or more conservatively by 8 mEq within 24 hours. Patient did well yesterday on NS 100 cc/hr. I decreased rate to 50 cc/hr at 1500 since Na was 120. It was later increased by night team to 75 cc/hr. This morning sodium is a little higher at 127 than we would want.  -D5W 100 cc/hr -Recheck BMET at 1200 -ADH pending -Restart NS based on 1200 labs  COPD: No PFTs on file though suggested by her smoking history. Pulmonary artery pressure 36 mmHg on echo 05/01/2013. Her home medications include Dulera and Spiriva. Patient is satting 99-100% on room air.  -Continue Dulera 2 puffs BID -Continue Spiriva daily  Presumed Cirrhosis: Noted on her chart by previous providers though unable to find any confirmatory biopsy. CT chest 09/08/2007 notable for subtle irregular hepatic contour which may be suggestive of cirrhosis. Diffuse fatty infiltration of the liver noted on US abdomen/pelvis 2006. AST 54, ALT 23, Alk Phos 264.  -Follow up as outpatient   Polysubstance Abuse:  Patient admits to smoking 1/2 ppd for 35 years and drinking 48 oz of beer a day.  -Counseled on cessation  FEN:  -Diet: Heart healthy  DVT prophylaxis: Lovenox SQ QD  CODE STATUS: FULL CODE -Defer to daughter Ezequiel Kayser [phone] if patients lacks decision-making capacity -Confirmed with patient on admission  Dispo: Disposition is deferred at this time, awaiting improvement of current medical problems.  Anticipated discharge in approximately 1-2 day(s).   The patient does have a current PCP Jerrye Noble, MD) and does need an [facility] hospital follow-up appointment after discharge.  The patient does not have transportation limitations that hinder transportation to clinic appointments.  .Services Needed at time of discharge: Y = Yes, Blank = No PT:   OT:   RN:  Equipment:   Other:     LOS: 1 day   Osa Craver, DO PGY-1 Internal Medicine Resident Pager # [phone] 08/14/2014 11:01 AM

## 2014-08-14 NOTE — Progress Notes (Signed)
Principal Problem:   Angioedema of lips Active Problems:   Hyperlipidemia   Smoking 1/2 pack a day or less   COPD (chronic obstructive pulmonary disease)   Essential hypertension   Hyponatremia      Code Status Orders        Start     Ordered   08/12/14 2234  Full code   Continuous     08/12/14 2233      Length of Stay (days):1   SUBJECTIVE/24 HOUR EVENTS: 69 y.o. female with PMH significant for HTN, COPD, hyperlipidemia, hepatic cirrhosis, breast cancer w/ bilateral mastectomy and chemotherapy, alcohol and tobacco abuse who presented 2 days ago with superior and inferior lip swelling in the setting of recently initiated ACEi. Lip swelling has significantly improved and patient continues to deny any difficulty breathing or swallowing. She also denies cough, shortness of breath, chest pain, continued swelling and has had no other symptoms. On laboratory workup in the ED she was found to be hyponatremic and has since been diagnosed with euvolemic hypotonic hyponatremia most likely due to beer potomania in the setting of her alcohol use, low weight and probable malnutrition. Patient's chest x-ray was unremarkable and urine osmolality was decreased, making SIADH less likely (lab still pending). Will continue to slowly correct her hyponatremia and counsel the patient on decreasing alcohol intake and maintaining a proper diet adequate in salt and other electrolytes.    OBJECTIVE: Filed Vitals:   08/13/14 1523 08/13/14 1951 08/14/14 0053 08/14/14 0459  BP: 131/73 115/62 164/81 114/59  Pulse: 82 81 96 94  Temp: 97.6 F (36.4 C) 98.5 F (36.9 C) 97.7 F (36.5 C) 98.5 F (36.9 C)  TempSrc: Oral Oral Oral Oral  Resp: $Remo'16 16 14 16  'cuAud$ Height:      Weight:    42.321 kg (93 lb 4.8 oz)  SpO2: 100% 100% 100% 99%    Intake/Output Summary (Last 24 hours) at 08/14/14 1328 Last data filed at 08/14/14 1000  Gross per 24 hour  Intake 3820.42 ml  Output   1300 ml  Net 2520.42 ml     Intake/Output last 3 shifts: I/O last 3 completed shifts: In: 3900.4 [P.O.:1510; I.V.:2390.4] Out: 2225 [Urine:2225]  Allergies  Allergen Reactions  . Lisinopril Swelling    Lip swelling (reaction to lisinopril/hctz)  . Penicillins Other (See Comments)    Pt passes out.    Medications: Scheduled Meds: . enoxaparin (LOVENOX) injection  30 mg Subcutaneous Q24H  . famotidine  20 mg Oral Daily  . mometasone-formoterol  2 puff Inhalation BID  . sodium chloride  3 mL Intravenous Q12H  . tiotropium  18 mcg Inhalation Daily   Continuous Infusions: . dextrose 100 mL/hr at 08/14/14 1000   PRN Meds:.ibuprofen  Physical Exam: GEN: NAD, AAOx3, thin  HEENT: MMM, EOMI, PERRLA, b/l sclera anicteric, no conjunctival injection, no lymphadenopathy, arcus senilis, angioedema of the inferior and superior lips significantly improved. No tongue/throat swelling. No difficulty swallowing.  NECK: Supple, no JVD, no carotid bruits, no thyromegaly  CV: RRR, S1S2nl, no murmurs/rubs/gallops PULM: clear to auscultation b/l, no rales/rhonchi/wheezes, nl percussion. Bilateral mastectomy scars.  ABD: normal/active bowel sounds, soft, ND/NT, no rebound, no guarding, no CVA tenderness, no hepatosplenomegaly EXT: thin, no cyanosis, clubbing or edema  NEURO: no focal deficits  PSYCH: nl affect, nl speech MSK: nl ROM, no joint swelling or erythema  Skin: warm, appears dry. Intact. No rashes or erythema. No signs of trauma.    Labs: Recent Labs  08/12/14  1900  08/12/14  1908   08/14/14  0515  08/14/14  1204  HGB  12.9  12.9   --   11.4*   --   HCT  34.3*  38.0   --   31.5*   --   PLT  383   --    --   198   --   NA  114*  114*   < >  127*  126*  K  3.0*  3.2*   < >  4.5  3.6  CL  75*  77*   < >  96  93*  CO2  24   --    < >  25  26  BUN  <5*  3*   < >  5*  <5*  CREATININE  0.53  0.60   < >  0.62  0.66  CALCIUM  8.4   --    < >  8.5  8.5   < > = values in this interval not displayed.     CBC Latest Ref Rng 08/14/2014 08/12/2014 08/12/2014  WBC 4.0 - 10.5 K/uL 6.4 - 4.2  Hemoglobin 12.0 - 15.0 g/dL 11.4(L) 12.9 12.9  Hematocrit 36.0 - 46.0 % 31.5(L) 38.0 34.3(L)  Platelets 150 - 400 K/uL 198 - 383    BMP Latest Ref Rng 08/14/2014 08/14/2014 08/13/2014  Glucose 70 - 99 mg/dL 93 82 75  BUN 6 - 23 mg/dL <5(L) 5(L) 7  Creatinine 0.50 - 1.10 mg/dL 0.66 0.62 0.73  Sodium 135 - 145 mmol/L 126(L) 127(L) 120(L)  Potassium 3.5 - 5.1 mmol/L 3.6 4.5 3.8  Chloride 96 - 112 mmol/L 93(L) 96 88(L)  CO2 19 - 32 mmol/L $RemoveB'26 25 26  'bqnMfgOA$ Calcium 8.4 - 10.5 mg/dL 8.5 8.5 8.3(L)    CMP Latest Ref Rng 08/14/2014 08/14/2014 08/13/2014  Glucose 70 - 99 mg/dL 93 82 75  BUN 6 - 23 mg/dL <5(L) 5(L) 7  Creatinine 0.50 - 1.10 mg/dL 0.66 0.62 0.73  Sodium 135 - 145 mmol/L 126(L) 127(L) 120(L)  Potassium 3.5 - 5.1 mmol/L 3.6 4.5 3.8  Chloride 96 - 112 mmol/L 93(L) 96 88(L)  CO2 19 - 32 mmol/L $RemoveB'26 25 26  'uXmhMYNN$ Calcium 8.4 - 10.5 mg/dL 8.5 8.5 8.3(L)  Total Protein 6.0 - 8.3 g/dL - - -  Total Bilirubin 0.3 - 1.2 mg/dL - - -  Alkaline Phos 39 - 117 U/L - - -  AST 0 - 37 U/L - - -  ALT 0 - 35 U/L - - -   Lab Results  Component Value Date   TSH 0.605 08/13/2014   Hepatic Function Latest Ref Rng 08/13/2014 05/09/2013 05/08/2013  Total Protein 6.0 - 8.3 g/dL 7.9 6.6 8.1  Albumin 3.5 - 5.2 g/dL 2.8(L) 1.9(L) 2.4(L)  AST 0 - 37 U/L 54(H) 38(H) 52(H)  ALT 0 - 35 U/L 23 34 45(H)  Alk Phosphatase 39 - 117 U/L 264(H) 277(H) 317(H)  Total Bilirubin 0.3 - 1.2 mg/dL 1.2 0.6 0.8  Bilirubin, Direct 0.0 - 0.5 mg/dL 0.4 - -   Urine Osmolality: 108L (low)   Random Urine Sodium: <10 mmol/L   Arginine Vasopressin hormone level pending.   Images: Chest X-ray (08/13/2014): Chronic hyperinflation and calcified atherosclerosis of the aorta. No acute cardiopulmonary abnormality. Osteopenia, no acute osseous abnormality identified. Postoperative changes to the left axilla and chest wall appears stable (post-mastectomy).    Medications, Vitals, Labs, and Images reviewed.   ASSESSMENT AND PLAN: 69 y.o.  female w/ PMH significant for HTN, COPD, hyperlipidemia, hepatic cirrhosis, breast cancer w/ bilateral mastectomy and chemotherapy, alcohol and tobacco abuse with superior and inferior lip swelling most likely due to recently initiated ACEi. Lisinopril-HCTZ has been discontinued. On laboratory workup in the ED she was found to be hyponatremic. Urine and serum osmolality show decreased osmolality in the setting of euvolemic hypotonic hyponatremia. Given her alcohol use (48oz per day) combined with her age, low weight and probable malnutrition, beer potomania is the most likely etiology underlying her hyponatremia. SIADH is less likely given the decreased urine osmolality and unremarkable chest x-ray; however, vasopressin level is pending to rule out this as a possible etiology given her smoking history and history of breast cancer with lymph node involvement. Liver failure, renal failure, hypothyroidism or glucocorticoid deficiency are not supported in this patient as possible etiologies given her unremarkable lab work and physical findings.        ACEi induced angioedema: Lip swelling has significantly improved. No throat swelling, no difficulty swallowing or breathing. Lisinpril-HCTZ has been discontinued and ACEi has been added to her allergy list.  - Continue famotidine.   Euvolemic Hypotonic Hyponatremia: Serum sodium continues to increase (was 109 on admission, is now at 126). Was taken off of normal saline for caution with correcting sodium too quickly and was switched to D5W. BMET scheduled for 4:00pm today - will decide on whether or not to switch back to normal saline based on her levels at that time.  - continue D5W  - BMET recheck at 4:00pm - ADH level pending   Alcohol and Tobacco Abuse: patient smokes 1/2 ppd and has smoked for 35 years. She admits to drinking 2, 24oz cans of beer per day.  - Counsel  patient on cessation and risks of drinking beer daily without proper dietary intake of sodium and other electrolytes.  - Nutritional counseling   COPD: continue home meds.   Cirrhosis: hepatic function panel revealed decreased albumin (2.8), mildly elevated AST (54) and increased alkaline phosphatase (264)  Diet: Heart Healthy   DVT prophylaxis: Heparin subcu TID  Patient planned for follow up with PCP for lab recheck on 08/21/2014 at 2:15pm.

## 2014-08-15 LAB — BASIC METABOLIC PANEL WITH GFR
Anion gap: 4 — ABNORMAL LOW (ref 5–15)
BUN: <5 mg/dL — ABNORMAL LOW (ref 6–23)
CO2: 27 mmol/L (ref 19–32)
Calcium: 8.2 mg/dL — ABNORMAL LOW (ref 8.4–10.5)
Chloride: 97 mmol/L (ref 96–112)
Creatinine, Ser: 0.53 mg/dL (ref 0.50–1.10)
GFR calc Af Amer: >90 mL/min
GFR calc non Af Amer: >90 mL/min
Glucose, Bld: 79 mg/dL (ref 70–99)
Potassium: 4.6 mmol/L (ref 3.5–5.1)
Sodium: 128 mmol/L — ABNORMAL LOW (ref 135–145)

## 2014-08-15 MED ORDER — FOLIC ACID 1 MG PO TABS: 1.0000 mg | ORAL_TABLET | Freq: Every day | ORAL | Status: DC

## 2014-08-15 MED ORDER — THIAMINE HCL 100 MG PO TABS
100.0000 mg | ORAL_TABLET | Freq: Every day | ORAL | Status: DC
Start: 2014-08-15 — End: 2015-02-05

## 2014-08-15 NOTE — Care Management Note (Signed)
    Page 1 of 1   08/15/2014     11:35:34 AM CARE MANAGEMENT NOTE 08/15/2014  Patient:  Kayla Tyler, Kayla Tyler   Account Number:  [id number]  Date Initiated:  08/15/2014  Documentation initiated by:  Neev Mcmains  Subjective/Objective Assessment:   Pt adm on 08/12/14 with angioedema, hyponatremia.  PTA, pt resides at home with son and is independent.     Action/Plan:   No dc needs identified.  Pt stable for dc today.   Anticipated DC Date:  08/15/2014   Anticipated DC Plan:  [place]  CM consult      Choice offered to / List presented to:             Status of service:  Completed, signed off Medicare Important Message given?  YES (If response is "NO", the following Medicare IM given date fields will be blank) Date Medicare IM given:  08/15/2014 Medicare IM given by:  Paizlee Kinder Date Additional Medicare IM given:   Additional Medicare IM given by:    Discharge Disposition:  HOME/SELF CARE  Per UR Regulation:  Reviewed for med. necessity/level of care/duration of stay  If discussed at [place] of Stay Meetings, dates discussed:    Comments:

## 2014-08-15 NOTE — Discharge Summary (Signed)
Name: Kayla Tyler MRN: [id number] DOB: 29-Aug-1945 69 y.o. PCP: Jerrye Noble, MD  Date of Admission: 08/12/2014  4:43 PM Date of Discharge: 08/15/14 Attending Physician: Dr. Beryle Beams  Discharge Diagnosis:  Principal Problem:   Angioedema of lips Active Problems:   Hyperlipidemia   Smoking 1/2 pack a day or less   COPD (chronic obstructive pulmonary disease)   Essential hypertension   Hyponatremia  Discharge Medications:   Medication List    STOP taking these medications        GOODY HEADACHE PO     lisinopril-hydrochlorothiazide 20-25 MG per tablet  Commonly known as:  PRINZIDE,ZESTORETIC      TAKE these medications        folic acid 1 MG tablet  Commonly known as:  FOLVITE  Take 1 tablet (1 mg total) by mouth daily.     levalbuterol 45 MCG/ACT inhaler  Commonly known as:  XOPENEX HFA  Inhale 2 puffs into the lungs every 4 (four) hours as needed for wheezing.     mometasone-formoterol 200-5 MCG/ACT Aero  Commonly known as:  DULERA  Inhale 2 puffs into the lungs 2 (two) times daily.     thiamine 100 MG tablet  Take 1 tablet (100 mg total) by mouth daily.     tiotropium 18 MCG inhalation capsule  Commonly known as:  SPIRIVA HANDIHALER  Place 1 capsule (18 mcg total) into inhaler and inhale daily.        Disposition and follow-up:   Kayla Tyler was discharged from [facility] in Good condition.  At the hospital follow up visit please address:  1.  Angioedema: Please assess she has had no further recurrence. She is not taking lisinopril.  HTN: Control off of HCTZ and lisinopril.   Euvolemic Hypotonic Hyponatremia: Please assess sodium and increased po intake/decreased beer intake.   2.  Labs / imaging needed at time of follow-up: BMET  3.  Pending labs/ test needing follow-up: None  Follow-up Appointments: Follow-up Information    Follow up with Kayla Felling, MD On 08/21/2014.   Specialty:  Internal Medicine   Why:  2:15 pm  f/up for repeat lab work to recheck hyponatremia   Contact information:   [place] [address] [phone]       Discharge Instructions: Discharge Instructions    Diet - low sodium heart healthy    Complete by:  As directed      Increase activity slowly    Complete by:  As directed            Consultations:  None  Procedures Performed:  Dg Chest 2 View  08/13/2014   CLINICAL DATA:  69 year old female with hyponatremia. Initial encounter. Current history of COPD. Left side breast cancer.  EXAM: CHEST  2 VIEW  COMPARISON:  05/08/2013.  FINDINGS: Postoperative changes to the left axilla and chest wall appears stable. Stable large lung volumes. Stable cardiac size and mediastinal contours ; stable tortuous thoracic aorta with calcified plaque. No pneumothorax, pulmonary edema, pleural effusion or confluent pulmonary opacity. Osteopenia. No acute osseous abnormality identified.  IMPRESSION: Chronic hyperinflation and calcified atherosclerosis of the aorta. No acute cardiopulmonary abnormality.   Electronically Signed   By: Genevie Ann M.D.   On: 08/13/2014 21:46   Admission HPI: Kayla Tyler is a 69 year old female with presumed COPD, hypertension, polysubstance abuse [tobacco, alcohol], presumed cirrhosis who presents with lip swelling. At the time of interview, she was not in  respiratory distress and able to verbalize coherently.  This morning, she woke up and noted that she had lip swelling. She attempted to apply heat heating pad to resolve her swelling though there was no improvement, so she decided to present to the ED. She was started on lisinopril/HCTZ 20/25 mg at her most recent office visit 08/03/14 reports having taken only 2 doses since she had filled that medication, most recent dose being yesterday morning. She denies any prior history of a similar reaction or any family members having similar symptoms. Only allergic reaction to medication she reports is penicillins which  causes her to "pass out." Otherwise, she reports being able to tolerate only liquids but no solids and denies any shortness of breath or respiratory distress, chest pain, chest tightness, abdominal pain, nausea, vomiting, diarrhea, fever though reports she has had a cough for last 3 days was initially productive with yellow sputum but now is dry. She lives with her son, and she smokes two thirds pack per day, drinks 2-3 beers per day though denies any illicit drug use. In the ED, she was given Benadryl 25 mg IV, Pepcid 20 mg IV, Solumedrol 125 mg IV.  Hospital Course by problem list: Principal Problem:   Angioedema of lips Active Problems:   Hyperlipidemia   Smoking 1/2 pack a day or less   COPD (chronic obstructive pulmonary disease)   Essential hypertension   Hyponatremia   ACEI Angioedema: Most likely given the fact that she is recently started on lisinopril. No known past history or family history of similar reactions is less suggestive of a hereditary disorder. No fever or elevated white count which would be suggestive of infection. She was not in any respiratory distress on time of interview which is reassuring. Angioedema was greatly improved the day following admission and completely resolved by discharge. Please ensure patient is no longer taking lisinopril.   Euvolemic Hypotonic Hyponatremia: Presented with a sodium of 113. Work up was consistent with low solute (beer potomania or tea and toast diet). Patient was euvolemic, low serum osmolality, low urine sodium, low urine osmolality, FENa <1%. Discharge sodium was 128. Patient was educated on dietary changes and decreasing alcohol intake. Please recheck BMET on follow up.  COPD: Her home medications include Dulera and Spiriva. Patient is satting 99-100% on room air. We continued her Dulera 2 puffs BID and Spiriva daily.  Presumed Cirrhosis: Noted on her chart by previous providers though unable to find any confirmatory biopsy. CT chest  09/08/2007 notable for subtle irregular hepatic contour which may be suggestive of cirrhosis. Diffuse fatty infiltration of the liver noted on US abdomen/pelvis 2006. AST 54, ALT 23, Alk Phos 264. Please follow up as outpatient as warranted.  Polysubstance Abuse: Patient admits to smoking 1/2 ppd for 35 years and drinking 48 oz of beer a day. Patient was counseled on cessation but encouraged to at least cut back.  Discharge Vitals:   BP 128/67 mmHg  Pulse 82  Temp(Src) 98 F (36.7 C) (Oral)  Resp 16  Ht $R'5\' 4"'BY$  (1.626 m)  Wt 92 lb 14.4 oz (42.139 kg)  BMI 15.94 kg/m2  SpO2 98%  Signed: Osa Craver, DO PGY-1 Internal Medicine Resident Pager # [phone] 08/16/2014 7:57 PM

## 2014-08-15 NOTE — Progress Notes (Signed)
Subjective:  Patient was seen and examined this morning. Patient denies any complaints. She denies trouble breathing or swallowing, no chest pain, shortness of breath, nausea or vomiting. No more swelling.   Objective: Vital signs in last 24 hours: Filed Vitals:   08/14/14 1740 08/14/14 2100 08/14/14 2246 08/15/14 0504  BP: 123/64 134/71  140/84  Pulse: 86 86  82  Temp:  97.7 F (36.5 C)  97.9 F (36.6 C)  TempSrc:  Oral  Oral  Resp:  16  16  Height:      Weight:    92 lb 14.4 oz (42.139 kg)  SpO2:  98% 95% 98%   Weight change: -6.4 oz (-0.181 kg)  Intake/Output Summary (Last 24 hours) at 08/15/14 0926 Last data filed at 08/15/14 0505  Gross per 24 hour  Intake   1680 ml  Output   1575 ml  Net    105 ml   General: Vital signs reviewed.  Patient is thin appearing, cachetic, in no acute distress and cooperative with exam.  HEENT: Angioedema resolved. No tongue or uvula edema.  Cardiovascular: RRR Pulmonary/Chest: Clear to auscultation bilaterally, no wheezes, rales, or rhonchi. Abdominal: Soft, non-tender, non-distended, BS + Extremities: No lower extremity edema bilaterally. Thin extremities. Neurological: A&O x3 Skin: Warm, dry and intact. No rashes or erythema. Psychiatric: Normal mood and affect. speech and behavior is normal. Cognition and memory are normal.   Lab Results: Basic Metabolic Panel:  Recent Labs Lab 08/13/14 0100  08/14/14 1908 08/15/14 0300  NA 113*  < > 125* 128*  K 4.0  < > 3.5 4.6  CL 75*  < > 89* 97  CO2 23  < > 26 27  GLUCOSE 186*  < > 94 79  BUN <5*  < > <5* <5*  CREATININE 0.74  < > 0.71 0.53  CALCIUM 8.7  < > 8.1* 8.2*  MG 1.5  --   --   --   < > = values in this interval not displayed. Liver Function Tests:  Recent Labs Lab 08/13/14 0100  AST 54*  ALT 23  ALKPHOS 264*  BILITOT 1.2  PROT 7.9  ALBUMIN 2.8*   CBC:  Recent Labs Lab 08/12/14 1900 08/12/14 1908 08/14/14 0515  WBC 4.2  --  6.4  NEUTROABS 2.3  --   --     HGB 12.9 12.9 11.4*  HCT 34.3* 38.0 31.5*  MCV 95.5  --  99.4  PLT 383  --  198   Thyroid Function Tests:  Recent Labs Lab 08/13/14 0450  TSH 0.605   Medications:  I have reviewed the patient's current medications. Prior to Admission:  Prescriptions prior to admission  Medication Sig Dispense Refill Last Dose  . Aspirin-Acetaminophen-Caffeine (GOODY HEADACHE PO) Take 1 packet by mouth daily as needed (pain).   08/11/2014 at Unknown time  . levalbuterol (XOPENEX HFA) 45 MCG/ACT inhaler Inhale 2 puffs into the lungs every 4 (four) hours as needed for wheezing. 1 Inhaler 12 08/12/2014 at Unknown time  . tiotropium (SPIRIVA HANDIHALER) 18 MCG inhalation capsule Place 1 capsule (18 mcg total) into inhaler and inhale daily. 30 capsule 12 08/12/2014 at Unknown time  . lisinopril-hydrochlorothiazide (PRINZIDE,ZESTORETIC) 20-25 MG per tablet Take 1 tablet by mouth daily. (Patient not taking: Reported on 08/12/2014) 30 tablet 3 Not Taking at Unknown time  . mometasone-formoterol (DULERA) 200-5 MCG/ACT AERO Inhale 2 puffs into the lungs 2 (two) times daily. (Patient not taking: Reported on 08/12/2014) 1 Inhaler 11 Not  Taking at Unknown time   Scheduled Meds: . enoxaparin (LOVENOX) injection  30 mg Subcutaneous Q24H  . famotidine  20 mg Oral Daily  . folic acid  1 mg Oral Daily  . mometasone-formoterol  2 puff Inhalation BID  . multivitamin with minerals  1 tablet Oral Daily  . sodium chloride  3 mL Intravenous Q12H  . thiamine  100 mg Oral Daily   Or  . thiamine  100 mg Intravenous Daily  . tiotropium  18 mcg Inhalation Daily   Continuous Infusions: . sodium chloride 50 mL/hr at 08/14/14 2054   PRN Meds:. Assessment/Plan: Principal Problem:   Angioedema of lips Active Problems:   Hyperlipidemia   Smoking 1/2 pack a day or less   COPD (chronic obstructive pulmonary disease)   Essential hypertension   Hyponatremia  ACEI Angioedema: Resolved.  -Discontinued HCTZ/lisinopril- now  listed as an allergy -Continue famotidine 20 mg daily  Euvolemic Hypotonic Hyponatremia: Consistent with low solute (beer potomania or tea and toast diet). Given increased from 116 to 127 over 24 hours, patient was put on D5W 100 cc/hr for 12 hours yesterday. Repeat BMET at 1900 showed Na of 125. Patient was resumed on NS 50 cc/hr overnight. This morning, sodium was 128. We will continue NS this morning and recheck this afternoon with plans to discharge this afternoon and follow up in our clinic on Monday for repeat BMET. Patient was educated on dietary changes and decreasing alcohol intake.  -Recheck BMET at 1200 -ADH pending -NS 50 cc/ hr -Discharge this afternoon -Follow up in the clinic on Monday  COPD: Her home medications include Dulera and Spiriva. Patient is satting 99-100% on room air.  -Continue Dulera 2 puffs BID -Continue Spiriva daily  Presumed Cirrhosis: Noted on her chart by previous providers though unable to find any confirmatory biopsy. CT chest 09/08/2007 notable for subtle irregular hepatic contour which may be suggestive of cirrhosis. Diffuse fatty infiltration of the liver noted on US abdomen/pelvis 2006. AST 54, ALT 23, Alk Phos 264.  -Follow up as outpatient   Polysubstance Abuse: Patient admits to smoking 1/2 ppd for 35 years and drinking 48 oz of beer a day.  -Counseled on cessation  FEN:  -Diet: Heart healthy  DVT prophylaxis: Lovenox SQ QD  CODE STATUS: FULL CODE -Defer to daughter Ezequiel Kayser [phone] if patients lacks decision-making capacity -Confirmed with patient on admission  Dispo: Disposition is deferred at this time, awaiting improvement of current medical problems.  Anticipated discharge in approximately 1-2 day(s).   The patient does have a current PCP Jerrye Noble, MD) and does need an [facility] hospital follow-up appointment after discharge.  The patient does not have transportation limitations that hinder transportation to clinic  appointments.  .Services Needed at time of discharge: Y = Yes, Blank = No PT:   OT:   RN:   Equipment:   Other:     LOS: 2 days   Osa Craver, DO PGY-1 Internal Medicine Resident Pager # [phone] 08/15/2014 9:26 AM

## 2014-08-15 NOTE — Progress Notes (Signed)
At 1120 all d/c instructions explained and given to pt by charge nurse Tai.  D/c off floor via w/c to awaiting transport to home.

## 2014-08-15 NOTE — Progress Notes (Signed)
Patient alert oriented, denies pain, shortness. Iv , tele d/c. V/s stable, 98.0, hr.83, bp 128/67. Lab draw b met per md..D/c  Instruction given , patient verbalized understanding. Patient d/c per order.

## 2014-08-15 NOTE — Discharge Instructions (Signed)
Thank you for allowing [place] to be involved in your healthcare while you were hospitalized at [facility].   Please note that there have been changes to your home medications.  --> PLEASE LOOK AT YOUR DISCHARGE MEDICATION LIST FOR DETAILS.  Please call your PCP if you have any questions or concerns, or any difficulty getting any of your medications.  Please return to the ER if you have worsening of your symptoms or new severe symptoms arise.   STOP TAKING YOUR LISINOPRIL AND HYDROCHLOROTHIAZIDE  MAKE SURE YOU EAT BALANCED DIET AND DECREASE BEER INTAKE  PLEASE FOLLOW UP WITH INTERNAL MEDICINE CLINIC ON Monday April 4TH AS LISTED IN PACKET   Angioedema Angioedema is sudden puffiness (swelling), often of the skin. It can happen:  On your face or privates (genitals).  In your belly (abdomen) or other body parts. It usually happens quickly and gets better in 1 or 2 days. It often starts at night and is found when you wake up. You may get red, itchy patches of skin (hives). Attacks can be dangerous if your breathing passages get puffy. The condition may happen only once, or it can come back at random times. It may happen for several years before it goes away for good. HOME CARE  Only take medicines as told by your doctor.  Always carry your emergency allergy medicines with you.  Wear a medical bracelet as told by your doctor.  Avoid things that you know will cause attacks (triggers). GET HELP IF:  You have another attack.  Your attacks happen more often or get worse.  The condition was passed to you by your parents and you want to have children. GET HELP RIGHT AWAY IF:   Your mouth, tongue, or lips are very puffy.  You have trouble breathing.  You have trouble swallowing.  You pass out (faint). MAKE SURE YOU:   Understand these instructions.  Will watch your condition.  Will get help right away if you are not doing well or get worse. Document  Released: 04/22/2009 Document Revised: 02/22/2013 Document Reviewed: 12/26/2012 [facility] Patient Information 2015 [place], [place]. This information is not intended to replace advice given to you by your health care provider. Make sure you discuss any questions you have with your health care provider.   Hyponatremia  Hyponatremia is when the salt (sodium) in your blood is low. When salt becomes low, your cells take in extra water and puff up (swell). The puffiness can happen in the whole body. It mostly affects the brain and is very serious.  HOME CARE  Only take medicine as told by your doctor.  Follow any diet instructions you were given. This includes limiting how much fluid you drink.  Keep all doctor visits for tests as told.  Avoid alcohol and drugs. GET HELP RIGHT AWAY IF:  You start to twitch and shake (seize).  You pass out (faint).  You continue to have watery poop (diarrhea) or you throw up (vomit).  You feel sick to your stomach (nauseous).  You are tired (fatigued), have a headache, are confused, or feel weak.  Your problems that first brought you to the doctor come back.  You have trouble following your diet instructions. MAKE SURE YOU:   Understand these instructions.  Will watch your condition.  Will get help right away if you are not doing well or get worse. Document Released: 01/14/2011 Document Revised: 07/27/2011 Document Reviewed: 01/14/2011 [facility] Patient Information 2015 [place], [place]. This information is  not intended to replace advice given to you by your health care provider. Make sure you discuss any questions you have with your health care provider.

## 2014-08-15 NOTE — Progress Notes (Signed)
Principal Problem:   Angioedema of lips Active Problems:   Hyperlipidemia   Smoking 1/2 pack a day or less   COPD (chronic obstructive pulmonary disease)   Essential hypertension   Hyponatremia      Code Status Orders        Start     Ordered   08/12/14 2234  Full code   Continuous     08/12/14 2233      Length of Stay (days):2   SUBJECTIVE/24 HOUR EVENTS: Patient's angioedema of the lips due to ACE inhibitor use has almost completely resolved. She continues to deny swelling, difficulty swallowing, shortness of breath, chest pain, fever, chills, nausea, vomiting, diarrhea. Patient is ready for discharge and was counseled by Dr. Beryle Beams and Dr. Marvel Plan on diet/salt recommendations and alcohol cessation/minimalization to normalize and control her hyponatremia in order to prevent complications in the future such as seizures. The patient stated that she understood these recommendations and will follow up in the outpatient clinic on Monday. Normal saline has been continued and a final BMET before discharge will be checked to ensure that her sodium is continuing to slowly normalize.   OBJECTIVE: Filed Vitals:   08/14/14 1740 08/14/14 2100 08/14/14 2246 08/15/14 0504  BP: 123/64 134/71  140/84  Pulse: 86 86  82  Temp:  97.7 F (36.5 C)  97.9 F (36.6 C)  TempSrc:  Oral  Oral  Resp:  16  16  Height:      Weight:    42.139 kg (92 lb 14.4 oz)  SpO2:  98% 95% 98%    Intake/Output Summary (Last 24 hours) at 08/15/14 1049 Last data filed at 08/15/14 0900  Gross per 24 hour  Intake   1760 ml  Output   1875 ml  Net   -115 ml    Intake/Output last 3 shifts: I/O last 3 completed shifts: In: 4540.4 [P.O.:1190; I.V.:3350.4] Out: 2425 [Urine:2425]  Allergies  Allergen Reactions  . Lisinopril Swelling    Lip swelling (reaction to lisinopril/hctz)  . Penicillins Other (See Comments)    Pt passes out.    Medications: Scheduled Meds: . enoxaparin (LOVENOX) injection   30 mg Subcutaneous Q24H  . famotidine  20 mg Oral Daily  . folic acid  1 mg Oral Daily  . mometasone-formoterol  2 puff Inhalation BID  . multivitamin with minerals  1 tablet Oral Daily  . sodium chloride  3 mL Intravenous Q12H  . thiamine  100 mg Oral Daily   Or  . thiamine  100 mg Intravenous Daily  . tiotropium  18 mcg Inhalation Daily   Continuous Infusions: . sodium chloride 50 mL/hr at 08/14/14 2054   PRN Meds:.ibuprofen, LORazepam **OR** LORazepam  Physical Exam: GEN: NAD, AAOx3, thin  HEENT: MMM, EOMI, PERRLA, b/l sclera anicteric, no conjunctival injection, no lymphadenopathy, arcus senilis, angioedema of the inferior and superior lips almost completely resolved. No tongue/throat swelling. No difficulty swallowing.  NECK: Supple, no JVD, no carotid bruits, no thyromegaly  CV: RRR, S1S2nl, no murmurs/rubs/gallops PULM: clear to auscultation b/l, no rales/rhonchi/wheezes, nl percussion. Bilateral mastectomy scars.  ABD: normal/active bowel sounds, soft, ND/NT, no rebound, no guarding, no CVA tenderness, no hepatosplenomegaly EXT: thin, no cyanosis, clubbing or edema  NEURO: no focal deficits  PSYCH: nl affect, nl speech MSK: nl ROM, no joint swelling or erythema  Skin: warm, appears dry. Intact. No rashes or erythema. No signs of trauma.    Labs: Recent Labs     08/12/14  1900  08/12/14  1908   08/14/14  0515   08/14/14  1908  08/15/14  0300  HGB  12.9  12.9   --   11.4*   --    --    --   HCT  34.3*  38.0   --   31.5*   --    --    --   PLT  383   --    --   198   --    --    --   NA  114*  114*   < >  127*  127*   < >  125*  128*  K  3.0*  3.2*   < >  4.5  4.5   < >  3.5  4.6  CL  75*  77*   < >  97  96   < >  89*  97  CO2  24   --    < >  25  25   < >  26  27  BUN  <5*  3*   < >  <5*  5*   < >  <5*  <5*  CREATININE  0.53  0.60   < >  0.60  0.62   < >  0.71  0.53  CALCIUM  8.4   --    < >  8.4  8.5   < >  8.1*  8.2*   < > = values in this  interval not displayed.    CBC Latest Ref Rng 08/14/2014 08/12/2014 08/12/2014  WBC 4.0 - 10.5 K/uL 6.4 - 4.2  Hemoglobin 12.0 - 15.0 g/dL 11.4(L) 12.9 12.9  Hematocrit 36.0 - 46.0 % 31.5(L) 38.0 34.3(L)  Platelets 150 - 400 K/uL 198 - 383    BMP Latest Ref Rng 08/15/2014 08/14/2014 08/14/2014  Glucose 70 - 99 mg/dL 79 94 93  BUN 6 - 23 mg/dL <5(L) <5(L) <5(L)  Creatinine 0.50 - 1.10 mg/dL 0.53 0.71 0.66  Sodium 135 - 145 mmol/L 128(L) 125(L) 126(L)  Potassium 3.5 - 5.1 mmol/L 4.6 3.5 3.6  Chloride 96 - 112 mmol/L 97 89(L) 93(L)  CO2 19 - 32 mmol/L _0 Calcium 8.4 - 10.5 mg/dL 8.2(L) 8.1(L) 8.5    CMP Latest Ref Rng 08/15/2014 08/14/2014 08/14/2014  Glucose 70 - 99 mg/dL 79 94 93  BUN 6 - 23 mg/dL <5(L) <5(L) <5(L)  Creatinine 0.50 - 1.10 mg/dL 0.53 0.71 0.66  Sodium 135 - 145 mmol/L 128(L) 125(L) 126(L)  Potassium 3.5 - 5.1 mmol/L 4.6 3.5 3.6  Chloride 96 - 112 mmol/L 97 89(L) 93(L)  CO2 19 - 32 mmol/L _1 Calcium 8.4 - 10.5 mg/dL 8.2(L) 8.1(L) 8.5  Total Protein 6.0 - 8.3 g/dL - - -  Total Bilirubin 0.3 - 1.2 mg/dL - - -  Alkaline Phos 39 - 117 U/L - - -  AST 0 - 37 U/L - - -  ALT 0 - 35 U/L - - -   Hepatic Function Latest Ref Rng 08/13/2014 05/09/2013 05/08/2013  Total Protein 6.0 - 8.3 g/dL 7.9 6.6 8.1  Albumin 3.5 - 5.2 g/dL 2.8(L) 1.9(L) 2.4(L)  AST 0 - 37 U/L 54(H) 38(H) 52(H)  ALT 0 - 35 U/L 23 34 45(H)  Alk Phosphatase 39 - 117 U/L 264(H) 277(H) 317(H)  Total Bilirubin 0.3 - 1.2 mg/dL 1.2 0.6 0.8  Bilirubin, Direct 0.0 - 0.5 mg/dL 0.4 - -   Lab Results  Component Value Date   TSH 0.605 08/13/2014   Urine Osmolality: 108L (low)   Random Urine Sodium: <10 mmol/L   Arginine Vasopressin hormone level pending.  Images: No new images.   Medications, Vitals, Labs, and Images reviewed.   ASSESSMENT AND PLAN: Patient is prepared for discharge and will follow up with outpatient clinic on Monday for BMET recheck. Patient was counseled on cutting  back/quitting drinking beer and was also counseled on proper dietary choices to insure that her sodium does not fall again, particularly if she continues to drink. ACE inhibitors have been added to her allergy list and the patient is aware of this and the potential danger of taking them again in the future. Continue all home medications except for lisinopril-HCTZ.

## 2014-08-15 NOTE — Progress Notes (Signed)
Patient ID: Kayla Tyler, female   DOB: [birth year], 69 y.o.   MRN: [id number] Medicine attending discharge note: I personally interviewed and examined this patient on the day of discharge together with resident physician Dr. Osa Craver and medical student Miss Tora Duck and I attest to the accuracy of their evaluation and management plan as recorded in their progress notes today.  Clinical summary: 69 year old woman with hypertension recently started on lisinopril hydrochlorothiazide. She had only taken a few doses. She  presented with sudden onset of marked swelling of her lips. She denied any tongue swelling. No choking sensation or dysphagia. Symptoms began to improve when she was treated with Benadryl, Pepcid, and Solu-Medrol in the emergency department. On initial exam pertinent findings limited to swollen lips. There was no stridor. No tongue or throat edema. Oxygen saturation 100% on room air. With discontinuation of the ace inhibitor, her symptoms completely resolved over the next 24-72 hours.  Additional problems identified at time of this admission included severe hyponatremia with sodium as low as 109, hyperkalemia with potassium 6.4. Normal renal function with BUN 5, creatinine 0.7. Liver function abnormalities with alkaline phosphatase 264, SGOT 54, SGPT 23, total bilirubin 1.2.  Hospital course: The patient was clinically euvolemic. She is a smoker. Chest radiograph did not show any mass lesions. Urine sodium was low. Patient admitted to drinking large volumes of fluid daily particularly approximately 46 ounces of beer daily. 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 She was given gentle saline infusions and sodium slowly rose to acceptable values and was 128 at discharge March 30. At no time did she have any mental status changes due to the hyponatremia or the saline replacement. She was counseled on moderate fluid and beer restriction.  She has a chronically elevated  alkaline phosphatase, decreased serum albumin, and mild transaminase elevations. She needs follow-up with liver imaging and a hepatitis profile. HIV screen negative December 2014.  She is discharged in stable condition There were no complications She will follow-up in our internal medicine clinic

## 2014-08-21 ENCOUNTER — Ambulatory Visit (INDEPENDENT_AMBULATORY_CARE_PROVIDER_SITE_OTHER): Payer: Medicare Other | Admitting: Internal Medicine

## 2014-08-21 ENCOUNTER — Encounter: Payer: Self-pay | Admitting: Internal Medicine

## 2014-08-21 VITALS — BP 109/71 | HR 89 | Temp 98.4°F | Ht 64.0 in | Wt 92.9 lb

## 2014-08-21 DIAGNOSIS — R748 Abnormal levels of other serum enzymes: Secondary | ICD-10-CM | POA: Diagnosis not present

## 2014-08-21 DIAGNOSIS — R7989 Other specified abnormal findings of blood chemistry: Secondary | ICD-10-CM | POA: Diagnosis not present

## 2014-08-21 DIAGNOSIS — I10 Essential (primary) hypertension: Secondary | ICD-10-CM

## 2014-08-21 DIAGNOSIS — E871 Hypo-osmolality and hyponatremia: Secondary | ICD-10-CM

## 2014-08-21 DIAGNOSIS — R945 Abnormal results of liver function studies: Secondary | ICD-10-CM

## 2014-08-21 DIAGNOSIS — Z720 Tobacco use: Secondary | ICD-10-CM

## 2014-08-21 DIAGNOSIS — F1010 Alcohol abuse, uncomplicated: Secondary | ICD-10-CM

## 2014-08-21 DIAGNOSIS — T783XXD Angioneurotic edema, subsequent encounter: Secondary | ICD-10-CM

## 2014-08-21 DIAGNOSIS — F17200 Nicotine dependence, unspecified, uncomplicated: Secondary | ICD-10-CM

## 2014-08-21 LAB — COMPLETE METABOLIC PANEL WITH GFR
ALT: 16 U/L (ref 0–35)
AST: 32 U/L (ref 0–37)
Albumin: 3.5 g/dL (ref 3.5–5.2)
Alkaline Phosphatase: 284 U/L — ABNORMAL HIGH (ref 39–117)
BILIRUBIN TOTAL: 0.6 mg/dL (ref 0.2–1.2)
BUN: 6 mg/dL (ref 6–23)
CO2: 23 meq/L (ref 19–32)
CREATININE: 0.62 mg/dL (ref 0.50–1.10)
Calcium: 8.8 mg/dL (ref 8.4–10.5)
Chloride: 90 mEq/L — ABNORMAL LOW (ref 96–112)
GFR, Est African American: >89 mL/min
GLUCOSE: 76 mg/dL (ref 70–99)
Potassium: 4.7 mEq/L (ref 3.5–5.3)
SODIUM: 122 meq/L — AB (ref 135–145)
Total Protein: 7.6 g/dL (ref 6.0–8.3)

## 2014-08-21 MED ORDER — HYDROCHLOROTHIAZIDE 25 MG PO TABS: 25.0000 mg | ORAL_TABLET | Freq: Every day | ORAL | Status: DC

## 2014-08-21 MED ORDER — HYDROCHLOROTHIAZIDE 12.5 MG PO TABS: 12.5000 mg | ORAL_TABLET | Freq: Every day | ORAL | Status: DC

## 2014-08-21 NOTE — Assessment & Plan Note (Signed)
ALP, AST slightly elevated during admission.  Possibly due to excessive EtOH.  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   - CMP today - RUQ [place] to evaluate liver - patient advised to decrease EtOH use and she is agreeable to hearing from [place] regarding resources for cessation.

## 2014-08-21 NOTE — Assessment & Plan Note (Addendum)
BP Readings from Last 3 Encounters:  08/21/14 109/71  08/15/14 128/67  08/03/14 157/101    Lab Results  Component Value Date   NA 128* 08/15/2014   K 4.6 08/15/2014   CREATININE 0.53 08/15/2014    Assessment: Blood pressure control:  well controlled Progress toward BP goal:   at goal Comments: Patient is taking 25mg  HCTZ even though this was not re-prescribed at discharge.  She ensures me she is not taking lisinopril-HCTZ.  She continued taking the old HCTZ rx from Dr. Algis Liming that was prescribed in 06/2014.  Plan: Medications:  continue current medications:  Will decrease HCTZ to 12.5mg  daily since BP soft at 109/71 and to avoid worsening hyponatremia (though I suspect this is mostly due to excessive beer). Other plans:  CMP today. Patient to return to clinic in 1 month for follow-up with PCP.

## 2014-08-21 NOTE — Assessment & Plan Note (Addendum)
Patient reports drinking three 24 ounce beers on a heavy drinking day.  Least amount is two 24 ounces per day.  She says she has been drinking this amount daily since age 69.  She denies hospitalization for EtOH w/d or any interference with her work (prior to retirement) or functioning.  She denies need for an eye opener, says she starts drinking around [place] everyday.   She does not feel there is a problem with her drinking.  She is not aware of any liver disease but understands her LFTs were abnormal during admission. She was advised to cut back.  She is agreeable to CSW contact regarding smoking cessation.   - CMP  - abd [place] - CSW referral

## 2014-08-21 NOTE — Assessment & Plan Note (Addendum)
She feels better.  Symptoms resolved.  No swelling, itching of tongue or face.  No dyspnea.

## 2014-08-21 NOTE — Assessment & Plan Note (Addendum)
Thought to be due to beer potomania or "tea and toast."  CMP today.  Advised to decrease EtOH use.  She says she is eating well at home.

## 2014-08-21 NOTE — Assessment & Plan Note (Signed)
  Assessment: Progress toward smoking cessation:   not interested in quitting Barriers to progress toward smoking cessation:    Comments: smoking 1 PPD for ~ 50 years.  Plan: Instruction/counseling given:  I counseled patient on the dangers of tobacco use, advised patient to stop smoking, and reviewed strategies to maximize success. Educational resources provided:    Self management tools provided:    Medications to assist with smoking cessation:  None Patient agreed to the following self-care plans for smoking cessation: cut down the number of cigarettes smoked  Other plans: continue to address at future visits

## 2014-08-21 NOTE — Progress Notes (Signed)
   Subjective:    Patient ID: Kayla Tyler, female    DOB: 18-Feb-1946, 69 y.o.   MRN: [id number]  HPI Comments: Kayla Tyler is a 69 year old woman with a PMH as below here for hospital follow-up after recent admission for ACEI-induced angioedema.  Please see problem based assessment and plan for details.   Past Medical Hx: COPD HTN Hx of Breast Cancer Tobacco use disorder Excessive EtOH use  Review of Systems  Constitutional: Negative for fever, chills and appetite change.  Respiratory: Negative for shortness of breath.   Cardiovascular: Negative for chest pain and palpitations.  Gastrointestinal: Negative for nausea, vomiting, abdominal pain, diarrhea, constipation and blood in stool.  Genitourinary: Negative for dysuria and hematuria.  Neurological: Negative for syncope and light-headedness.       Filed Vitals:   08/21/14 1432  BP: 109/71  Pulse: 89  Temp: 98.4 F (36.9 C)  TempSrc: Oral  Weight: 92 lb 14.4 oz (42.139 kg)  SpO2: 97%   Objective:   Physical Exam  Constitutional: She is oriented to person, place, and time. No distress.  Thin build.  HENT:  Head: Normocephalic and atraumatic.  Mouth/Throat: Oropharynx is clear and moist. No oropharyngeal exudate.  Eyes: EOM are normal. Pupils are equal, round, and reactive to light.  Neck: Neck supple.  Cardiovascular: Normal rate, regular rhythm and normal heart sounds.  Exam reveals no gallop and no friction rub.   No murmur heard. Pulmonary/Chest: Effort normal and breath sounds normal. No respiratory distress. She has no wheezes. She has no rales.  Abdominal: Soft. Bowel sounds are normal. She exhibits no distension and no mass. There is no tenderness. There is no rebound and no guarding.  No HSM.  Musculoskeletal: Normal range of motion. She exhibits no edema or tenderness.  Neurological: She is alert and oriented to person, place, and time. No cranial nerve deficit.  No asterixis or tremor.  Skin: Skin is warm.  She is not diaphoretic.  Psychiatric: She has a normal mood and affect. Her behavior is normal. Judgment and thought content normal.  Vitals reviewed.         Assessment & Plan:  Please see problem based assessment and plan.

## 2014-08-21 NOTE — Patient Instructions (Signed)
1. I have decreased you blood pressure pill (hydrochlorothiazide) to 12.5mg  daily.  Please pick up the new prescription at your pharmacy.  I will call you if there problems with your labs.  I will have social worker contact you about resources to quit drinking alcohol.  We will arrange for you to have an ultrasound of your abdomen to check your liver.   2. Please take all medications as prescribed.    3. If you have worsening of your symptoms or new symptoms arise, please call the clinic ([phone]), or go to the ER immediately if symptoms are severe.  Please come back to see Dr. Algis Liming in 1 month.

## 2014-08-22 ENCOUNTER — Other Ambulatory Visit: Payer: Self-pay | Admitting: Internal Medicine

## 2014-08-22 DIAGNOSIS — E871 Hypo-osmolality and hyponatremia: Secondary | ICD-10-CM

## 2014-08-23 ENCOUNTER — Other Ambulatory Visit: Payer: Self-pay | Admitting: Internal Medicine

## 2014-08-23 DIAGNOSIS — R748 Abnormal levels of other serum enzymes: Secondary | ICD-10-CM

## 2014-08-23 NOTE — Progress Notes (Signed)
Internal Medicine Clinic Attending  Case discussed with Dr. Wilson soon after the resident saw the patient.  We reviewed the resident's history and exam and pertinent patient test results.  I agree with the assessment, diagnosis, and plan of care documented in the resident's note.  

## 2014-08-24 ENCOUNTER — Telehealth: Payer: Self-pay | Admitting: Licensed Clinical Social Worker

## 2014-08-24 NOTE — Telephone Encounter (Signed)
CSW placed call to Ms. Mccroskey.  Pt currently sleeping at this time, message left with family.

## 2014-08-27 NOTE — Telephone Encounter (Signed)
Family states pt is still sleeping, message left.

## 2014-08-29 NOTE — Telephone Encounter (Signed)
"  I just drink a couple of beers."  "The doctor said I should cut back."  CSW was able to speak with Ms. Hajduk regarding ETOH abuse and resources available.  Pt denies need for individual counseling or structured program.  Ms. Leventhal is in agreement for CSW to mail information regarding support groups.

## 2014-09-01 DIAGNOSIS — J449 Chronic obstructive pulmonary disease, unspecified: Secondary | ICD-10-CM | POA: Diagnosis not present

## 2014-09-03 ENCOUNTER — Encounter: Payer: Medicare Other | Admitting: Internal Medicine

## 2014-09-03 ENCOUNTER — Ambulatory Visit (HOSPITAL_COMMUNITY): Payer: Medicare Other

## 2014-09-04 LAB — ARGININE VASOPRESSIN HORMONE: Arginine Vasopressin: <1 pg/mL — ABNORMAL LOW

## 2014-09-10 ENCOUNTER — Other Ambulatory Visit: Payer: Self-pay | Admitting: Internal Medicine

## 2014-09-20 ENCOUNTER — Ambulatory Visit: Payer: Medicare Other | Admitting: Internal Medicine

## 2014-09-21 ENCOUNTER — Ambulatory Visit: Payer: Medicare Other | Admitting: Internal Medicine

## 2014-09-26 ENCOUNTER — Encounter: Payer: Self-pay | Admitting: *Deleted

## 2014-10-01 DIAGNOSIS — J449 Chronic obstructive pulmonary disease, unspecified: Secondary | ICD-10-CM | POA: Diagnosis not present

## 2014-10-12 ENCOUNTER — Encounter: Payer: Self-pay | Admitting: Internal Medicine

## 2014-10-12 ENCOUNTER — Encounter: Payer: Medicare Other | Admitting: Internal Medicine

## 2014-10-25 NOTE — Addendum Note (Signed)
Addended by: Orson Gear on: 10/25/2014 11:54 AM   Modules accepted: Orders

## 2014-11-01 DIAGNOSIS — J449 Chronic obstructive pulmonary disease, unspecified: Secondary | ICD-10-CM | POA: Diagnosis not present

## 2014-12-01 DIAGNOSIS — J449 Chronic obstructive pulmonary disease, unspecified: Secondary | ICD-10-CM | POA: Diagnosis not present

## 2015-01-01 DIAGNOSIS — J449 Chronic obstructive pulmonary disease, unspecified: Secondary | ICD-10-CM | POA: Diagnosis not present

## 2015-01-15 ENCOUNTER — Encounter: Payer: Medicare Other | Admitting: Internal Medicine

## 2015-01-29 ENCOUNTER — Encounter: Payer: Medicare Other | Admitting: Internal Medicine

## 2015-02-01 DIAGNOSIS — J449 Chronic obstructive pulmonary disease, unspecified: Secondary | ICD-10-CM | POA: Diagnosis not present

## 2015-02-05 ENCOUNTER — Ambulatory Visit (INDEPENDENT_AMBULATORY_CARE_PROVIDER_SITE_OTHER): Payer: Medicare Other | Admitting: Internal Medicine

## 2015-02-05 ENCOUNTER — Encounter: Payer: Self-pay | Admitting: Internal Medicine

## 2015-02-05 VITALS — BP 151/90 | HR 108 | Temp 97.9°F | Wt 87.4 lb

## 2015-02-05 DIAGNOSIS — Z0000 Encounter for general adult medical examination without abnormal findings: Secondary | ICD-10-CM

## 2015-02-05 DIAGNOSIS — J438 Other emphysema: Secondary | ICD-10-CM

## 2015-02-05 DIAGNOSIS — J449 Chronic obstructive pulmonary disease, unspecified: Secondary | ICD-10-CM | POA: Diagnosis not present

## 2015-02-05 DIAGNOSIS — R945 Abnormal results of liver function studies: Secondary | ICD-10-CM

## 2015-02-05 DIAGNOSIS — E785 Hyperlipidemia, unspecified: Secondary | ICD-10-CM | POA: Diagnosis not present

## 2015-02-05 DIAGNOSIS — I10 Essential (primary) hypertension: Secondary | ICD-10-CM | POA: Diagnosis not present

## 2015-02-05 DIAGNOSIS — F17200 Nicotine dependence, unspecified, uncomplicated: Secondary | ICD-10-CM

## 2015-02-05 DIAGNOSIS — F1020 Alcohol dependence, uncomplicated: Secondary | ICD-10-CM

## 2015-02-05 DIAGNOSIS — R7989 Other specified abnormal findings of blood chemistry: Secondary | ICD-10-CM

## 2015-02-05 DIAGNOSIS — Z23 Encounter for immunization: Secondary | ICD-10-CM | POA: Diagnosis not present

## 2015-02-05 DIAGNOSIS — F17211 Nicotine dependence, cigarettes, in remission: Secondary | ICD-10-CM

## 2015-02-05 DIAGNOSIS — F1010 Alcohol abuse, uncomplicated: Secondary | ICD-10-CM | POA: Diagnosis not present

## 2015-02-05 DIAGNOSIS — R748 Abnormal levels of other serum enzymes: Secondary | ICD-10-CM

## 2015-02-05 DIAGNOSIS — Z780 Asymptomatic menopausal state: Secondary | ICD-10-CM

## 2015-02-05 DIAGNOSIS — E871 Hypo-osmolality and hyponatremia: Secondary | ICD-10-CM | POA: Diagnosis not present

## 2015-02-05 MED ORDER — LEVALBUTEROL TARTRATE 45 MCG/ACT IN AERO: 2.0000 | INHALATION_SPRAY | RESPIRATORY_TRACT | Status: AC | PRN

## 2015-02-05 MED ORDER — THIAMINE HCL 100 MG PO TABS: 100.0000 mg | ORAL_TABLET | Freq: Every day | ORAL | Status: AC

## 2015-02-05 MED ORDER — TIOTROPIUM BROMIDE MONOHYDRATE 18 MCG IN CAPS: 18.0000 ug | ORAL_CAPSULE | Freq: Every day | RESPIRATORY_TRACT | Status: DC

## 2015-02-05 MED ORDER — ENSURE COMPLETE SHAKE PO LIQD: 1.0000 | Freq: Two times a day (BID) | ORAL | Status: DC

## 2015-02-05 MED ORDER — MOMETASONE FURO-FORMOTEROL FUM 200-5 MCG/ACT IN AERO: 2.0000 | INHALATION_SPRAY | Freq: Two times a day (BID) | RESPIRATORY_TRACT | Status: DC

## 2015-02-05 MED ORDER — FOLIC ACID 1 MG PO TABS: 1.0000 mg | ORAL_TABLET | Freq: Every day | ORAL | Status: AC

## 2015-02-05 NOTE — Assessment & Plan Note (Signed)
LFTs mildly elevated in 2:1 ratio during March 2016 hospitalization for angioedema. Repeat CMP in April 2016 showed normal AST and ALT levels. Her alk phos remains elevated and has been as high as 379 back in 2014. Most recent alk phos 284. Likely related to her alcohol use. She was supposed to get an abdominal US after her April hospital follow up visit but this was never performed.  - Will check repeat CMP today and GGT - Hold off on Abd [place] given LFTs normalized in April. Will re-evaluate CMP today and determine based on those levels.

## 2015-02-05 NOTE — Assessment & Plan Note (Signed)
Patient previously on home oxygen, but now no longer requiring oxygen for several months. She is on Spiriva, Dulera, and Xopenex inhalers. She notes SOB when exerting herself and a cough that are chronic. She is still smoking 1 pack per day. She has no desire to quit smoking.  - Refilled inhalers - Encouraged to quit smoking or at least cut down on the number of cigarettes

## 2015-02-05 NOTE — Patient Instructions (Signed)
-   Refilled inhalers - Start drinking ensure shakes twice daily between meals - Will have dexa scan scheduled to check bone density - Blood work today - Try to cut down to 1/2 pack cigarettes per day - Decrease drinking gradually  General Instructions:   Please bring your medicines with you each time you come to clinic.  Medicines may include prescription medications, over-the-counter medications, herbal remedies, eye drops, vitamins, or other pills.   Progress Toward Treatment Goals:  Treatment Goal 06/22/2014  Stop smoking stopped smoking    Self Care Goals & Plans:  Self Care Goal 08/21/2014  Manage my medications bring my medications to every visit; take my medicines as prescribed; refill my medications on time  Eat healthy foods eat more vegetables; eat baked foods instead of fried foods; eat foods that are low in salt  Be physically active find an activity I enjoy  Stop smoking cut down the number of cigarettes smoked    No flowsheet data found.   Care Management & Community Referrals:  No flowsheet data found.

## 2015-02-05 NOTE — Assessment & Plan Note (Signed)
Flu shot today 

## 2015-02-05 NOTE — Assessment & Plan Note (Signed)
  Assessment: Progress toward smoking cessation:  smoking the same amount Barriers to progress toward smoking cessation:  withdrawal symptoms, lack of motivation to quit, alcohol use Comments: Patient still smoking 1 ppd. She has no desire to quit, stating "I've lived this long doing it."   Plan: Instruction/counseling given:  I counseled patient on the dangers of tobacco use, advised patient to stop smoking, and reviewed strategies to maximize success. Medications to assist with smoking cessation:  None Patient agreed to the following self-care plans for smoking cessation: cut down the number of cigarettes smoked  Other plans:  - She has agreed to try to cut down to 1/2 ppd - Educated patient on risks associated with smoking and encouraged her to quit

## 2015-02-05 NOTE — Assessment & Plan Note (Signed)
Patient reports drinking a 6 pack of beer daily. She has been doing this since her 31's. She states she would like to quit drinking alcohol but she suffers from withdrawal symptoms including "shakes" and not feeling well in general when she has tried to quit in the past. 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 Her LFTs are within normal limits per CMP in April 2016, except for alk phos which was elevated at 284.  - Advised patient to gradually cut down on her alcohol intake - Educated patient on risks of excessive alcohol use - Repeat LFTs, GGT

## 2015-02-05 NOTE — Progress Notes (Signed)
Internal Medicine Clinic Attending  Case discussed with Dr. Rivet at the time of the visit.  We reviewed the resident's history and exam and pertinent patient test results.  I agree with the assessment, diagnosis, and plan of care documented in the resident's note.  

## 2015-02-05 NOTE — Assessment & Plan Note (Addendum)
Last lipid panel in 2010 showed an elevated cholesterol 200, HDL 71, and LDL 120. She has never been on a statin. Given her age and how well she has done without being on a statin she does not need to start one as will likely cause more harm than benefit.

## 2015-02-05 NOTE — Assessment & Plan Note (Signed)
Patient at high risk for osteoporosis given her smoking and alcohol use. She is also malnourished with BMI 15. She has never had a DEXA scan.  - DEXA ordered - Check vitamin D level - Start calcium and vitamin D supplementation. Will start ergocalciferol 50,000 units Qweekly if Vit D level low.  - Prescribed Ensure twice daily between meals

## 2015-02-05 NOTE — Progress Notes (Signed)
   Subjective:    Patient ID: Kayla Tyler, female    DOB: 02/05/1946, 69 y.o.   MRN: [id number]  HPI Ms. Masser is a 69yo woman with PMHx of HTN, COPD, hyperlipidemia, alcohol abuse, and tobacco abuse who presents today for a routine visit. Please see the A&P for the status of the patient's chronic medical problems.   Review of Systems General: Denies fever, chills, night sweats, changes in weight, changes in appetite HEENT: Denies headaches, ear pain, changes in vision, rhinorrhea, sore throat CV: Denies CP, palpitations, SOB, orthopnea Pulm: Reports SOB and cough- chronic. Denies wheezing GI: Denies abdominal pain, nausea, vomiting, diarrhea, constipation, melena, hematochezia GU: Denies dysuria, hematuria, frequency Msk: Denies muscle cramps, joint pains Neuro: Denies weakness, numbness, tingling Skin: Denies rashes, bruising Psych: Denies depression, anxiety, hallucinations    Objective:   Physical Exam General: thin woman sitting up in chair, NAD, pleasant  HEENT: [place]/AT, EOMI, sclera anicteric, mucus membranes moist CV: RRR, no m/g/r Pulm: CTA bilaterally, no wheezing Abd: BS+, soft, non-tender Ext: warm, no peripheral edema Neuro: alert and oriented x 3    Assessment & Plan:  Refer to A&P documentation.

## 2015-02-05 NOTE — Assessment & Plan Note (Signed)
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 Last Na 122 per April 2016 CMP.  - Recheck CMP today

## 2015-02-05 NOTE — Assessment & Plan Note (Signed)
BP Readings from Last 3 Encounters:  02/05/15 151/90  08/21/14 109/71  08/15/14 128/67    Lab Results  Component Value Date   NA 122* 08/21/2014   K 4.7 08/21/2014   CREATININE 0.62 08/21/2014    Assessment: Blood pressure control: mildly elevated Progress toward BP goal:  deteriorated Comments: BP mildly elevated today with elevated HR. Likely elevated from increased sympathetic activation. BPs in past have been in good range, in 118A-677J systolic. Patient reports she has been off HCTZ for 2 years now, currently not on any BP medications.   Plan: Medications:  No meds currently.  Other plans:  - Recommended low salt diet - Can consider restarting HCTZ if BP continues to be elevated at future visits

## 2015-02-06 LAB — VITAMIN D 25 HYDROXY (VIT D DEFICIENCY, FRACTURES): Vit D, 25-Hydroxy: <4 ng/mL — ABNORMAL LOW (ref 30.0–100.0)

## 2015-02-06 LAB — CMP14 + ANION GAP
A/G RATIO: 0.9 — AB (ref 1.1–2.5)
ALBUMIN: 3.6 g/dL (ref 3.5–4.8)
ALT: 22 IU/L (ref 0–32)
ANION GAP: 18 mmol/L (ref 10.0–18.0)
AST: 57 IU/L — ABNORMAL HIGH (ref 0–40)
Alkaline Phosphatase: 366 IU/L — ABNORMAL HIGH (ref 39–117)
BUN / CREAT RATIO: 7 — AB (ref 11–26)
BUN: 4 mg/dL — ABNORMAL LOW (ref 8–27)
Bilirubin Total: 0.6 mg/dL (ref 0.0–1.2)
CALCIUM: 9.3 mg/dL (ref 8.7–10.3)
CO2: 23 mmol/L (ref 18–29)
Chloride: 91 mmol/L — ABNORMAL LOW (ref 97–108)
Creatinine, Ser: 0.57 mg/dL (ref 0.57–1.00)
GFR calc Af Amer: 109 mL/min/{1.73_m2} (ref >59)
GFR, EST NON AFRICAN AMERICAN: 94 mL/min/{1.73_m2} (ref >59)
GLOBULIN, TOTAL: 4.2 g/dL (ref 1.5–4.5)
Glucose: 83 mg/dL (ref 65–99)
Potassium: 4.6 mmol/L (ref 3.5–5.2)
SODIUM: 132 mmol/L — AB (ref 134–144)
Total Protein: 7.8 g/dL (ref 6.0–8.5)

## 2015-02-06 LAB — GAMMA GT: GGT: 552 IU/L — AB (ref 0–60)

## 2015-03-03 DIAGNOSIS — J449 Chronic obstructive pulmonary disease, unspecified: Secondary | ICD-10-CM | POA: Diagnosis not present

## 2015-03-14 DIAGNOSIS — K5730 Diverticulosis of large intestine without perforation or abscess without bleeding: Secondary | ICD-10-CM | POA: Diagnosis not present

## 2015-03-14 DIAGNOSIS — Z86010 Personal history of colonic polyps: Secondary | ICD-10-CM | POA: Diagnosis not present

## 2015-03-14 DIAGNOSIS — Z09 Encounter for follow-up examination after completed treatment for conditions other than malignant neoplasm: Secondary | ICD-10-CM | POA: Diagnosis not present

## 2015-03-14 DIAGNOSIS — K5520 Angiodysplasia of colon without hemorrhage: Secondary | ICD-10-CM | POA: Diagnosis not present

## 2015-04-03 DIAGNOSIS — J449 Chronic obstructive pulmonary disease, unspecified: Secondary | ICD-10-CM | POA: Diagnosis not present

## 2015-04-09 ENCOUNTER — Encounter: Payer: Self-pay | Admitting: Student

## 2015-07-04 DIAGNOSIS — J449 Chronic obstructive pulmonary disease, unspecified: Secondary | ICD-10-CM | POA: Diagnosis not present

## 2015-08-01 DIAGNOSIS — J449 Chronic obstructive pulmonary disease, unspecified: Secondary | ICD-10-CM | POA: Diagnosis not present

## 2015-09-01 DIAGNOSIS — J449 Chronic obstructive pulmonary disease, unspecified: Secondary | ICD-10-CM | POA: Diagnosis not present

## 2015-10-01 DIAGNOSIS — J449 Chronic obstructive pulmonary disease, unspecified: Secondary | ICD-10-CM | POA: Diagnosis not present

## 2015-11-01 DIAGNOSIS — J449 Chronic obstructive pulmonary disease, unspecified: Secondary | ICD-10-CM | POA: Diagnosis not present

## 2015-12-01 DIAGNOSIS — J449 Chronic obstructive pulmonary disease, unspecified: Secondary | ICD-10-CM | POA: Diagnosis not present

## 2016-01-01 DIAGNOSIS — J449 Chronic obstructive pulmonary disease, unspecified: Secondary | ICD-10-CM | POA: Diagnosis not present

## 2016-02-01 DIAGNOSIS — J449 Chronic obstructive pulmonary disease, unspecified: Secondary | ICD-10-CM | POA: Diagnosis not present

## 2016-03-02 DIAGNOSIS — J449 Chronic obstructive pulmonary disease, unspecified: Secondary | ICD-10-CM | POA: Diagnosis not present

## 2016-03-25 ENCOUNTER — Encounter (HOSPITAL_COMMUNITY): Payer: Self-pay | Admitting: Emergency Medicine

## 2016-03-25 ENCOUNTER — Emergency Department (HOSPITAL_COMMUNITY): Payer: Medicare Other

## 2016-03-25 ENCOUNTER — Observation Stay (HOSPITAL_COMMUNITY)
Admission: EM | Admit: 2016-03-25 | Discharge: 2016-03-26 | Disposition: A | Discharge status: Home / Self Care | Payer: Medicare Other | Attending: Internal Medicine | Admitting: Internal Medicine

## 2016-03-25 DIAGNOSIS — Z23 Encounter for immunization: Secondary | ICD-10-CM | POA: Diagnosis not present

## 2016-03-25 DIAGNOSIS — R64 Cachexia: Secondary | ICD-10-CM | POA: Diagnosis not present

## 2016-03-25 DIAGNOSIS — R945 Abnormal results of liver function studies: Secondary | ICD-10-CM

## 2016-03-25 DIAGNOSIS — R05 Cough: Secondary | ICD-10-CM | POA: Diagnosis not present

## 2016-03-25 DIAGNOSIS — IMO0002 Reserved for concepts with insufficient information to code with codable children: Secondary | ICD-10-CM | POA: Diagnosis present

## 2016-03-25 DIAGNOSIS — R0602 Shortness of breath: Secondary | ICD-10-CM | POA: Diagnosis not present

## 2016-03-25 DIAGNOSIS — R51 Headache: Secondary | ICD-10-CM | POA: Insufficient documentation

## 2016-03-25 DIAGNOSIS — E43 Unspecified severe protein-calorie malnutrition: Secondary | ICD-10-CM | POA: Insufficient documentation

## 2016-03-25 DIAGNOSIS — F17210 Nicotine dependence, cigarettes, uncomplicated: Secondary | ICD-10-CM | POA: Insufficient documentation

## 2016-03-25 DIAGNOSIS — F17200 Nicotine dependence, unspecified, uncomplicated: Secondary | ICD-10-CM | POA: Diagnosis present

## 2016-03-25 DIAGNOSIS — J441 Chronic obstructive pulmonary disease with (acute) exacerbation: Secondary | ICD-10-CM | POA: Diagnosis not present

## 2016-03-25 DIAGNOSIS — F1010 Alcohol abuse, uncomplicated: Secondary | ICD-10-CM | POA: Diagnosis not present

## 2016-03-25 DIAGNOSIS — F1090 Alcohol use, unspecified, uncomplicated: Secondary | ICD-10-CM | POA: Diagnosis present

## 2016-03-25 DIAGNOSIS — R000 Tachycardia, unspecified: Secondary | ICD-10-CM | POA: Diagnosis not present

## 2016-03-25 DIAGNOSIS — E559 Vitamin D deficiency, unspecified: Secondary | ICD-10-CM | POA: Diagnosis not present

## 2016-03-25 DIAGNOSIS — J438 Other emphysema: Secondary | ICD-10-CM

## 2016-03-25 DIAGNOSIS — I700 Atherosclerosis of aorta: Secondary | ICD-10-CM | POA: Diagnosis present

## 2016-03-25 DIAGNOSIS — R7989 Other specified abnormal findings of blood chemistry: Secondary | ICD-10-CM | POA: Diagnosis present

## 2016-03-25 DIAGNOSIS — Z681 Body mass index (BMI) 19 or less, adult: Secondary | ICD-10-CM

## 2016-03-25 DIAGNOSIS — I10 Essential (primary) hypertension: Secondary | ICD-10-CM | POA: Diagnosis present

## 2016-03-25 DIAGNOSIS — Z853 Personal history of malignant neoplasm of breast: Secondary | ICD-10-CM | POA: Insufficient documentation

## 2016-03-25 HISTORY — DX: Chronic obstructive pulmonary disease, unspecified: J44.9

## 2016-03-25 HISTORY — DX: Essential (primary) hypertension: I10

## 2016-03-25 LAB — BRAIN NATRIURETIC PEPTIDE: B Natriuretic Peptide: 85.7 pg/mL (ref 0.0–100.0)

## 2016-03-25 LAB — CBC WITH DIFFERENTIAL/PLATELET
BASOS ABS: 0 10*3/uL (ref 0.0–0.1)
Basophils Relative: 0 %
Eosinophils Absolute: 0 10*3/uL (ref 0.0–0.7)
Eosinophils Relative: 0 %
HEMATOCRIT: 38.6 % (ref 36.0–46.0)
Hemoglobin: 13.4 g/dL (ref 12.0–15.0)
LYMPHS ABS: 1.2 10*3/uL (ref 0.7–4.0)
LYMPHS PCT: 9 %
MCH: 36.2 pg — ABNORMAL HIGH (ref 26.0–34.0)
MCHC: 34.7 g/dL (ref 30.0–36.0)
MCV: 104.3 fL — AB (ref 78.0–100.0)
Monocytes Absolute: 0.8 10*3/uL (ref 0.1–1.0)
Monocytes Relative: 6 %
NEUTROS ABS: 11.9 10*3/uL — AB (ref 1.7–7.7)
Neutrophils Relative %: 85 %
Platelets: 139 10*3/uL — ABNORMAL LOW (ref 150–400)
RBC: 3.7 MIL/uL — AB (ref 3.87–5.11)
RDW: 13.9 % (ref 11.5–15.5)
WBC: 13.9 10*3/uL — AB (ref 4.0–10.5)

## 2016-03-25 LAB — COMPREHENSIVE METABOLIC PANEL
ALBUMIN: 3 g/dL — AB (ref 3.5–5.0)
ALK PHOS: 345 U/L — AB (ref 38–126)
ALT: 42 U/L (ref 14–54)
ANION GAP: 10 (ref 5–15)
AST: 75 U/L — AB (ref 15–41)
BUN: <5 mg/dL — ABNORMAL LOW (ref 6–20)
CHLORIDE: 94 mmol/L — AB (ref 101–111)
CO2: 28 mmol/L (ref 22–32)
Calcium: 8.9 mg/dL (ref 8.9–10.3)
Creatinine, Ser: 0.69 mg/dL (ref 0.44–1.00)
GFR calc Af Amer: >60 mL/min (ref >60)
GFR calc non Af Amer: >60 mL/min (ref >60)
Glucose, Bld: 90 mg/dL (ref 65–99)
POTASSIUM: 4.2 mmol/L (ref 3.5–5.1)
SODIUM: 132 mmol/L — AB (ref 135–145)
TOTAL PROTEIN: 8.6 g/dL — AB (ref 6.5–8.1)
Total Bilirubin: 1.9 mg/dL — ABNORMAL HIGH (ref 0.3–1.2)

## 2016-03-25 LAB — ETHANOL

## 2016-03-25 LAB — I-STAT TROPONIN, ED: Troponin i, poc: 0 ng/mL (ref 0.00–0.08)

## 2016-03-25 LAB — TROPONIN I: Troponin I: 0.03 ng/mL (ref <0.03)

## 2016-03-25 MED ORDER — ONDANSETRON HCL 4 MG/2ML IJ SOLN: 4.0000 mg | Freq: Four times a day (QID) | INTRAMUSCULAR | Status: DC | PRN

## 2016-03-25 MED ORDER — NICOTINE 7 MG/24HR TD PT24
7.0000 mg | MEDICATED_PATCH | Freq: Once | TRANSDERMAL | Status: DC
  Administered 2016-03-25: 7 mg via TRANSDERMAL
  Filled 2016-03-25: qty 1

## 2016-03-25 MED ORDER — METHYLPREDNISOLONE SODIUM SUCC 125 MG IJ SOLR
125.0000 mg | Freq: Once | INTRAMUSCULAR | Status: AC
  Administered 2016-03-25: 125 mg via INTRAVENOUS
  Filled 2016-03-25: qty 2

## 2016-03-25 MED ORDER — ACETAMINOPHEN 650 MG RE SUPP: 650.0000 mg | Freq: Four times a day (QID) | RECTAL | Status: DC | PRN

## 2016-03-25 MED ORDER — ENSURE ENLIVE PO LIQD
1.0000 | Freq: Two times a day (BID) | ORAL | Status: DC
  Administered 2016-03-26: 237 mL via ORAL

## 2016-03-25 MED ORDER — SENNOSIDES-DOCUSATE SODIUM 8.6-50 MG PO TABS
1.0000 | ORAL_TABLET | Freq: Every day | ORAL | Status: DC
  Filled 2016-03-25: qty 1

## 2016-03-25 MED ORDER — IPRATROPIUM-ALBUTEROL 0.5-2.5 (3) MG/3ML IN SOLN
3.0000 mL | Freq: Four times a day (QID) | RESPIRATORY_TRACT | Status: DC
  Administered 2016-03-26 (×2): 3 mL via RESPIRATORY_TRACT
  Filled 2016-03-25 (×3): qty 3

## 2016-03-25 MED ORDER — IPRATROPIUM-ALBUTEROL 0.5-2.5 (3) MG/3ML IN SOLN
3.0000 mL | Freq: Once | RESPIRATORY_TRACT | Status: AC
Start: 2016-03-25 — End: 2016-03-25
  Administered 2016-03-25: 3 mL via RESPIRATORY_TRACT
  Filled 2016-03-25: qty 3

## 2016-03-25 MED ORDER — INFLUENZA VAC SPLIT QUAD 0.5 ML IM SUSY
0.5000 mL | PREFILLED_SYRINGE | INTRAMUSCULAR | Status: AC
  Administered 2016-03-26: 0.5 mL via INTRAMUSCULAR
  Filled 2016-03-25: qty 0.5

## 2016-03-25 MED ORDER — THIAMINE HCL 100 MG/ML IJ SOLN
100.0000 mg | Freq: Every day | INTRAMUSCULAR | Status: DC
  Administered 2016-03-26: 100 mg via INTRAVENOUS
  Filled 2016-03-25: qty 2

## 2016-03-25 MED ORDER — LORAZEPAM 2 MG/ML IJ SOLN: 1.0000 mg | Freq: Four times a day (QID) | INTRAMUSCULAR | Status: DC | PRN

## 2016-03-25 MED ORDER — PREDNISONE 20 MG PO TABS
40.0000 mg | ORAL_TABLET | Freq: Every day | ORAL | Status: DC
  Administered 2016-03-26: 40 mg via ORAL
  Filled 2016-03-25: qty 2

## 2016-03-25 MED ORDER — ONDANSETRON HCL 4 MG PO TABS
4.0000 mg | ORAL_TABLET | Freq: Four times a day (QID) | ORAL | Status: DC | PRN
Start: 2016-03-25 — End: 2016-03-26

## 2016-03-25 MED ORDER — MOMETASONE FURO-FORMOTEROL FUM 200-5 MCG/ACT IN AERO
2.0000 | INHALATION_SPRAY | Freq: Two times a day (BID) | RESPIRATORY_TRACT | Status: DC
  Filled 2016-03-25: qty 8.8

## 2016-03-25 MED ORDER — TIOTROPIUM BROMIDE MONOHYDRATE 18 MCG IN CAPS
18.0000 ug | ORAL_CAPSULE | Freq: Every day | RESPIRATORY_TRACT | Status: DC
  Filled 2016-03-25: qty 5

## 2016-03-25 MED ORDER — ADULT MULTIVITAMIN W/MINERALS CH
1.0000 | ORAL_TABLET | Freq: Every day | ORAL | Status: DC
  Administered 2016-03-26: 1 via ORAL
  Filled 2016-03-25 (×2): qty 1

## 2016-03-25 MED ORDER — SODIUM CHLORIDE 0.9 % IV SOLN
INTRAVENOUS | Status: AC
  Administered 2016-03-25: 23:00:00 via INTRAVENOUS

## 2016-03-25 MED ORDER — AZITHROMYCIN 500 MG PO TABS
500.0000 mg | ORAL_TABLET | Freq: Every day | ORAL | Status: DC
  Administered 2016-03-25 – 2016-03-26 (×2): 500 mg via ORAL
  Filled 2016-03-25 (×2): qty 1

## 2016-03-25 MED ORDER — VITAMIN B-1 100 MG PO TABS
100.0000 mg | ORAL_TABLET | Freq: Every day | ORAL | Status: DC
  Filled 2016-03-25: qty 1

## 2016-03-25 MED ORDER — ENOXAPARIN SODIUM 30 MG/0.3ML ~~LOC~~ SOLN
20.0000 mg | SUBCUTANEOUS | Status: DC
  Administered 2016-03-26: 20 mg via SUBCUTANEOUS
  Filled 2016-03-25: qty 0.3
  Filled 2016-03-25: qty 0.2

## 2016-03-25 MED ORDER — FOLIC ACID 1 MG PO TABS
1.0000 mg | ORAL_TABLET | Freq: Every day | ORAL | Status: DC
  Administered 2016-03-26: 1 mg via ORAL
  Filled 2016-03-25 (×2): qty 1

## 2016-03-25 MED ORDER — LORAZEPAM 1 MG PO TABS: 1.0000 mg | ORAL_TABLET | Freq: Four times a day (QID) | ORAL | Status: DC | PRN

## 2016-03-25 MED ORDER — ACETAMINOPHEN 325 MG PO TABS
650.0000 mg | ORAL_TABLET | Freq: Four times a day (QID) | ORAL | Status: DC | PRN
Start: 2016-03-25 — End: 2016-03-26

## 2016-03-25 NOTE — H&P (Signed)
Date: 03/25/2016               Patient Name:  Kayla Tyler MRN: [id number]  DOB: Jul 03, 1945 Age / Sex: 70 y.o., female   PCP: Juliet Rude, MD              Medical Service: Internal Medicine Teaching Service              Attending Physician: Dr. Davonna Belling, MD    First Contact: Purvis Sheffield, [place] [address] Pager: [phone]  Second Contact: Dr. Asencion Partridge Pager: [phone]  Third Contact Dr. Burgess Estelle Pager: [phone]       After Hours (After 5p/  First Contact Pager: [phone]  weekends / holidays): Second Contact Pager: [phone]   Chief Complaint: Shortness of breath  History of Present Illness: Kayla Tyler is a 70 year old woman with a history of HTN, COPD, HLD, alcohol use disorder, and tobacco use disorder who presents to the emergency department with shortness of breath. She reports her symptoms started yesterday morning when she woke up with increased work of breathing. She spent the day "in front of a ceiling fan" or with her "head out the window" trying to breathe easier. She also reports that yesterday she developed a cough productive of white mucus, although today that cough is dry. She endorses increased fatigue and says that although normally she spends the day around the house, yesterday and today she has spent much of her time "in my room trying to catch my breath." She says that these symptoms are similar to previous COPD exacerbations that resulted in hospitalizations. She denies fever, chills, congestion, sore throat, chest pain, nausea, vomiting, or changes in urine or stool patterns. She has no sick contacts. She has not noticed weight loss or night sweats. She is not taking any medications at home except for Goody's powder and has not been on supplemental oxygen or any inhalers since her clinic appointment last year. She smoked 0.5 PPD and drinks 48 oz of beer daily and 72 oz on weekends. Her last drink was last night.  In the emergency room, Kayla Tyler was afebrile and  hemodynamically stable. She was tachycardic and tachypneic with otherwise normal vitals including oxygen saturations of 96-100% on room air. She received a Duoneb and Solu-medrol 125 mg IV. She is being admitted to the Internal Medicine Teaching Service for evaluation and treatment  Meds: No current facility-administered medications for this encounter.    Current Outpatient Prescriptions  Medication Sig Dispense Refill  . Aspirin-Acetaminophen-Caffeine (GOODY HEADACHE PO) Take 1 packet by mouth daily as needed.    . folic acid (FOLVITE) 1 MG tablet Take 1 tablet (1 mg total) by mouth daily. (Patient not taking: Reported on 03/25/2016) 30 tablet 0  . levalbuterol (XOPENEX HFA) 45 MCG/ACT inhaler Inhale 2 puffs into the lungs every 4 (four) hours as needed for wheezing. (Patient not taking: Reported on 03/25/2016) 1 Inhaler 12  . mometasone-formoterol (DULERA) 200-5 MCG/ACT AERO Inhale 2 puffs into the lungs 2 (two) times daily. (Patient not taking: Reported on 03/25/2016) 1 Inhaler 11  . Nutritional Supplements (ENSURE COMPLETE SHAKE) LIQD Take 1 Can by mouth 2 (two) times daily. (Patient not taking: Reported on 03/25/2016) 30 Bottle 3  . thiamine 100 MG tablet Take 1 tablet (100 mg total) by mouth daily. (Patient not taking: Reported on 03/25/2016) 30 tablet 0  . tiotropium (SPIRIVA HANDIHALER) 18 MCG inhalation capsule Place 1 capsule (18 mcg total) into inhaler and inhale daily. (Patient  not taking: Reported on 03/25/2016) 30 capsule 12    Allergies: Allergies as of 03/25/2016 - Review Complete 03/25/2016  Allergen Reaction Noted  . Lisinopril Swelling 08/12/2014  . Penicillins Other (See Comments) 04/30/2013   Past Medical History:  Diagnosis Date  . Breast cancer ([place])   . COPD (chronic obstructive pulmonary disease) ([place])   . Hypertension    Past Surgical History:  Procedure Laterality Date  . MASTECTOMY Left    Family History  Problem Relation Age of Onset  . Heart attack Father 20    . Stroke Sister 27  . Diabetes Sister   . Hypertension Brother    Social History   Social History  . Marital status: Widowed    Spouse name: N/A  . Number of children: N/A  . Years of education: N/A   Occupational History  . Not on file.   Social History Main Topics  . Smoking status: Current Every Day Smoker    Packs/day: 0.50    Years: 48.00    Types: Cigarettes  . Smokeless tobacco: Never Used     Comment: cutting back 3/4 pack  . Alcohol use 19.2 oz/week    32 Cans of beer per week  . Drug use: No  . Sexual activity: Not on file   Other Topics Concern  . Not on file   Social History Narrative  . No narrative on file    Review of Systems: Endorses fatigue. Denies fever, chills, congestion, throat pain, chest pain, nausea, vomiting, dysuria, diarrhea, constipation, night sweats, weight loss.  Physical Exam: Blood pressure 109/77, pulse 110, temperature 97.5 F (36.4 C), temperature source Oral, resp. rate 22, SpO2 96 %. General: Alert, cooperative, cachectic woman in no acute distress Head: Normocephalic, atraumatic Eyes: :PERRLA, mildly icteric sclera with hyperpigmented lesion lateral to left pupil CV: RRR, no m/r/g heard on exam today Pulm: Breath sounds distant, lungs clear to auscultation bilaterally Abdomen: Non-tender, non-distended, normoactive bowel sounds Extremities: No peripheral edema Skin: Warm, dry; flaking skin bilateral lower extremities and back Psych: Pleasant, normal mood and affect Neuro: CN II-XII grossly intact, grossly normal movement of all extremities  Lab results: Basic Metabolic Panel:  Recent Labs  03/25/16 1403  NA 132*  K 4.2  CL 94*  CO2 28  GLUCOSE 90  BUN <5*  CREATININE 0.69  CALCIUM 8.9   Liver Function Tests:  Recent Labs  03/25/16 1403  AST 75*  ALT 42  ALKPHOS 345*  BILITOT 1.9*  PROT 8.6*  ALBUMIN 3.0*   No results for input(s): LIPASE, AMYLASE in the last 72 hours. No results for input(s):  AMMONIA in the last 72 hours. CBC:  Recent Labs  03/25/16 1403  WBC 13.9*  NEUTROABS 11.9*  HGB 13.4  HCT 38.6  MCV 104.3*  PLT 139*   Cardiac Enzymes:  Recent Labs  03/25/16 1403  TROPONINI 0.03*    Brain natriuretic peptide  Order: [id number]  Status:  Final result Visible to patient:  No (Not Released) Next appt:  None   Ref Range & Units 14:03  B Natriuretic Peptide 0.0 - 100.0 pg/mL 85.7   Resulting Agency  [facility]    Specimen Collected: 03/25/16 14:03 Last Resulted: 03/25/16 15:05         Alcohol Level:  Recent Labs  03/25/16 [place] <5   Imaging results:  Dg Chest 2 View  Result Date: 03/25/2016 CLINICAL DATA:  Shortness of breath and dry cough. EXAM: CHEST  2 VIEW COMPARISON:  08/13/2014 FINDINGS: Cardiomediastinal silhouette is normal. Mediastinal contours appear intact. Calcific atherosclerotic disease and tortuosity of the aorta are seen. There is no evidence of focal airspace consolidation, pleural effusion or pneumothorax. There stable changes of advanced COPD and upper lobe predominant emphysema. The perceived blunting of the costophrenic angles on lateral views likely represents pleural reflection due to hyperinflation rather than pleural effusions. Osseous structures are without acute abnormality. Soft tissues are grossly normal. IMPRESSION: Changes of advanced emphysema with COPD, without evidence of acute airspace consolidation. Tortuosity and calcific atherosclerotic disease of the aorta. Electronically Signed   By: Fidela Salisbury M.D.   On: 03/25/2016 13:49   Other results: EKG: unreadable due to degree of artifact; repeat EKG pending.  Assessment & Plan by Problem: Active Problems:   * No active hospital problems. *  COPD Exacerbation: Ms. Lofton's shortness of breath likely represents a COPD exacerbation given her extensive smoking history, previous hospitalizations for COPD exacerbations that presented similarly, and distant breath  sounds on lung exam. The most likely exacerbating factor is a viral URI given her cough for the past 2 days and leukocytosis. Pneumonia is less likely given no evidence of acute airspace consolidation on CXR and lack of fever. Azithromycin given for COPD exacerbation will also cover microbes responsible for atypical community acquired pneumonia. Patient's troponin mildly elevated to 0.03, but cardiac cause of symptoms felt to be less likely due to lack of chest pain or peripheral edema and normal BNP. - Pulse oxygen saturation goal for acute COPD exacerbation is 88-92%, which patient is meeting on room air. Continuous pulse ox monitoring for need for supplemental O2. - Duoneb 51m nebulization q6hrs - Prednisone 40 mg PO daily - Azithromycin 500 mg PO tonight, followed by 250 mg PO daily starting tomorrow - Monitor leukocytosis on repeat CBC  Elevated LFTs and Alkaline Phosphatase: Ms. KGuardinohas a history of chronically elevated LFTs, likely secondary to her alcohol use. She drinks two 24 oz beers daily and three on weekends and her AST:ALT is elevated in the classic 2:1 ratio. Her alkaline phosphatase is elevated to 345, however, per chart review this is also chronic and was 379 as early as 2014. A GGT performed 01/2015 was elevated to 552 supporting a hepatobiliary cause of the alkaline phosphatase elevation. An abdominal ultrasound performed 05/2004 showed diffuse fatty infiltration of the liver as well as cholelithiasis and sludge in gallbladder fundus. A repeat ultrasound was considered 01/2015, but the patient was lost to follow-up. - Consider abdominal ultrasound to further explore chronically elevated LFTs/Alk Phos  Code Status: FULL CODE  This is a MCareers information officerNote.  The care of the patient was discussed with Dr. JWynetta Emeryand the assessment and plan was formulated with their assistance.  Please see their note for official documentation of the patient encounter.   Signed: JLawson Fiscal  Medical Student 03/25/2016, 5:40 PM

## 2016-03-25 NOTE — Progress Notes (Signed)
Received report from ED RN, John. 

## 2016-03-25 NOTE — ED Notes (Signed)
Pt made aware of bed assignment 

## 2016-03-25 NOTE — ED Triage Notes (Signed)
Pt from home, complains of SOB and chest congestion. Denies CP. Pt has COPD- but does not usually wear O2 at home. Pt was 93% on room air for EMS, 99% on 2L [place] from EMS. EKG HR 99, BP 132/89.

## 2016-03-25 NOTE — ED Provider Notes (Signed)
[facility] DEPT Provider Note   CSN: [id number] Arrival date & time: 03/25/16  1259     History   Chief Complaint Chief Complaint  Patient presents with  . Shortness of Breath    HPI Kayla Tyler is a 70 y.o. female.  HPI Patient has a history of COPD. She's had shortness of breath and cough the last few days. States she has not had her breathing treatments she states is they are expired. She's had a cough with minimal sputum production. No fevers. States she began to feel bad yesterday but feels worse this morning. Please get seen at the internal medicine clinic downstairs. No swelling in her legs. She had previously been on oxygen at home but stopped it around a year ago because she had been doing better.   Past Medical History:  Diagnosis Date  . Breast cancer ([place])   . COPD (chronic obstructive pulmonary disease) ([place])   . Hypertension     Patient Active Problem List   Diagnosis Date Noted  . Preventative health care 02/05/2015  . Postmenopausal estrogen deficiency 02/05/2015  . Tobacco use disorder 08/21/2014  . Elevated LFTs 08/21/2014  . Hyponatremia 08/12/2014  . Essential hypertension 06/22/2014  . Excessive drinking alcohol 04/30/2013  . COPD (chronic obstructive pulmonary disease) ([place]) 04/30/2013  . History of breast cancer 04/30/2013  . Hyperlipidemia 07/15/2006    Past Surgical History:  Procedure Laterality Date  . MASTECTOMY Left     OB History    No data available       Home Medications    Prior to Admission medications   Medication Sig Start Date End Date Taking? Authorizing Provider  Aspirin-Acetaminophen-Caffeine (GOODY HEADACHE PO) Take 1 packet by mouth daily as needed.   Yes Historical Provider, MD  folic acid (FOLVITE) 1 MG tablet Take 1 tablet (1 mg total) by mouth daily. Patient not taking: Reported on 03/25/2016 02/05/15   Juliet Rude, MD  levalbuterol [facility] HFA) 45 MCG/ACT inhaler Inhale 2 puffs into the lungs every 4  (four) hours as needed for wheezing. Patient not taking: Reported on 03/25/2016 02/05/15   Sindy Guadeloupe Rivet, MD  mometasone-formoterol (DULERA) 200-5 MCG/ACT AERO Inhale 2 puffs into the lungs 2 (two) times daily. Patient not taking: Reported on 03/25/2016 02/05/15   Juliet Rude, MD  Nutritional Supplements (ENSURE COMPLETE SHAKE) LIQD Take 1 Can by mouth 2 (two) times daily. Patient not taking: Reported on 03/25/2016 02/05/15   Juliet Rude, MD  thiamine 100 MG tablet Take 1 tablet (100 mg total) by mouth daily. Patient not taking: Reported on 03/25/2016 02/05/15   Sindy Guadeloupe Rivet, MD  tiotropium (SPIRIVA HANDIHALER) 18 MCG inhalation capsule Place 1 capsule (18 mcg total) into inhaler and inhale daily. Patient not taking: Reported on 03/25/2016 02/05/15   Juliet Rude, MD    Family History History reviewed. No pertinent family history.  Social History Social History  Substance Use Topics  . Smoking status: Current Every Day Smoker    Packs/day: 1.00    Years: 48.00  . Smokeless tobacco: Never Used     Comment: cutting back 3/4 pack  . Alcohol use 7.2 oz/week    12 Cans of beer per week     Allergies   Lisinopril and Penicillins   Review of Systems Review of Systems  Constitutional: Negative for appetite change.  Respiratory: Positive for cough and shortness of breath.   Cardiovascular: Positive for chest pain.  Gastrointestinal: Negative for abdominal pain.  Genitourinary: Negative for flank pain.  Musculoskeletal: Negative for back pain.  Skin: Negative for wound.     Physical Exam Updated Vital Signs BP 106/83   Pulse 116   Temp 97.5 F (36.4 C) (Oral)   Resp 21   SpO2 100%   Physical Exam  Constitutional: She appears well-developed.  Cachectic  HENT:  Head: Normocephalic.  Eyes: EOM are normal.  Neck: Neck supple.  Pulmonary/Chest:  Mildly harsh breath sounds and decreased air movement.  Abdominal: Soft. There is no tenderness.  Musculoskeletal: She exhibits  no edema.  Lymphadenopathy:    She has no cervical adenopathy.  Neurological: She is alert.  Skin: Skin is warm. Capillary refill takes less than 2 seconds.  Psychiatric: She has a normal mood and affect.     ED Treatments / Results  Labs (all labs ordered are listed, but only abnormal results are displayed) Labs Reviewed  CBC WITH DIFFERENTIAL/PLATELET - Abnormal; Notable for the following:       Result Value   WBC 13.9 (*)    RBC 3.70 (*)    MCV 104.3 (*)    MCH 36.2 (*)    Platelets 139 (*)    Neutro Abs 11.9 (*)    All other components within normal limits  TROPONIN I - Abnormal; Notable for the following:    Troponin I 0.03 (*)    All other components within normal limits  COMPREHENSIVE METABOLIC PANEL - Abnormal; Notable for the following:    Sodium 132 (*)    Chloride 94 (*)    BUN <5 (*)    Total Protein 8.6 (*)    Albumin 3.0 (*)    AST 75 (*)    Alkaline Phosphatase 345 (*)    Total Bilirubin 1.9 (*)    All other components within normal limits  BRAIN NATRIURETIC PEPTIDE  ETHANOL    EKG  EKG Interpretation  Date/Time:  Wednesday March 25 2016 13:02:05 EST Ventricular Rate:  128 PR Interval:    QRS Duration: 64 QT Interval:  390 QTC Calculation: 569 R Axis:   66 Text Interpretation:  Sinus rhythm Low voltage QRS Cannot rule out Anterior infarct , age undetermined Abnormal ECG baseline artifact makes interpretation difficult Confirmed by Alvino Chapel  MD, Brookes Craine [phone]) on 03/25/2016 1:50:05 PM       Radiology Dg Chest 2 View  Result Date: 03/25/2016 CLINICAL DATA:  Shortness of breath and dry cough. EXAM: CHEST  2 VIEW COMPARISON:  08/13/2014 FINDINGS: Cardiomediastinal silhouette is normal. Mediastinal contours appear intact. Calcific atherosclerotic disease and tortuosity of the aorta are seen. There is no evidence of focal airspace consolidation, pleural effusion or pneumothorax. There stable changes of advanced COPD and upper lobe predominant  emphysema. The perceived blunting of the costophrenic angles on lateral views likely represents pleural reflection due to hyperinflation rather than pleural effusions. Osseous structures are without acute abnormality. Soft tissues are grossly normal. IMPRESSION: Changes of advanced emphysema with COPD, without evidence of acute airspace consolidation. Tortuosity and calcific atherosclerotic disease of the aorta. Electronically Signed   By: Fidela Salisbury M.D.   On: 03/25/2016 13:49    Procedures Procedures (including critical care time)  Medications Ordered in ED Medications  ipratropium-albuterol (DUONEB) 0.5-2.5 (3) MG/3ML nebulizer solution 3 mL (3 mLs Nebulization Given 03/25/16 1413)  methylPREDNISolone sodium succinate (SOLU-MEDROL) 125 mg/2 mL injection 125 mg (125 mg Intravenous Given 03/25/16 1412)     Initial Impression / Assessment and Plan / ED Course  I have  reviewed the triage vital signs and the nursing notes.  Pertinent labs & imaging results that were available during my care of the patient were reviewed by me and considered in my medical decision making (see chart for details).  Clinical Course     Patient presents with shortness of breath. His had it for last couple days. Had previously been on oxygen. Dyspnea is improved somewhat but still tachycardic. Has had cough with some mild sputum production. Feels better after breathing treatment with troponin minimally elevated. Likely secondary to respiratiory status. Will admit to internal medicine.  Final Clinical Impressions(s) / ED Diagnoses   Final diagnoses:  COPD exacerbation [facility])    New Prescriptions New Prescriptions   No medications on file     Davonna Belling, MD 03/25/16 [phone]

## 2016-03-25 NOTE — ED Notes (Signed)
Report attempted 

## 2016-03-25 NOTE — H&P (Signed)
Date: 03/25/2016               Patient Name:  Kayla Tyler MRN: [id number]  DOB: 01-12-1946 Age / Sex: 70 y.o., female   PCP: Juliet Rude, MD         Medical Service: Internal Medicine Teaching Service         Attending Physician: Dr. Joni Reining    First Contact: Purvis Sheffield, [place] [address] Pager: [phone]  Second Contact: Dr. Asencion Partridge Pager: [phone]  Third Contact  Dr. Burgess Estelle Pager: [phone]  After Hours (After 5p/  First Contact Pager: [phone]  weekends / holidays): Second Contact Pager: [phone]   Chief Complaint: Shortness of breath  History of Present Illness: Kayla Tyler is a 70 y.o. woman with PMH COPD, Breast cancer (left, s/p mastectomy), HTN, HLD, ETOH abuse, and tobacco abuse who presents for shortness of breath. [name] reports her symptoms started yesterday with increased shortness of breath. She spent the day "in front of a ceiling fan" or with her "head out the window" trying to breathe easier. She noticed a cough productive of white sputum but today it is dry. She endorses increased fatigue that is now impairing ADLs. She is not taking any medications except for occasional Goody's powder. She states that her last COPD exacerbation was about a year ago. She has not been on her inhalers or other prescribed medications since a clinic visit last year. She smokes 0.5-1 ppd (x40 yrs) and drinks approx 48 oz of beer daily and 72 oz on weekends. Her last drink was last night. She denies fever/chills, congestion, sore throat, chest pain, nausea/vomiting, sick contacts.  In the ED - vitals 97.5, HR 128, RR 25, BP 102/79. CXR showing advanced emphysema/COPD changes without acute findings and EKG showed sinus tach, low voltage. Labs significant for WBC 13.9 (85% neutrophils), Hb 13.4, MCV 104, platelets at 139, on CMP albumin 3.0, AST 75, ALP 345, Tbili 1.9, Cr stable at 0.69, troponin negative x2. She received Duoneb x1 and Solumedrol 125 mg IV.   Meds:  Current Meds    Medication Sig  . Aspirin-Acetaminophen-Caffeine (GOODY HEADACHE PO) Take 1 packet by mouth daily as needed.    Allergies: Allergies as of 03/25/2016 - Review Complete 03/25/2016  Allergen Reaction Noted  . Lisinopril Swelling 08/12/2014  . Penicillins Other (See Comments) 04/30/2013   Past Medical History:  Diagnosis Date  . Breast cancer ([place])   . COPD (chronic obstructive pulmonary disease) ([place])   . Hypertension     Family History:  Family History  Problem Relation Age of Onset  . Heart attack Father 15  . Stroke Sister 53  . Diabetes Sister   . Hypertension Brother     Social History:  Social History   Social History  . Marital status: Widowed    Spouse name: N/A  . Number of children: N/A  . Years of education: N/A   Occupational History  . Not on file.   Social History Main Topics  . Smoking status: Current Every Day Smoker    Packs/day: 0.50    Years: 48.00    Types: Cigarettes  . Smokeless tobacco: Never Used     Comment: cutting back 3/4 pack  . Alcohol use 19.2 oz/week    32 Cans of beer per week  . Drug use: No  . Sexual activity: Not on file   Other Topics Concern  . Not on file   Social History  Narrative   Lives with son.    Review of Systems: A complete ROS was negative except as per HPI.   Physical Exam: Blood pressure 122/77, pulse 105, temperature 97.7 F (36.5 C), temperature source Oral, resp. rate 24, SpO2 98 %.  General appearance: Frail, cachectic elderly woman resting comfortably in bed, in no distress HENT: Normocephalic, atraumatic Eyes: PERRL, EOM inact, non-icteric Cardiovascular: Tachycardic rate and rhythm, no murmurs, rubs, gallops Respiratory: Diminished breath sounds bilaterally, normal work of breathing Abdomen: BS+, soft, non-tender, non-distended Extremities: Very thin bulk, normal range of motion, no edema, 2+ peripheral pulses Skin: Warm, dry, intact Neuro: Alert and oriented, cranial nerves grossly  intact Psych: Positive affect, clear speech, occasional nonsensical answers to questions  EKG: Sinus tach, low V  CXR: Dg Chest 2 View  Result Date: 03/25/2016 CLINICAL DATA:  Shortness of breath and dry cough. EXAM: CHEST  2 VIEW COMPARISON:  08/13/2014 FINDINGS: Cardiomediastinal silhouette is normal. Mediastinal contours appear intact. Calcific atherosclerotic disease and tortuosity of the aorta are seen. There is no evidence of focal airspace consolidation, pleural effusion or pneumothorax. There stable changes of advanced COPD and upper lobe predominant emphysema. The perceived blunting of the costophrenic angles on lateral views likely represents pleural reflection due to hyperinflation rather than pleural effusions. Osseous structures are without acute abnormality. Soft tissues are grossly normal. IMPRESSION: Changes of advanced emphysema with COPD, without evidence of acute airspace consolidation. Tortuosity and calcific atherosclerotic disease of the aorta. Electronically Signed   By: Ted Mcalpine M.D.   On: 03/25/2016 13:49   Assessment & Plan by Problem: [name] is a 70 yo woman with PMH COPD, breast cancer, HTN, HLD, alcohol and tobacco abuse admitted for presumed COPD exacerbation  Shortness of breath, likely COPD exacerbation secondary to URI with increased sputum production yesterday, WBC 13.9 with some tachycardia on admission, patient reports being off all inhalers for the past year but gives nonsensical answers as to why this is the case (and many other questions), will have to explore social situation. CXR shows no obvious pneumonia, EKG and troponin negative x2. - Continuous pulse ox, goal O2 88%-93% as patient is chronic COPD, stable on RA currently but can supplemental O2 with signif desat - Duoneb Q6 scheduled - Prednisone 40 mg PO daily - Azithromycin 500 mg PO daily - Follow CBC  Tachycardia, sinus tach on EKG, patient cachectic and mildly dry appearing on exam,  clearly malnourished and likely dehydrated - mIVF 75 cc/hr overnight  Alcohol abuse, last drink last night, 24-72oz+ beer daily, suspect some degree of confabulation during conversation    - CIWA protocol, thiamine, folate  Elevated LFTs and Alkaline Phosphatase, chronically elevated LFTs, likely secondary to her alcohol use. ALP elevated at 345, GGT performed 01/2015 was elevated to 552, abdominal US from 05/2004 showed diffuse fatty infiltration of the liver as well as cholelithiasis and sludge in gallbladder fundus. A repeat ultrasound was considered 01/2015, but the patient was lost to follow-up. - Consider abdominal ultrasound to further explore chronically elevated LFTs/Alk Phos, likely hepatic cirrhosis  Alcohol abuse, last drink yesterday, typical 48 oz beer on weekdays, more on weekends - CIWA monitoring  Vitamin D deficiency - outpatient workup - consider supplementation  Cachexia and deconditioning, severely malnourished and thin appearance, unclear diet at home, COPD vs alcohol abuse/poor dietary intake vs occult malignancy. Dietary supported by MCV 104 suggesting folate/B12 deficiency. - Consider nutrition consult  - Continue home ensure supplement BID  FEN/GI: HH diet, replete electrolytes  as needed  DVT ppx: Lovenox  Code status: desires Full Code after discussion  Dispo: Admit patient to Observation with expected length of stay less than 2 midnights.  Signed: Asencion Partridge, MD 03/25/2016, 7:14 PM  Pager: [phone]

## 2016-03-26 ENCOUNTER — Telehealth: Payer: Self-pay

## 2016-03-26 DIAGNOSIS — J441 Chronic obstructive pulmonary disease with (acute) exacerbation: Secondary | ICD-10-CM | POA: Diagnosis not present

## 2016-03-26 DIAGNOSIS — R945 Abnormal results of liver function studies: Secondary | ICD-10-CM | POA: Diagnosis not present

## 2016-03-26 DIAGNOSIS — E559 Vitamin D deficiency, unspecified: Secondary | ICD-10-CM

## 2016-03-26 DIAGNOSIS — R000 Tachycardia, unspecified: Secondary | ICD-10-CM

## 2016-03-26 DIAGNOSIS — IMO0002 Reserved for concepts with insufficient information to code with codable children: Secondary | ICD-10-CM | POA: Diagnosis present

## 2016-03-26 DIAGNOSIS — Z880 Allergy status to penicillin: Secondary | ICD-10-CM

## 2016-03-26 DIAGNOSIS — Z888 Allergy status to other drugs, medicaments and biological substances status: Secondary | ICD-10-CM

## 2016-03-26 DIAGNOSIS — Z681 Body mass index (BMI) 19 or less, adult: Secondary | ICD-10-CM

## 2016-03-26 DIAGNOSIS — R748 Abnormal levels of other serum enzymes: Secondary | ICD-10-CM | POA: Diagnosis not present

## 2016-03-26 DIAGNOSIS — I700 Atherosclerosis of aorta: Secondary | ICD-10-CM

## 2016-03-26 DIAGNOSIS — Z9012 Acquired absence of left breast and nipple: Secondary | ICD-10-CM

## 2016-03-26 DIAGNOSIS — Z853 Personal history of malignant neoplasm of breast: Secondary | ICD-10-CM

## 2016-03-26 DIAGNOSIS — F17210 Nicotine dependence, cigarettes, uncomplicated: Secondary | ICD-10-CM

## 2016-03-26 DIAGNOSIS — F1020 Alcohol dependence, uncomplicated: Secondary | ICD-10-CM

## 2016-03-26 DIAGNOSIS — E43 Unspecified severe protein-calorie malnutrition: Secondary | ICD-10-CM

## 2016-03-26 LAB — BASIC METABOLIC PANEL
Anion gap: 9 (ref 5–15)
BUN: 5 mg/dL — AB (ref 6–20)
CALCIUM: 8.6 mg/dL — AB (ref 8.9–10.3)
CHLORIDE: 92 mmol/L — AB (ref 101–111)
CO2: 27 mmol/L (ref 22–32)
CREATININE: 0.67 mg/dL (ref 0.44–1.00)
GFR calc Af Amer: >60 mL/min (ref >60)
GFR calc non Af Amer: >60 mL/min (ref >60)
Glucose, Bld: 146 mg/dL — ABNORMAL HIGH (ref 65–99)
Potassium: 3.4 mmol/L — ABNORMAL LOW (ref 3.5–5.1)
SODIUM: 128 mmol/L — AB (ref 135–145)

## 2016-03-26 LAB — CBC
HEMATOCRIT: 32.2 % — AB (ref 36.0–46.0)
HEMOGLOBIN: 11.2 g/dL — AB (ref 12.0–15.0)
MCH: 35.9 pg — AB (ref 26.0–34.0)
MCHC: 34.8 g/dL (ref 30.0–36.0)
MCV: 103.2 fL — ABNORMAL HIGH (ref 78.0–100.0)
Platelets: 126 10*3/uL — ABNORMAL LOW (ref 150–400)
RBC: 3.12 MIL/uL — ABNORMAL LOW (ref 3.87–5.11)
RDW: 13.7 % (ref 11.5–15.5)
WBC: 10.5 10*3/uL (ref 4.0–10.5)

## 2016-03-26 LAB — GLUCOSE, CAPILLARY: GLUCOSE-CAPILLARY: 178 mg/dL — AB (ref 65–99)

## 2016-03-26 MED ORDER — MOMETASONE FURO-FORMOTEROL FUM 200-5 MCG/ACT IN AERO: 2.0000 | INHALATION_SPRAY | Freq: Two times a day (BID) | RESPIRATORY_TRACT | 11 refills | Status: AC

## 2016-03-26 MED ORDER — POTASSIUM CHLORIDE CRYS ER 10 MEQ PO TBCR
30.0000 meq | EXTENDED_RELEASE_TABLET | Freq: Once | ORAL | Status: AC
  Administered 2016-03-26: 30 meq via ORAL
  Filled 2016-03-26: qty 1

## 2016-03-26 MED ORDER — ACETAMINOPHEN 325 MG PO TABS
650.0000 mg | ORAL_TABLET | Freq: Once | ORAL | Status: AC
  Administered 2016-03-26: 650 mg via ORAL
  Filled 2016-03-26: qty 2

## 2016-03-26 MED ORDER — GUAIFENESIN-DM 100-10 MG/5ML PO SYRP
5.0000 mL | ORAL_SOLUTION | ORAL | Status: DC | PRN
  Administered 2016-03-26: 5 mL via ORAL
  Filled 2016-03-26: qty 5

## 2016-03-26 MED ORDER — TIOTROPIUM BROMIDE MONOHYDRATE 18 MCG IN CAPS: 18.0000 ug | ORAL_CAPSULE | Freq: Every day | RESPIRATORY_TRACT | 12 refills | Status: AC

## 2016-03-26 MED ORDER — AZITHROMYCIN 500 MG PO TABS: ORAL_TABLET | ORAL | 0 refills | Status: DC

## 2016-03-26 MED ORDER — PREDNISONE 20 MG PO TABS: 40.0000 mg | ORAL_TABLET | Freq: Every day | ORAL | 0 refills | Status: DC

## 2016-03-26 NOTE — Discharge Instructions (Signed)
Please start taking your inhalers for your COPD Please take antibiotic  Tomorrow Please take the prednisone for 3 more days

## 2016-03-26 NOTE — Telephone Encounter (Signed)
Lorrane from [facility] needs to speak with a nurse regarding pt.

## 2016-03-26 NOTE — Progress Notes (Signed)
NURSING PROGRESS NOTE  DESEREA EIDSON [id number] Admission Data: 03/26/2016 12:45 AM Attending Provider: Lucious Groves, DO [id number], Carly, MD Code Status: FULL  Allergies:  Lisinopril and Penicillins Past Medical History:   has a past medical history of Breast cancer ([place]); COPD (chronic obstructive pulmonary disease) ([place]); and Hypertension. Past Surgical History:   has a past surgical history that includes Mastectomy (Left). Social History:   reports that she has been smoking Cigarettes.  She has a 24.00 pack-year smoking history. She has never used smokeless tobacco. She reports that she drinks about 19.2 oz of alcohol per week . She reports that she does not use drugs.  Thekla KEEANA BEARFIELD is a 70 y.o. female patient admitted from ED:   Last Documented Vital Signs: Blood pressure 115/67, pulse 97, temperature 98 F (36.7 C), temperature source Oral, resp. rate 17, height 5\' 5"  (1.651 m), weight 34.2 kg (75 lb 6.4 oz), SpO2 100 %.  IV Fluids:  IV in place, occlusive dsg intact without redness, IV cath antecubital right, condition patent and no redness normal saline.   Skin: Appropriate for ethnicity, dry and flaky.  Calluses bilateral feet.   Patient orientated to room. Information packet given to patient. Side rails up x 2, fall assessment and education completed with patient. Patient able to verbalize understanding of risk associated with falls and verbalized understanding to call for assistance before getting out of bed. Call light within reach. Patient able to voice and demonstrate understanding of unit orientation instructions.    Will continue to evaluate and treat per MD orders.  Clydell Hakim RN, BSN

## 2016-03-26 NOTE — Progress Notes (Signed)
Kayla Tyler to be D/C'd Home per MD order.  Discussed with the patient and all questions fully answered.  VSS, Skin clean, dry and intact without evidence of skin break down, no evidence of skin tears noted. IV catheter discontinued intact. Site without signs and symptoms of complications. Dressing and pressure applied.  An After Visit Summary was printed and given to the patient. Patient received prescription.  D/c education completed with patient/family (son) including follow up instructions, medication list, d/c activities limitations if indicated, with other d/c instructions as indicated by MD - patient able to verbalize understanding, all questions fully answered.   Patient instructed to return to ED, call 911, or call MD for any changes in condition.   Patient escorted via [place], and D/C home via private auto.  Kayla Tyler 03/26/2016 2:08 PM

## 2016-03-26 NOTE — Care Management Obs Status (Signed)
[place] NOTIFICATION   Patient Details  Name: Kayla Tyler MRN: [id number] Date of Birth: [birth year]   Medicare Observation Status Notification Given:  Yes    Sharin Mons, RN 03/26/2016, 10:36 AM

## 2016-03-26 NOTE — Discharge Summary (Signed)
Name: Kayla Tyler MRN: [id number] DOB: 05-03-1946 70 y.o. PCP: Juliet Rude, MD  Date of Admission: 03/25/2016  1:24 PM Date of Discharge: 03/26/2016 Attending Physician: Lucious Groves, DO  Discharge Diagnosis: 1. COPD exacerbation  Active Problems:   COPD exacerbation ([place])   Aortic atherosclerosis ([place])   Discharge Medications:   Medication List    STOP taking these medications   GOODY HEADACHE PO     TAKE these medications   azithromycin 500 MG tablet Commonly known as:  ZITHROMAX Take 1 tab on Nov 10th in morning   ENSURE COMPLETE SHAKE Liqd Take 1 Can by mouth 2 (two) times daily.   folic acid 1 MG tablet Commonly known as:  FOLVITE Take 1 tablet (1 mg total) by mouth daily.   levalbuterol 45 MCG/ACT inhaler Commonly known as:  XOPENEX HFA Inhale 2 puffs into the lungs every 4 (four) hours as needed for wheezing.   mometasone-formoterol 200-5 MCG/ACT Aero Commonly known as:  DULERA Inhale 2 puffs into the lungs 2 (two) times daily.   predniSONE 20 MG tablet Commonly known as:  DELTASONE Take 2 tablets (40 mg total) by mouth daily with breakfast. For 3 more days Start taking on:  03/27/2016   thiamine 100 MG tablet Take 1 tablet (100 mg total) by mouth daily.   tiotropium 18 MCG inhalation capsule Commonly known as:  SPIRIVA HANDIHALER Place 1 capsule (18 mcg total) into inhaler and inhale daily.       Disposition and follow-up:   [name] was [facility] from [facility] in Stable condition.  At the hospital follow up visit please address:  1.  COPD - assess for cough and shortness of breath, adherence to daily inhalers  Substance abuse - reports drinking 48+oz beer and smoking 1/2 pack daily, encourage cessation  Chronic elevated LFTs, ALP, history of hepatic steatosis - consider repeat imaging to assess level of cirrhosis and or screen for hepatocellular carcinoma  Vitamin D deficiency - level <4 in September,  supplement and consider workup for cause of deficiency  Cachexia/malnutrition, current BMI 13 - assess caloric intake and optimize nutrition, screen for underlying malignancy  2.  Labs / imaging needed at time of follow-up: CBC, CMP, vitamin D level, abdominal ultrasound  3.  Pending labs/ test needing follow-up: None  Follow-up Appointments: Follow-up Information    Rivet, Bethann Berkshire, MD Follow up.   Specialty:  Internal Medicine Why:  9:15 AM Contact information: [place] [address] [phone]           Hospital Course by problem list: Active Problems:   COPD exacerbation ([place])   Aortic atherosclerosis ([place])   1. COPD exacerbation  Ms. [name] is a 70 yo woman with PMH COPD, breast cancer, HTN, HLD, alcohol and tobacco abuse admitted on 11/8 for shortness of breath and felt to have COPD exacerbation. She reported ongoing smoking and no inhaler use for the past year, for some reason thought they had been discontinued. Cardiac workup negative. Symptoms improved with scheduled duonebs, prednisone, and Azithromycin. Her symptoms were resolved on 11/9 and she was deemed stable for discharge home with clinic follow up. We resumed her home inhalers (Spiriva, Dulera, Xopenex) and provided a prescription for po Prednisone burst and Z-pack/Azithromycin  Discharge Vitals:   BP (!) 94/55 (BP Location: Right Arm)   Pulse (!) 104   Temp 97.8 F (36.6 C) (Oral)   Resp 17   Ht 5\' 5"  (1.651  m)   Wt 34.7 kg (76 lb 9.6 oz)   SpO2 96%   BMI 12.75 kg/m   Pertinent Labs, Studies, and Procedures:   CBC Latest Ref Rng & Units 03/26/2016 03/25/2016 08/14/2014  WBC 4.0 - 10.5 K/uL 10.5 13.9(H) 6.4  Hemoglobin 12.0 - 15.0 g/dL 11.2(L) 13.4 11.4(L)  Hematocrit 36.0 - 46.0 % 32.2(L) 38.6 31.5(L)  Platelets 150 - 400 K/uL 126(L) 139(L) 198    CMP Latest Ref Rng & Units 03/26/2016 03/25/2016 02/05/2015  Glucose 65 - 99 mg/dL 146(H) 90 83  BUN 6 - 20 mg/dL 5(L) <5(L) 4(L)  Creatinine  0.44 - 1.00 mg/dL 0.67 0.69 0.57  Sodium 135 - 145 mmol/L 128(L) 132(L) 132(L)  Potassium 3.5 - 5.1 mmol/L 3.4(L) 4.2 4.6  Chloride 101 - 111 mmol/L 92(L) 94(L) 91(L)  CO2 22 - 32 mmol/L 27 28 23   Calcium 8.9 - 10.3 mg/dL 8.6(L) 8.9 9.3  Total Protein 6.5 - 8.1 g/dL - 8.6(H) 7.8  Total Bilirubin 0.3 - 1.2 mg/dL - 1.9(H) 0.6  Alkaline Phos 38 - 126 U/L - 345(H) 366(H)  AST 15 - 41 U/L - 75(H) 57(H)  ALT 14 - 54 U/L - 42 22   Dg Chest 2 View  Result Date: 03/25/2016 CLINICAL DATA:  Shortness of breath and dry cough. EXAM: CHEST  2 VIEW COMPARISON:  08/13/2014 FINDINGS: Cardiomediastinal silhouette is normal. Mediastinal contours appear intact. Calcific atherosclerotic disease and tortuosity of the aorta are seen. There is no evidence of focal airspace consolidation, pleural effusion or pneumothorax. There stable changes of advanced COPD and upper lobe predominant emphysema. The perceived blunting of the costophrenic angles on lateral views likely represents pleural reflection due to hyperinflation rather than pleural effusions. Osseous structures are without acute abnormality. Soft tissues are grossly normal. IMPRESSION: Changes of advanced emphysema with COPD, without evidence of acute airspace consolidation. Tortuosity and calcific atherosclerotic disease of the aorta. Electronically Signed   By: Fidela Salisbury M.D.   On: 03/25/2016 13:49    Discharge Instructions: Discharge Instructions    Diet - low sodium heart healthy    Complete by:  As directed    Increase activity slowly    Complete by:  As directed       Signed: Asencion Partridge, MD 03/26/2016, 10:36 AM   Pager: [phone]

## 2016-03-26 NOTE — Progress Notes (Signed)
Subjective: Kayla Tyler reports her breathing is much improved this morning. She denies chest pain, palpitations, and SOB. She complains of new frontal headache, for which we added acetaminophen PRN. Her only other complaint is that she wasn't able to sleep well due to continued interruptions for breathing treatments and IVF overnight. She feels she is ready to return home today. Discussed the need to use her inhalers, quit smoking, and follow up in our clinic in the near future. She voiced agreement and interest in returning home today.   Objective: Vital signs in last 24 hours: Vitals:   03/25/16 2209 03/26/16 0513 03/26/16 0911 03/26/16 1330  BP: 115/67 (!) 94/55  106/68  Pulse: 97 (!) 104  100  Resp: _0 Temp: 98 F (36.7 C) 97.8 F (36.6 C)  98.2 F (36.8 C)  TempSrc: Oral Oral  Oral  SpO2: 100% 98% 96% 97%  Weight:  34.7 kg (76 lb 9.6 oz)    Height:        Intake/Output Summary (Last 24 hours) at 03/26/16 2044 Last data filed at 03/26/16 0645  Gross per 24 hour  Intake              820 ml  Output                0 ml  Net              820 ml    Physical Exam General appearance: Frail, cachectic elderly woman resting comfortably in bed, in no distress HENT: Normocephalic, atraumatic, moist mucous membranes Cardiovascular: Tachycardic rate and rhythm, no murmurs, rubs, gallops Respiratory: Diminished breath sounds bilaterally, normal work of breathing Abdomen: BS+, soft, non-tender, non-distended Extremities: Very thin bulk, normal range of motion, no edema, 2+ peripheral pulses Skin: Warm, dry, intact Psych: Positive affect, clear speech, occasional nonsensical answers to questions  Labs / Imaging / Procedures: CBC Latest Ref Rng & Units 03/26/2016 03/25/2016 08/14/2014  WBC 4.0 - 10.5 K/uL 10.5 13.9(H) 6.4  Hemoglobin 12.0 - 15.0 g/dL 11.2(L) 13.4 11.4(L)  Hematocrit 36.0 - 46.0 % 32.2(L) 38.6 31.5(L)  Platelets 150 - 400 K/uL 126(L) 139(L) 198   BMP Latest  Ref Rng & Units 03/26/2016 03/25/2016 02/05/2015  Glucose 65 - 99 mg/dL 146(H) 90 83  BUN 6 - 20 mg/dL 5(L) <5(L) 4(L)  Creatinine 0.44 - 1.00 mg/dL 0.67 0.69 0.57  BUN/Creat Ratio 11 - 26 - - 7(L)  Sodium 135 - 145 mmol/L 128(L) 132(L) 132(L)  Potassium 3.5 - 5.1 mmol/L 3.4(L) 4.2 4.6  Chloride 101 - 111 mmol/L 92(L) 94(L) 91(L)  CO2 22 - 32 mmol/L _1 Calcium 8.9 - 10.3 mg/dL 8.6(L) 8.9 9.3   No results found.  Assessment/Plan: Kayla Tyler is a 70 yo woman with PMH COPD, breast cancer, HTN, HLD, alcohol and tobacco abuse admitted for presumed COPD exacerbation  Shortness of breath, likely COPD exacerbation secondary to URI with increased sputum production yesterday, WBC 13.9 with some tachycardia on admission, patient reports being off all inhalers for the past year but gives nonsensical answers as to why this is the case (and many other questions), will have to explore social situation. CXR shows no obvious pneumonia, EKG and troponin negative x2. - Resume home COPD inhalers (Spiriva, Dulera, Xopenex) - Prednisone 40 mg PO daily x 4-5days - Azithromycin 500 mg PO x1, then 250 mg for 4 following days (z-pack) - Follow CBC  Tachycardia, sinus tach on EKG, patient  cachectic and mildly dry appearing on exam, clearly malnourished and likely dehydrated, ongoing this AM, less dry on exam - mIVF 75 cc/hr overnight  Alcohol abuse, last drink last night, 24-72oz+ beer daily, suspect some degree of confabulation during conversation    - CIWA protocol, thiamine, folate  Elevated LFTs and Alkaline Phosphatase, chronically elevated LFTs, likely secondary to her alcohol use. ALP elevated at 345, GGT performed 01/2015 was elevated to 552, abdominal US from 05/2004 showed diffuse fatty infiltration of the liver as well as cholelithiasis and sludge in gallbladder fundus. A repeat ultrasound was considered 01/2015, but the patient was lost to follow-up. - Consider future abdominal ultrasound to  further explore chronically elevated LFTs/Alk Phos, likely hepatic cirrhosis  Vitamin D deficiency - outpatient workup - consider supplementation  Cachexia and deconditioning, severely malnourished and thin appearance, unclear diet at home, COPD vs alcohol abuse/poor dietary intake vs occult malignancy. Dietary supported by MCV 104 suggesting folate/B12 deficiency. - Continue home ensure supplement BID  FEN/GI: HH diet, replete electrolytes as needed  DVT ppx: Lovenox  Dispo: Anticipated discharge today.   LOS: 1 day   Asencion Partridge, MD 03/26/2016, 8:44 PM Pager: [phone]

## 2016-03-26 NOTE — Care Management CC44 (Signed)
Condition Code 44 Documentation Completed  Patient Details  Name: Kayla Tyler MRN: [id number] Date of Birth: Dec 23, 1945   Condition Code 44 given:   yes Patient signature on Condition Code 44 notice:   yes Documentation of 2 MD's agreement:   yes Code 44 added to claim:   yes    Sharin Mons, RN 03/26/2016, 10:36 AM

## 2016-03-26 NOTE — Consult Note (Signed)
            [facility] Primary Care Navigator  03/26/2016  ANUSHKA LABARGE 1946-04-29 [id number]   Wentto see patient in the room to identify possible discharge needs but staff states patient was already discharged.  Primary care provider's office called(May)and notified of patient's discharge and need for post hospital follow-up and transition of care.Writer was notified that patient has a scheduled post discharge follow-up on Monday 03/30/16. Made aware to refer patient to [facility] care management for care coordination needs if deemed appropriate.Contact information was provided for further inquiries.  For additional questions please contact:  Edwena Felty A. Khiana Camino, BSN, RN-BC [facility] PRIMARY CARE Navigator Cell: [phone]

## 2016-03-26 NOTE — Progress Notes (Signed)
Subjective: No acute events overnight. This morning, [name] reports her breathing is much improved. She denies chest pain, palpitations, and SOB. She complains of a headache, for which we added acetaminophen PRN. Her only other complaint is that she wasn't able to sleep well due to continued interruptions for breathing treatments and IVF. She feels she is ready to return home today. We reiterated the role that smoking plays in the worsening of COPD symptoms and advised her to cut down or quit.  Objective: Vital signs in last 24 hours: Vitals:   03/25/16 2101 03/25/16 2149 03/25/16 2209 03/26/16 0513  BP: 126/85  115/67 (!) 94/55  Pulse: 97  97 (!) 104  Resp: _0 Temp:   98 F (36.7 C) 97.8 F (36.6 C)  TempSrc:   Oral Oral  SpO2: 99%  100% 98%  Weight:  34.2 kg (75 lb 6.4 oz)  34.7 kg (76 lb 9.6 oz)  Height:  _1  (1.651 m)     Filed Weights   03/25/16 2149 03/26/16 0513  Weight: 34.2 kg (75 lb 6.4 oz) 34.7 kg (76 lb 9.6 oz)    Intake/Output Summary (Last 24 hours) at 03/26/16 0854 Last data filed at 03/26/16 0645  Gross per 24 hour  Intake              820 ml  Output                0 ml  Net              820 ml   BP (!) 94/55 (BP Location: Right Arm)   Pulse (!) 104   Temp 97.8 F (36.6 C) (Oral)   Resp 17   Ht _2  (1.651 m)   Wt 34.7 kg (76 lb 9.6 oz)   SpO2 98%   BMI 12.75 kg/m   Blood pressure 109/77, pulse 110, temperature 97.5 F (36.4 C), temperature source Oral, resp. rate 22, SpO2 96 %. General: Alert, cooperative, cachectic woman in no acute distress Head: Normocephalic, atraumatic Eyes: PERRLA, mildly icteric sclera with hyperpigmented lesion lateral to left pupil CV: RRR, no m/r/g heard on exam today Pulm: Breath sounds distant, lungs clear to auscultation bilaterally Abdomen: Non-tender, non-distended, normoactive bowel sounds Extremities: No peripheral edema Skin: Warm, dry; flaking skin bilateral lower extremities and back Psych:  Pleasant, normal mood and affect Neuro: CN II-XII grossly intact, grossly normal movement of all extremities  Lab Results: CBC Latest Ref Rng & Units 03/26/2016 03/25/2016 08/14/2014  WBC 4.0 - 10.5 K/uL 10.5 13.9(H) 6.4  Hemoglobin 12.0 - 15.0 g/dL 11.2(L) 13.4 11.4(L)  Hematocrit 36.0 - 46.0 % 32.2(L) 38.6 31.5(L)  Platelets 150 - 400 K/uL 126(L) 139(L) 198   BMP Latest Ref Rng & Units 03/26/2016 03/25/2016 02/05/2015  Glucose 65 - 99 mg/dL 146(H) 90 83  BUN 6 - 20 mg/dL 5(L) <5(L) 4(L)  Creatinine 0.44 - 1.00 mg/dL 0.67 0.69 0.57  BUN/Creat Ratio 11 - 26 - - 7(L)  Sodium 135 - 145 mmol/L 128(L) 132(L) 132(L)  Potassium 3.5 - 5.1 mmol/L 3.4(L) 4.2 4.6  Chloride 101 - 111 mmol/L 92(L) 94(L) 91(L)  CO2 22 - 32 mmol/L _3 Calcium 8.9 - 10.3 mg/dL 8.6(L) 8.9 9.3   Micro Results: No results found for this or any previous visit (from the past 240 hour(s)). Studies/Results: Dg Chest 2 View  Result Date: 03/25/2016 CLINICAL DATA:  Shortness of breath and dry cough. EXAM: CHEST  2 VIEW COMPARISON:  08/13/2014 FINDINGS: Cardiomediastinal silhouette is normal. Mediastinal contours appear intact. Calcific atherosclerotic disease and tortuosity of the aorta are seen. There is no evidence of focal airspace consolidation, pleural effusion or pneumothorax. There stable changes of advanced COPD and upper lobe predominant emphysema. The perceived blunting of the costophrenic angles on lateral views likely represents pleural reflection due to hyperinflation rather than pleural effusions. Osseous structures are without acute abnormality. Soft tissues are grossly normal. IMPRESSION: Changes of advanced emphysema with COPD, without evidence of acute airspace consolidation. Tortuosity and calcific atherosclerotic disease of the aorta. Electronically Signed   By: Fidela Salisbury M.D.   On: 03/25/2016 13:49   Medications: I have reviewed the patient's current medications. Scheduled Meds: . acetaminophen   650 mg Oral Once  . azithromycin  500 mg Oral Daily  . enoxaparin (LOVENOX) injection  20 mg Subcutaneous Q24H  . feeding supplement (ENSURE ENLIVE)  1 Bottle Oral BID  . folic acid  1 mg Oral Daily  . Influenza vac split quadrivalent PF  0.5 mL Intramuscular Tomorrow-1000  . ipratropium-albuterol  3 mL Nebulization Q6H  . mometasone-formoterol  2 puff Inhalation BID  . multivitamin with minerals  1 tablet Oral Daily  . nicotine  7 mg Transdermal Once  . potassium chloride  30 mEq Oral Once  . predniSONE  40 mg Oral Q breakfast  . senna-docusate  1 tablet Oral QHS  . thiamine  100 mg Oral Daily   Or  . thiamine  100 mg Intravenous Daily  . tiotropium  18 mcg Inhalation Daily   Continuous Infusions: PRN Meds:.acetaminophen **OR** acetaminophen, guaiFENesin-dextromethorphan, LORazepam **OR** LORazepam, ondansetron **OR** ondansetron (ZOFRAN) IV Assessment/Plan: Active Problems:   COPD exacerbation ([place])   Aortic atherosclerosis ([place])  [name] is a 70 y.o. woman with a history of HTN, COPD, HLD, alcohol use disorder, tobacco use disorder, and remote history of breast cancer s/p mastectomy who was admitted 11/8 for shortness of breath and is improving with treatment for likely COPD exacerbation.  COPD Exacerbation: [name]'s shortness of breath likely represents a COPD exacerbation given her extensive smoking history, previous hospitalizations for COPD exacerbations that presented similarly, and distant breath sounds on lung exam. The most likely exacerbating factor is a viral URI given her cough for the past 2 days. Pneumonia is less likely given no evidence of acute airspace consolidation on CXR, lack of fever, and resolution of leukocytosis. Azithromycin given for COPD exacerbation will also cover microbes responsible for atypical community acquired pneumonia. - Pulse oxygen saturation goal for acute COPD exacerbation is 88-92%, which patient is meeting on room air. Continuous pulse  ox monitoring for need for supplemental O2. - Duoneb 37m nebulization q6hrs - Prednisone 40 mg PO daily for 5 days (day 1/5) - Azithromycin 500 mg PO daily, then discharge on 250 mg for 3 days - Resume Xopenex, Dulera, and Spiriva and follow up in outpatient clinic for COPD management  Elevated LFTs and Alkaline Phosphatase: [name] a history of chronically elevated LFTs, likely secondary to her alcohol use. She drinks two 24 oz beers daily and three on weekends and her AST:ALT is elevated in the classic 2:1 ratio. Her alkaline phosphatase is elevated to 345, however, per chart review this is also chronic and was 379 as early as 2014. A GGT performed 01/2015 was elevated to 552 supporting a hepatobiliary cause of the alkaline phosphatase elevation. An abdominal ultrasound performed 05/2004 showed diffuse fatty infiltration of the liver as well  as cholelithiasis and sludge in gallbladder fundus. A repeat ultrasound was considered 01/2015, but the patient was lost to follow-up. - Follow up outpatient for abdominal ultrasound to further explore chronically elevated LFTs/Alk Phos  Alcohol Use Disorder: Last drink per patient report was night of 11/7. EtOH negative in emergency department. Patient reports 48 oz of beer daily, 72 oz daily on weekends.  - CIWA protocol  Cachexia:  Patient is extremely thin appearing with unknown diet at home, although reports 2-3 meals per day. Differential diagnoses include COPD vs. Alcohol use disorder with poor dietary intake vs. Occult malignancy with smoking history and remote history of breast cancer s/p mastectomy. Patient denies weight loss ("I've always been skinny"), fevers, and night sweats. MCV 104 supports dietary cause with B12/folate deficiency. - Thiamine 100 mg PO daily - Folate 1 mg PO daily - Multivitamin tablet daily - Ensure supplement BID  DVT ppx: Lovenox 20 mg [place] daily  Dispo: Home today  Code Status: FULL CODE  This is a Location manager Note.  The care of the patient was discussed with Dr. Wynetta Emery and the assessment and plan formulated with their assistance.  Please see their attached note for official documentation of the daily encounter.   LOS: 1 day   Lawson Fiscal, Medical Student 03/26/2016, 8:54 AM

## 2016-03-30 ENCOUNTER — Encounter: Payer: Self-pay | Admitting: Internal Medicine

## 2016-03-30 ENCOUNTER — Telehealth: Payer: Self-pay | Admitting: *Deleted

## 2016-03-30 ENCOUNTER — Ambulatory Visit: Payer: Medicare Other

## 2016-03-30 NOTE — Telephone Encounter (Addendum)
Information was sent to CoverMyMeds for Prior Authorization review for [facility].  Response I 72 hours.  Sander Nephew, RN 03/30/2016 11:05 AM  Fax from [employer] was approved through 05/17/2017.  [place] was

## 2016-03-30 NOTE — Telephone Encounter (Signed)
When I called a man stated I had the wrong #

## 2016-04-02 DIAGNOSIS — J449 Chronic obstructive pulmonary disease, unspecified: Secondary | ICD-10-CM | POA: Diagnosis not present

## 2016-04-06 ENCOUNTER — Telehealth: Payer: Self-pay

## 2016-04-07 ENCOUNTER — Ambulatory Visit: Payer: Medicare Other | Admitting: Pharmacist

## 2016-04-13 ENCOUNTER — Ambulatory Visit: Payer: Medicare Other

## 2016-04-20 ENCOUNTER — Encounter: Payer: Self-pay | Admitting: Licensed Clinical Social Worker

## 2016-04-20 ENCOUNTER — Other Ambulatory Visit: Payer: Self-pay | Admitting: Licensed Clinical Social Worker

## 2016-04-20 ENCOUNTER — Ambulatory Visit (INDEPENDENT_AMBULATORY_CARE_PROVIDER_SITE_OTHER): Payer: Medicare Other | Admitting: Internal Medicine

## 2016-04-20 VITALS — BP 116/71 | HR 95 | Temp 97.5°F | Ht 64.0 in | Wt 75.4 lb

## 2016-04-20 DIAGNOSIS — Z23 Encounter for immunization: Secondary | ICD-10-CM | POA: Diagnosis not present

## 2016-04-20 DIAGNOSIS — Z5189 Encounter for other specified aftercare: Secondary | ICD-10-CM | POA: Diagnosis not present

## 2016-04-20 DIAGNOSIS — F1020 Alcohol dependence, uncomplicated: Secondary | ICD-10-CM

## 2016-04-20 DIAGNOSIS — J438 Other emphysema: Secondary | ICD-10-CM

## 2016-04-20 DIAGNOSIS — J449 Chronic obstructive pulmonary disease, unspecified: Secondary | ICD-10-CM

## 2016-04-20 DIAGNOSIS — E559 Vitamin D deficiency, unspecified: Secondary | ICD-10-CM | POA: Diagnosis not present

## 2016-04-20 DIAGNOSIS — D539 Nutritional anemia, unspecified: Secondary | ICD-10-CM | POA: Diagnosis not present

## 2016-04-20 DIAGNOSIS — Z681 Body mass index (BMI) 19 or less, adult: Secondary | ICD-10-CM

## 2016-04-20 DIAGNOSIS — R64 Cachexia: Secondary | ICD-10-CM

## 2016-04-20 DIAGNOSIS — F1010 Alcohol abuse, uncomplicated: Secondary | ICD-10-CM

## 2016-04-20 DIAGNOSIS — F17210 Nicotine dependence, cigarettes, uncomplicated: Secondary | ICD-10-CM

## 2016-04-20 MED ORDER — ENSURE COMPLETE SHAKE PO LIQD: 1.0000 | Freq: Two times a day (BID) | ORAL | 3 refills | Status: DC

## 2016-04-20 MED ORDER — VITAMIN D (ERGOCALCIFEROL) 1.25 MG (50000 UNIT) PO CAPS: 50000.0000 [IU] | ORAL_CAPSULE | ORAL | 1 refills | Status: AC

## 2016-04-20 NOTE — Assessment & Plan Note (Addendum)
Severely cachectic and malnourished, BMI under 13, likely secondary to severe COPD and ongoing alcoholism (reports drinking 48 oz beer daily). Weight was nearly 100 lbs in 2016 but now she is 75 lbs, which she is surprised to learn. She reports having a good appetite and describes nutritional meals (pork chops, macaroni, fresh veggies) but no family is present to corroborate this. Denies abdominal pain or fullness with eating, or diarrhea/constipation. Reports living with son and relying on him to shop for groceries. States that she has tried Ensure in the past but did not like the chocolate or vanilla flavors. She would benefit from home visits for observation to gauge her nutritional intake and food availability, alcohol intake, and medication compliance.  Plan: - Provided information about meals on wheels - [facility] referral - Refilled Ensure nutritional supplement prescription - Encouraged increased po intake.  - Follow up in 1 month

## 2016-04-20 NOTE — Assessment & Plan Note (Signed)
Reports continues to drink approx 48 oz beer daily, unsure of accuracy of this. Patient is very thin and cachectic with multiple vitamin deficiencies (vitamin D, likely folate/B12 with her macrocytic anemia). Would benefit from home visits for observation, brief assessment to gauge level of alcohol consumption and nutritional intake/availability.   Plan: - Encouraged cutting down on alcohol / cessation - Abnormal CMP with changes consistent with chronic alcoholism at recent hospitalization, recheck in 6 months  CMP Latest Ref Rng & Units 03/26/2016 03/25/2016 02/05/2015  Glucose 65 - 99 mg/dL 146(H) 90 83  BUN 6 - 20 mg/dL 5(L) <5(L) 4(L)  Creatinine 0.44 - 1.00 mg/dL 0.67 0.69 0.57  Sodium 135 - 145 mmol/L 128(L) 132(L) 132(L)  Potassium 3.5 - 5.1 mmol/L 3.4(L) 4.2 4.6  Chloride 101 - 111 mmol/L 92(L) 94(L) 91(L)  CO2 22 - 32 mmol/L 27 28 23   Calcium 8.9 - 10.3 mg/dL 8.6(L) 8.9 9.3  Total Protein 6.5 - 8.1 g/dL - 8.6(H) 7.8  Total Bilirubin 0.3 - 1.2 mg/dL - 1.9(H) 0.6  Alkaline Phos 38 - 126 U/L - 345(H) 366(H)  AST 15 - 41 U/L - 75(H) 57(H)  ALT 14 - 54 U/L - 42 22

## 2016-04-20 NOTE — Progress Notes (Signed)
Kayla Tyler was referred to CSW for community care management and mobile meals information.  Pt did not voice interest in substance abuse services during [facility] appointment.  CSW met with Kayla Tyler following her scheduled [facility] appointment.  Per patient request, CSW provided information on [facility], community nutrition and mobile meals.  Physician requesting referral for community care management for nutrition and chronic disease management.  Chart indicates pt may be eligible for [facility] or [facility].  CSW discussed referral to [facility], pt agreeable.  CSW confirmed address and phone number.  Pt also made aware of RD available at [facility], pt prefers community resources.  Referral to [facility], if pt is not eligible for [facility] will refer to [facility].

## 2016-04-20 NOTE — Assessment & Plan Note (Signed)
Vitamin D (25-hydroxy) (LL) < 4 ng/mL when checked in 01/2015, has not received any supplementation and has poor nutritional status.   Plan: - Supplement vitamin D - ergocalciferol 50,000 units Q 7 days

## 2016-04-20 NOTE — Patient Instructions (Addendum)
Please continue to take your inhalers and medications as prescribed.  You have lost a great deal of weight, it is important for you to eat plenty of food throughout the day and to gain some weight. If you can try some nutritional supplemental shakes - as these really help you gain weight fast.  We have provided information on home visits by [facility] staff, as well as meals on wheels.  Please cut down on smoking and drinking as much as you can.  Please return in about 1 month to follow up your COPD and nutrition.

## 2016-04-20 NOTE — Assessment & Plan Note (Signed)
Recent 1-day hospitalization 11/8-11/9 for COPD exacerbation with increased dyspnea at home - found to have not been taking any of her COPD inhalers for the last year, due to thinking that she no longer needed them. Completed course of Azithromycin and Prednisone and resume Spiriva QD, Dulera BID, Levalbuterol PRN and is no longer experiencing dyspnea. She reports being fully functional in ADLs at home, she has been able to cut down minimally on smoking - now just under 1/2 ppd. Cachectic with poor air movement bilaterally on exam.  Plan: - Continue Spiriva, Dulera, Levalbuterol as prescribed - Encouraged smoking cessation, not interested in patch or other medications at this time - Provided PCV13 vaccine today

## 2016-04-20 NOTE — Progress Notes (Signed)
   CC: Hospital follow up, COPD  HPI:  [name] is a 70 y.o. female with PMHx detailed below presenting for follow up for recent hospitalization for COPD exacerbation. Her breathing is much improved after restarting her inhalers.   See problem based assessment and plan below for additional details.  Past Medical History:  Diagnosis Date  . Breast cancer ([place])   . COPD (chronic obstructive pulmonary disease) ([place])   . Hypertension     Review of Systems: Review of Systems  Constitutional: Positive for weight loss. Negative for chills, diaphoresis, fever and malaise/fatigue.  Eyes: Negative for blurred vision.  Respiratory: Positive for cough, sputum production and wheezing. Negative for shortness of breath.   Cardiovascular: Negative for chest pain, palpitations and orthopnea.  Gastrointestinal: Negative for abdominal pain, constipation, diarrhea, heartburn, nausea and vomiting.  Skin: Negative for itching and rash.  Psychiatric/Behavioral: Negative for depression. The patient is not nervous/anxious.      Physical Exam: Vitals:   04/20/16 1058  BP: 116/71  Pulse: 95  Temp: 97.5 F (36.4 C)  TempSrc: Oral  SpO2: 99%  Weight: 75 lb 6.4 oz (34.2 kg)  Height: 5\' 4"  (1.626 m)   Body mass index is 12.94 kg/m.   GENERAL- Cachectic AA woman sitting comfortably in exam room chair, alert, in no distress HEENT- Atraumatic, PERRL, EOMI, moist mucous membranes, upper dentures in place, oropharynx clear CARDIAC- Regular rate and rhythm, no murmurs, rubs or gallops. RESP- Decreased breath sounds bilaterally, no wheezing or crackles, normal work of breathing ABDOMEN- Soft, scaphoid appearance, nontender, nondistended BACK- Kyphotic curvature, no paraspinal tenderness EXTREMITIES- Thin bulk and normal range of motion, no edema, 2+ peripheral pulses SKIN- Warm, dry, intact, without visible rash PSYCH- Appropriate affect, clear speech, thoughts linear and  goal-directed  Assessment & Plan:   See encounters tab for problem based medical decision making.  Patient seen with Dr. Lynnae Tyler

## 2016-04-21 ENCOUNTER — Other Ambulatory Visit: Payer: Self-pay

## 2016-04-21 NOTE — Patient Outreach (Signed)
[facility] [facility]) Care Management  04/21/2016  TRISHANA HANAN 1946-03-18 [id number]      Telephone Screen  Referral Date: 04/20/16 Referral Source: MD office([facility]) Referral Reason: "home visits for observation to gauge nutritional intake, food availability, alcohol intake, medication compliance, COPD, PNA"    Outreach attempt #1 to patient. No answer at present and unable to leave message.      Plan: RN CM will make outreach attempt to patient within a week.   Enzo Montgomery, RN,BSN,CCM [place] Management Telephonic Care Management Coordinator Direct Phone: [phone] Toll Free: [phone] Fax: [phone]

## 2016-04-21 NOTE — Progress Notes (Signed)
Internal Medicine Clinic Attending  I saw and evaluated the patient.  I personally confirmed the key portions of the history and exam documented by Dr. Johnson and I reviewed pertinent patient test results.  The assessment, diagnosis, and plan were formulated together and I agree with the documentation in the resident's note.  

## 2016-04-23 ENCOUNTER — Other Ambulatory Visit: Payer: Self-pay

## 2016-04-23 NOTE — Patient Outreach (Signed)
[facility]) Care Management  04/23/2016  Kayla Tyler [birth year] [id number]   Telephone Screen  Referral Date: 04/20/16 Referral Source: MD office([facility]) Referral Reason: "home visits for observation to gauge nutritional intake, food availability, alcohol intake, medication compliance, COPD, PNA"   Outreach attempt #2 to patient. No answer and unable to leave message. RN CM attempted patient's emergency contact/dtr-[name]. No answer at that number as well.     Plan: RN CM will make outreach attempt to patient within a week.  Enzo Montgomery, RN,BSN,CCM [place] Management Telephonic Care Management Coordinator Direct Phone: [phone] Toll Free: [phone] Fax: [phone]

## 2016-04-27 ENCOUNTER — Other Ambulatory Visit: Payer: Self-pay

## 2016-04-27 NOTE — Patient Outreach (Signed)
[place] [facility]) Care Management  04/27/2016  Kayla Tyler [birth year] [id number]      Telephone Screen  Referral Date: 04/20/16 Referral Source: MD office([facility]) Referral Reason: "home visits for observation to gauge nutritional intake, food availability, alcohol intake, medication compliance, COPD, PNA"    Outreach attempt # 3 to patient. Female answered the phone and reported patient was not available at present.     Plan: RN CM will send unsuccessful outreach letter to patient and close case if no response from patient within 10 business days.   Enzo Montgomery, RN,BSN,CCM [place] Management Telephonic Care Management Coordinator Direct Phone: [phone] Toll Free: [phone] Fax: [phone]'

## 2016-04-28 NOTE — Telephone Encounter (Signed)
Patient was contacted with Darcella Cheshire, PharmD candidate.

## 2016-05-02 DIAGNOSIS — J449 Chronic obstructive pulmonary disease, unspecified: Secondary | ICD-10-CM | POA: Diagnosis not present

## 2016-05-13 ENCOUNTER — Other Ambulatory Visit: Payer: Self-pay

## 2016-05-13 NOTE — Patient Outreach (Signed)
[facility]) Care Management  05/13/2016  MARNITA CAULFIELD 01/30/1946 [id number]      Telephone Screen  Referral Date: 04/20/16 Referral Source: MD office([facility]) Referral Reason: "home visits for observation to gauge nutritional intake, food availability, alcohol intake, medication compliance, COPD, PNA"   Multiple attempts to establish contact with patient without success. No response from patient from letter mailed to her. Case is being closed at this time.     Plan: RN CM will notify [facility] administrative assistant of case closure.  RN CM will send MD case closure letter.    Enzo Montgomery, RN,BSN,CCM [place] Management Telephonic Care Management Coordinator Direct Phone: [phone] Toll Free: [phone] Fax: [phone]

## 2016-06-02 DIAGNOSIS — J449 Chronic obstructive pulmonary disease, unspecified: Secondary | ICD-10-CM | POA: Diagnosis not present

## 2016-07-03 DIAGNOSIS — J449 Chronic obstructive pulmonary disease, unspecified: Secondary | ICD-10-CM | POA: Diagnosis not present

## 2016-07-31 DIAGNOSIS — J449 Chronic obstructive pulmonary disease, unspecified: Secondary | ICD-10-CM | POA: Diagnosis not present

## 2016-08-28 ENCOUNTER — Telehealth: Payer: Self-pay | Admitting: Internal Medicine

## 2016-08-28 NOTE — Telephone Encounter (Signed)
Can patient get an appointment with me? Have not seen her since 2016. This is definitely concerning. I can see her in [facility] if needed.

## 2016-08-28 NOTE — Telephone Encounter (Signed)
Calling to speak with nurse to report findings please call.

## 2016-08-28 NOTE — Telephone Encounter (Signed)
Called pt - informed she has not seen her PCP over 1 yr and can we schedule an appt. Appt scheduled in [facility] on the 24th.

## 2016-08-28 NOTE — Telephone Encounter (Signed)
Return call to [facility] - states pt weight is 80 lbs and pt had told her she weighed 120 lb at last office visit and she eats  2 meals a day. Last OV in December, pt weighed 75 lbs. Informed I will relay message to PCP.

## 2016-08-31 DIAGNOSIS — J449 Chronic obstructive pulmonary disease, unspecified: Secondary | ICD-10-CM | POA: Diagnosis not present

## 2016-09-08 ENCOUNTER — Other Ambulatory Visit: Payer: Self-pay | Admitting: Internal Medicine

## 2016-09-08 ENCOUNTER — Ambulatory Visit: Payer: Medicare Other | Admitting: Internal Medicine

## 2016-09-09 ENCOUNTER — Telehealth: Payer: Self-pay | Admitting: Internal Medicine

## 2016-09-09 NOTE — Telephone Encounter (Signed)
APT. REMINDER CALL, NO ANSWER, NO VOICEMAIL °

## 2016-09-10 ENCOUNTER — Ambulatory Visit: Payer: Medicare Other

## 2016-09-30 DIAGNOSIS — J449 Chronic obstructive pulmonary disease, unspecified: Secondary | ICD-10-CM | POA: Diagnosis not present

## 2016-10-13 ENCOUNTER — Encounter: Payer: Medicare Other | Admitting: Internal Medicine

## 2016-10-31 DIAGNOSIS — J449 Chronic obstructive pulmonary disease, unspecified: Secondary | ICD-10-CM | POA: Diagnosis not present

## 2016-11-18 ENCOUNTER — Emergency Department (HOSPITAL_COMMUNITY): Payer: Medicare Other

## 2016-11-18 ENCOUNTER — Encounter (HOSPITAL_COMMUNITY): Payer: Self-pay

## 2016-11-18 ENCOUNTER — Inpatient Hospital Stay (HOSPITAL_COMMUNITY)
Admission: EM | Admit: 2016-11-18 | Discharge: 2016-11-22 | DRG: 640 | Disposition: A | Discharge status: Home / Self Care | Payer: Medicare Other | Attending: Internal Medicine | Admitting: Internal Medicine

## 2016-11-18 DIAGNOSIS — Z681 Body mass index (BMI) 19 or less, adult: Secondary | ICD-10-CM | POA: Diagnosis not present

## 2016-11-18 DIAGNOSIS — R000 Tachycardia, unspecified: Secondary | ICD-10-CM | POA: Diagnosis present

## 2016-11-18 DIAGNOSIS — Z880 Allergy status to penicillin: Secondary | ICD-10-CM

## 2016-11-18 DIAGNOSIS — Z7951 Long term (current) use of inhaled steroids: Secondary | ICD-10-CM | POA: Diagnosis not present

## 2016-11-18 DIAGNOSIS — Z8249 Family history of ischemic heart disease and other diseases of the circulatory system: Secondary | ICD-10-CM | POA: Diagnosis not present

## 2016-11-18 DIAGNOSIS — F17210 Nicotine dependence, cigarettes, uncomplicated: Secondary | ICD-10-CM | POA: Diagnosis present

## 2016-11-18 DIAGNOSIS — E785 Hyperlipidemia, unspecified: Secondary | ICD-10-CM | POA: Diagnosis not present

## 2016-11-18 DIAGNOSIS — I10 Essential (primary) hypertension: Secondary | ICD-10-CM | POA: Diagnosis not present

## 2016-11-18 DIAGNOSIS — Z833 Family history of diabetes mellitus: Secondary | ICD-10-CM

## 2016-11-18 DIAGNOSIS — I700 Atherosclerosis of aorta: Secondary | ICD-10-CM | POA: Diagnosis not present

## 2016-11-18 DIAGNOSIS — E861 Hypovolemia: Secondary | ICD-10-CM | POA: Diagnosis present

## 2016-11-18 DIAGNOSIS — Z853 Personal history of malignant neoplasm of breast: Secondary | ICD-10-CM

## 2016-11-18 DIAGNOSIS — E871 Hypo-osmolality and hyponatremia: Principal | ICD-10-CM | POA: Diagnosis present

## 2016-11-18 DIAGNOSIS — Y906 Blood alcohol level of 120-199 mg/100 ml: Secondary | ICD-10-CM | POA: Diagnosis present

## 2016-11-18 DIAGNOSIS — J441 Chronic obstructive pulmonary disease with (acute) exacerbation: Secondary | ICD-10-CM | POA: Diagnosis present

## 2016-11-18 DIAGNOSIS — Z823 Family history of stroke: Secondary | ICD-10-CM

## 2016-11-18 DIAGNOSIS — Z79899 Other long term (current) drug therapy: Secondary | ICD-10-CM

## 2016-11-18 DIAGNOSIS — Z888 Allergy status to other drugs, medicaments and biological substances status: Secondary | ICD-10-CM | POA: Diagnosis not present

## 2016-11-18 DIAGNOSIS — F1010 Alcohol abuse, uncomplicated: Secondary | ICD-10-CM | POA: Diagnosis present

## 2016-11-18 DIAGNOSIS — Z9012 Acquired absence of left breast and nipple: Secondary | ICD-10-CM

## 2016-11-18 DIAGNOSIS — E43 Unspecified severe protein-calorie malnutrition: Secondary | ICD-10-CM | POA: Diagnosis not present

## 2016-11-18 DIAGNOSIS — R296 Repeated falls: Secondary | ICD-10-CM | POA: Diagnosis not present

## 2016-11-18 DIAGNOSIS — R404 Transient alteration of awareness: Secondary | ICD-10-CM | POA: Diagnosis not present

## 2016-11-18 DIAGNOSIS — F17200 Nicotine dependence, unspecified, uncomplicated: Secondary | ICD-10-CM | POA: Diagnosis present

## 2016-11-18 DIAGNOSIS — R0602 Shortness of breath: Secondary | ICD-10-CM | POA: Diagnosis not present

## 2016-11-18 DIAGNOSIS — J449 Chronic obstructive pulmonary disease, unspecified: Secondary | ICD-10-CM | POA: Diagnosis present

## 2016-11-18 HISTORY — DX: Tobacco use: Z72.0

## 2016-11-18 HISTORY — DX: Alcohol abuse, uncomplicated: F10.10

## 2016-11-18 LAB — COMPREHENSIVE METABOLIC PANEL
ALBUMIN: 2.9 g/dL — AB (ref 3.5–5.0)
ALK PHOS: 226 U/L — AB (ref 38–126)
ALT: 32 U/L (ref 14–54)
ANION GAP: 11 (ref 5–15)
AST: 45 U/L — ABNORMAL HIGH (ref 15–41)
BUN: <5 mg/dL — ABNORMAL LOW (ref 6–20)
CALCIUM: 8.2 mg/dL — AB (ref 8.9–10.3)
CO2: 24 mmol/L (ref 22–32)
CREATININE: 0.53 mg/dL (ref 0.44–1.00)
Chloride: 75 mmol/L — ABNORMAL LOW (ref 101–111)
GFR calc non Af Amer: >60 mL/min (ref >60)
GLUCOSE: 104 mg/dL — AB (ref 65–99)
Potassium: 3.6 mmol/L (ref 3.5–5.1)
SODIUM: 110 mmol/L — AB (ref 135–145)
Total Bilirubin: 1.4 mg/dL — ABNORMAL HIGH (ref 0.3–1.2)
Total Protein: 8.2 g/dL — ABNORMAL HIGH (ref 6.5–8.1)

## 2016-11-18 LAB — CBC WITH DIFFERENTIAL/PLATELET
BASOS ABS: 0 10*3/uL (ref 0.0–0.1)
BASOS PCT: 0 %
EOS ABS: 0 10*3/uL (ref 0.0–0.7)
Eosinophils Relative: 0 %
HCT: 36.1 % (ref 36.0–46.0)
HEMOGLOBIN: 13.3 g/dL (ref 12.0–15.0)
Lymphocytes Relative: 21 %
Lymphs Abs: 1.6 10*3/uL (ref 0.7–4.0)
MCH: 35.7 pg — ABNORMAL HIGH (ref 26.0–34.0)
MCHC: 36.8 g/dL — ABNORMAL HIGH (ref 30.0–36.0)
MCV: 96.8 fL (ref 78.0–100.0)
Monocytes Absolute: 0.4 10*3/uL (ref 0.1–1.0)
Monocytes Relative: 6 %
NEUTROS PCT: 74 %
Neutro Abs: 5.6 10*3/uL (ref 1.7–7.7)
Platelets: 156 10*3/uL (ref 150–400)
RBC: 3.73 MIL/uL — AB (ref 3.87–5.11)
RDW: 11.7 % (ref 11.5–15.5)
WBC: 7.6 10*3/uL (ref 4.0–10.5)

## 2016-11-18 LAB — I-STAT VENOUS BLOOD GAS, ED
ACID-BASE EXCESS: 2 mmol/L (ref 0.0–2.0)
Bicarbonate: 25.8 mmol/L (ref 20.0–28.0)
O2 SAT: 32 %
PH VEN: 7.457 — AB (ref 7.250–7.430)
TCO2: 27 mmol/L (ref 0–100)
pCO2, Ven: 36.5 mmHg — ABNORMAL LOW (ref 44.0–60.0)
pO2, Ven: 19 mmHg — CL (ref 32.0–45.0)

## 2016-11-18 LAB — URINALYSIS, ROUTINE W REFLEX MICROSCOPIC
Bacteria, UA: NONE SEEN
Bilirubin Urine: NEGATIVE
GLUCOSE, UA: NEGATIVE mg/dL
KETONES UR: NEGATIVE mg/dL
LEUKOCYTES UA: NEGATIVE
Nitrite: NEGATIVE
PROTEIN: NEGATIVE mg/dL
Specific Gravity, Urine: 1.006 (ref 1.005–1.030)
Squamous Epithelial / LPF: NONE SEEN
pH: 6 (ref 5.0–8.0)

## 2016-11-18 LAB — OSMOLALITY, URINE: OSMOLALITY UR: 233 mosm/kg — AB (ref 300–900)

## 2016-11-18 LAB — I-STAT TROPONIN, ED: Troponin i, poc: 0 ng/mL (ref 0.00–0.08)

## 2016-11-18 LAB — SODIUM, URINE, RANDOM: SODIUM UR: 38 mmol/L

## 2016-11-18 MED ORDER — SODIUM CHLORIDE 0.9 % IV SOLN
INTRAVENOUS | Status: DC
  Administered 2016-11-19: 01:00:00 via INTRAVENOUS

## 2016-11-18 MED ORDER — SODIUM CHLORIDE 0.9 % IV SOLN: Freq: Once | INTRAVENOUS | Status: DC

## 2016-11-18 MED ORDER — ADULT MULTIVITAMIN W/MINERALS CH
1.0000 | ORAL_TABLET | Freq: Every day | ORAL | Status: DC
  Administered 2016-11-19 – 2016-11-22 (×4): 1 via ORAL
  Filled 2016-11-18 (×4): qty 1

## 2016-11-18 MED ORDER — IPRATROPIUM-ALBUTEROL 0.5-2.5 (3) MG/3ML IN SOLN
3.0000 mL | Freq: Once | RESPIRATORY_TRACT | Status: AC
  Administered 2016-11-18: 3 mL via RESPIRATORY_TRACT
  Filled 2016-11-18: qty 3

## 2016-11-18 MED ORDER — FOLIC ACID 1 MG PO TABS
1.0000 mg | ORAL_TABLET | Freq: Every day | ORAL | Status: DC
  Administered 2016-11-19 – 2016-11-22 (×4): 1 mg via ORAL
  Filled 2016-11-18 (×4): qty 1

## 2016-11-18 MED ORDER — THIAMINE HCL 100 MG/ML IJ SOLN: 100.0000 mg | Freq: Every day | INTRAMUSCULAR | Status: DC

## 2016-11-18 MED ORDER — METHYLPREDNISOLONE SODIUM SUCC 125 MG IJ SOLR
125.0000 mg | Freq: Once | INTRAMUSCULAR | Status: AC
  Administered 2016-11-18: 125 mg via INTRAVENOUS
  Filled 2016-11-18: qty 2

## 2016-11-18 MED ORDER — VITAMIN B-1 100 MG PO TABS
100.0000 mg | ORAL_TABLET | Freq: Every day | ORAL | Status: DC
  Administered 2016-11-19 – 2016-11-22 (×4): 100 mg via ORAL
  Filled 2016-11-18 (×4): qty 1

## 2016-11-18 MED ORDER — NICOTINE 14 MG/24HR TD PT24
14.0000 mg | MEDICATED_PATCH | Freq: Every day | TRANSDERMAL | Status: DC
  Administered 2016-11-19 – 2016-11-22 (×5): 14 mg via TRANSDERMAL
  Filled 2016-11-18 (×5): qty 1

## 2016-11-18 MED ORDER — SODIUM CHLORIDE 0.9 % IV BOLUS (SEPSIS)
500.0000 mL | Freq: Once | INTRAVENOUS | Status: AC
  Administered 2016-11-18: 500 mL via INTRAVENOUS

## 2016-11-18 NOTE — ED Provider Notes (Signed)
[facility] DEPT Provider Note   CSN: [id number] Arrival date & time: 11/18/16  2004     History   Chief Complaint Chief Complaint  Patient presents with  . Shortness of Breath    HPI Armanie MACAELA PRESAS is a 71 y.o. female.  HPI 71 year old Female who presents with shortness of breath. Patient is only able to provide limited history due to mild confusion. Patient's son called EMS today and reports that she had shortness of breath that has been progressively worsening for the past 2-3 days. She has been unable to ambulate or use the bathroom on her own. On EMS arrival she was noted have wheezing primarily on the right side. She was given 5 mg of albuterol, with improvement in her wheezing. Patient endorses cough with sputum. Denies any fevers, chest pain, abdominal pain.  She has a history of COPD, hypertension, tobacco use, and breast cancer s/p mastectomy, in remission.  Past Medical History:  Diagnosis Date  . Breast cancer ([place])   . COPD (chronic obstructive pulmonary disease) ([place])   . Hypertension     Patient Active Problem List   Diagnosis Date Noted  . Vitamin D deficiency 04/20/2016  . Aortic atherosclerosis ([place]) 03/26/2016  . Body mass index (BMI) 19.9 or less, adult 03/26/2016  . Preventative health care 02/05/2015  . Postmenopausal estrogen deficiency 02/05/2015  . Tobacco use disorder 08/21/2014  . Elevated LFTs 08/21/2014  . Hyponatremia 08/12/2014  . Essential hypertension 06/22/2014  . Excessive drinking alcohol 04/30/2013  . COPD (chronic obstructive pulmonary disease) ([place]) 04/30/2013  . History of breast cancer 04/30/2013  . Hyperlipidemia 07/15/2006    Past Surgical History:  Procedure Laterality Date  . MASTECTOMY Left     OB History    No data available       Home Medications    Prior to Admission medications   Medication Sig Start Date End Date Taking? Authorizing Provider  mometasone-formoterol (DULERA) 200-5 MCG/ACT AERO Inhale 2  puffs into the lungs 2 (two) times daily. 03/26/16  Yes Burgess Estelle, MD  tiotropium (SPIRIVA HANDIHALER) 18 MCG inhalation capsule Place 1 capsule (18 mcg total) into inhaler and inhale daily. 03/26/16  Yes Burgess Estelle, MD  folic acid (FOLVITE) 1 MG tablet Take 1 tablet (1 mg total) by mouth daily. Patient not taking: Reported on 03/25/2016 02/05/15   Rivet, Sindy Guadeloupe, MD  levalbuterol [facility] HFA) 45 MCG/ACT inhaler Inhale 2 puffs into the lungs every 4 (four) hours as needed for wheezing. Patient not taking: Reported on 03/25/2016 02/05/15   Rivet, Sindy Guadeloupe, MD  Nutritional Supplements (ENSURE COMPLETE SHAKE) LIQD Take 1 Can by mouth 2 (two) times daily. 04/20/16   Asencion Partridge, MD  thiamine 100 MG tablet Take 1 tablet (100 mg total) by mouth daily. Patient not taking: Reported on 03/25/2016 02/05/15   Rivet, Sindy Guadeloupe, MD  Vitamin D, Ergocalciferol, (DRISDOL) 50000 units CAPS capsule Take 1 capsule (50,000 Units total) by mouth every 7 (seven) days. 04/20/16   Asencion Partridge, MD    Family History Family History  Problem Relation Age of Onset  . Heart attack Father 74  . Stroke Sister 3  . Diabetes Sister   . Hypertension Brother     Social History Social History  Substance Use Topics  . Smoking status: Current Every Day Smoker    Packs/day: 0.50    Years: 48.00    Types: Cigarettes  . Smokeless tobacco: Never Used     Comment: cutting back  3/4 pack. 1/2 pack  . Alcohol use 19.2 oz/week    32 Cans of beer per week     Allergies   Lisinopril and Penicillins   Review of Systems Review of Systems  Constitutional: Negative for fever.  Respiratory: Positive for cough and shortness of breath.   Cardiovascular: Negative for chest pain and leg swelling.  Gastrointestinal: Negative for abdominal pain.  All other systems reviewed and are negative.    Physical Exam Updated Vital Signs BP (!) 159/100   Pulse (!) 112   Resp (!) 31   Ht 5\' 2"  (1.575 m)   SpO2 100%   Physical  Exam Physical Exam  Nursing note and vitals reviewed. Constitutional: Frail and malnourished appearing, non-toxic, and in no mild respiratory distress Head: Normocephalic and atraumatic.  Mouth/Throat: Oropharynx is clear and dry mucous membranes.  Neck: Normal range of motion. Neck supple.  Cardiovascular: Tachycardic rate and regular rhythm.   no lower extremity edema Pulmonary/Chest: Tachypnea. Moderate air movement throughout, with mild expiratory wheeze on the right. Abdominal: Soft. There is no tenderness. There is no rebound and no guarding.  Musculoskeletal: Normal range of motion.  Neurological: Alert, no facial droop, fluent speech, moves all extremities symmetrically Skin: Skin is warm and dry.  Psychiatric: Cooperative   ED Treatments / Results  Labs (all labs ordered are listed, but only abnormal results are displayed) Labs Reviewed  CBC WITH DIFFERENTIAL/PLATELET - Abnormal; Notable for the following:       Result Value   RBC 3.73 (*)    MCH 35.7 (*)    MCHC 36.8 (*)    All other components within normal limits  COMPREHENSIVE METABOLIC PANEL - Abnormal; Notable for the following:    Sodium 110 (*)    Chloride 75 (*)    Glucose, Bld 104 (*)    BUN <5 (*)    Calcium 8.2 (*)    Total Protein 8.2 (*)    Albumin 2.9 (*)    AST 45 (*)    Alkaline Phosphatase 226 (*)    Total Bilirubin 1.4 (*)    All other components within normal limits  I-STAT VENOUS BLOOD GAS, ED - Abnormal; Notable for the following:    pH, Ven 7.457 (*)    pCO2, Ven 36.5 (*)    pO2, Ven 19.0 (*)    All other components within normal limits  URINALYSIS, ROUTINE W REFLEX MICROSCOPIC  OSMOLALITY  OSMOLALITY, URINE  SODIUM, URINE, RANDOM  I-STAT TROPOININ, ED    EKG  EKG Interpretation  Date/Time:  Wednesday November 18 2016 20:09:26 EDT Ventricular Rate:  112 PR Interval:    QRS Duration: 98 QT Interval:  325 QTC Calculation: 444 R Axis:   73 Text Interpretation:  Sinus tachycardia  Biatrial enlargement Artifact in lead(s) II III aVR aVF V1 V2 V3 V4 V5 Baseline wander TECHNICALLY DIFFICULT   Confirmed by Brantley Stage [phone]) on 11/18/2016 8:26:20 PM       Radiology Dg Chest 2 View  Result Date: 11/18/2016 CLINICAL DATA:  Acute onset of shortness of breath. Initial encounter. EXAM: CHEST  2 VIEW COMPARISON:  Chest radiograph performed 03/25/2016 FINDINGS: The lungs are well-aerated and clear. There is no evidence of focal opacification, pleural effusion or pneumothorax. The heart is normal in size; the mediastinal contour is within normal limits. No acute osseous abnormalities are seen. Clips are noted at the left axilla. IMPRESSION: No acute cardiopulmonary process seen. Electronically Signed   By: Francoise Schaumann.D.  On: 11/18/2016 21:15    Procedures Procedures (including critical care time) CRITICAL CARE Performed by: Forde Dandy   Total critical care time: 35 minutes  Critical care time was exclusive of separately billable procedures and treating other patients.  Critical care was necessary to treat or prevent imminent or life-threatening deterioration.  Critical care was time spent personally by me on the following activities: development of treatment plan with patient and/or surrogate as well as nursing, discussions with consultants, evaluation of patient's response to treatment, examination of patient, obtaining history from patient or surrogate, ordering and performing treatments and interventions, ordering and review of laboratory studies, ordering and review of radiographic studies, pulse oximetry and re-evaluation of patient's condition.  Medications Ordered in ED Medications  sodium chloride 0.9 % bolus 500 mL (500 mLs Intravenous New Bag/Given 11/18/16 2043)  methylPREDNISolone sodium succinate (SOLU-MEDROL) 125 mg/2 mL injection 125 mg (125 mg Intravenous Given 11/18/16 2043)  ipratropium-albuterol (DUONEB) 0.5-2.5 (3) MG/3ML nebulizer solution 3 mL (3 mLs  Nebulization Given 11/18/16 2134)     Initial Impression / Assessment and Plan / ED Course  I have reviewed the triage vital signs and the nursing notes.  Pertinent labs & imaging results that were available during my care of the patient were reviewed by me and considered in my medical decision making (see chart for details).     71 year old female who presents with 2-3 days of shortness of breath, with decreased activity and decreased by mouth intake. He received treatment for COPD exacerbation, and on arrival with mild wheezing and tachypnea. Given DuoNeb and Solu-Medrol, with improvement in her work of breathing. His x-ray is clear without acute cardiopulmonary processes. EKG and troponin are unremarkable.  She does have significant hyponatremia of 110. Suspect this is hypovolemia related given her decreased by mouth intake over the past 2-3 days. She did initially receive 500 mL bolus of IV fluids, placed on normal saline at 100 mL per hour. She is not obtunded, but does have mild disorientation to time and situation.  Discussed with internal medicine teaching service. She will be admited for ongoing management.  Final Clinical Impressions(s) / ED Diagnoses   Final diagnoses:  Hyponatremia  COPD exacerbation [facility])    New Prescriptions New Prescriptions   No medications on file     Forde Dandy, MD 11/18/16 2207

## 2016-11-18 NOTE — H&P (Signed)
Date: 11/18/2016               Patient Name:  Kayla Tyler MRN: [id number]  DOB: 1946-02-15 Age / Sex: 71 y.o., female   PCP: Tawny Asal, MD         Medical Service: Internal Medicine Teaching Service         Attending Physician: Dr. Oval Linsey, MD    First Contact: Dr. Berline Lopes  Pager: [phone]  Second Contact: Dr. Marlowe Sax  Pager: [phone]       After Hours (After 5p/  First Contact Pager: [phone]  weekends / holidays): Second Contact Pager: [phone]   Chief Complaint: Confusion   History of Present Illness: Kayla Tyler is a 71 y.o. female with history of alcohol abuse, COPD, HTN, ?liver cirrhosis, and breast cancer s/p mastectomy in 2007 (in remission) who presents to the ED with confusion. Patient's son and daughter brought her in and provided part of history as patient was altered. Per son, who lives with her at home, she has not been acting like herself for the past week and has had several falls. She has also been more withdrawn and her oral intake has decreased. Her son decided to bring her in today because he found her confused with food on the floor. He also reports the patient has been short of breath, though patient denies this.   She denies chest pain, increase cough or dyspnea, no abdominal pain, N/V, urinary symptoms, changes in bowel movements and lower extremity swelling.   In the ED she was afebrile, tachycardic 110, mildly hypertensive, and confused. Workup significant for Na 110, U sodium 38, and Uosm 233. She received a 500cc bolus NS. She also received duonebs x1 and solumedrol x1 due to concern for COPD exacerbation. EKG and I-stat troponin negative.   Of note, patient was admitted 2 years ago with a sodium of 113.   Meds:  Current Meds  Medication Sig  . mometasone-formoterol (DULERA) 200-5 MCG/ACT AERO Inhale 2 puffs into the lungs 2 (two) times daily.  Marland Kitchen tiotropium (SPIRIVA HANDIHALER) 18 MCG inhalation capsule Place 1 capsule (18 mcg total) into  inhaler and inhale daily.  . [DISCONTINUED] acetaminophen (TYLENOL) 500 MG tablet Take 500 mg by mouth every 6 (six) hours as needed for mild pain.    Allergies: Allergies as of 11/18/2016 - Review Complete 11/18/2016  Allergen Reaction Noted  . Lisinopril Swelling 08/12/2014  . Penicillins Other (See Comments) 04/30/2013   Past Medical History:  Diagnosis Date  . Breast cancer ([place])   . COPD (chronic obstructive pulmonary disease) ([place])   . Hypertension    Past Surgical History:  Procedure Laterality Date  . MASTECTOMY Left     Family History:  Family History  Problem Relation Age of Onset  . Heart attack Father 51  . Stroke Sister 73  . Diabetes Sister   . Hypertension Brother     Social History:  Lives at home with son. Smokes ~1pack cigarrettes per day. Drinks ~3 24oz beers per day and usually increases her intake during the holidays.   Review of Systems: A complete ROS was negative except as per HPI.   Physical Exam: Blood pressure (!) 150/93, pulse (!) 111, resp. rate (!) 25, height _0  (1.575 m), SpO2 99 %.  General: malnourished, cachectic, lying in bed in no acute distress, slightly somnolent throughout interview  HENT: NCAT, neck supple and FROM, OP clear without exudates or erythema, MMM, poor dentition  Eyes: anicteric sclera, PERRL Cardiac: tachycardic, regular rhythm, loud S1, nl S2, no murmurs, rubs or gallops, no JVD  Pulm: CTAB, no wheezes or crackles, no increased work of breathing  Abd: soft, NTND, bowel sounds present, no rebound tenderness Neuro: A&Ox2, CN II-XII intact, sensation intact in all four extremities, no motor deficits  Ext: warm and well perfused, no peripheral edema, 2+ DP pulses bilaterally  Derm: nl skin turgor  Psych: withdrawn, flat affect, answers questions appropriately    EKG: personally reviewed my interpretation is sinus tachycardia appreciated, difficult interpretation otherwise due to artifact   CXR: personally  reviewed my interpretation is patent airway, hyperinflated lungs without evidence of consolidations, opacities, effusions or edema   Assessment & Plan by Problem:  [name] s a 71 y.o. female with history of alcohol abuse, COPD, HTN, HLD, ?liver cirrhosis, and breast cancer s/p mastectomy in 2007 (in remission) who presents to the ED with confusion in the setting of hyponatremia (Na 110).  1. Euvolemic Hypotonic Hyponatremia: Similar episode in March 2016. Patient presented with confusion in the setting of Na 110. U Na 38, Uosm 233, and serum osm 256. Workup consistent with low solute intake. Likely a combination of tea and toast syndrome and beer potomania in the setting of severe malnourishment, recent decrease in  PO intake and increased alcohol intake. Unlikely to be secondary to SIADH (PMH of malignancy but in remission for 11 years), endocrinopathy or primary polydipsia.   - s/p 500cc bolus NS  - NS _0 /hr  - Na checks q4h  - Na goal 116-118 in the next 24hrs   2. Alcohol abuse:  - BAL 142 - Folate 49m daily + IV/PO thiamine 1035m+ MVI  - CIWA protocol with no ativan ordered   3. COPD:  - Will continue home dulera - Holding home spiriva   - Duonebs q6h PRN   4. Severe malnutrition: BMI 13  - Encourage PO intake  - Nutrition consult  - Supplemental shakes   5. HTN: chronic. Not on any antihypertensive medications at home.  - Will continue to monitor   6. Falls: hyponatremia vs. alcohol intoxication  - Fall precautions  - Care management consult, will likely need home health   7. Tobacco use:  - Nicotine patch 1469m- Encourage smoking cessation   IVF: NS _1 /hr Diet: regular  DVT ppx: SQ lovenox  Code status: Full code   Dispo: Admit patient to Inpatient with expected length of stay greater than 2 midnights.  Signed: IdaWelford RocheD  Internal Medicine PGY-1  P [id number], 11:16 PM

## 2016-11-18 NOTE — ED Notes (Signed)
Admitting doctor at bedside 

## 2016-11-18 NOTE — ED Triage Notes (Signed)
PT son called EMS because of Shortness of breath for 2-3 days. Son states that she is not at her baseline unable to walk or use the bathroom on her own. Ems arrived pt having some wheezing on right side ems gave 5mg  of abuterol. Pt is axo x2 to place and self.

## 2016-11-19 DIAGNOSIS — E43 Unspecified severe protein-calorie malnutrition: Secondary | ICD-10-CM

## 2016-11-19 DIAGNOSIS — E871 Hypo-osmolality and hyponatremia: Principal | ICD-10-CM

## 2016-11-19 DIAGNOSIS — F1010 Alcohol abuse, uncomplicated: Secondary | ICD-10-CM

## 2016-11-19 LAB — BASIC METABOLIC PANEL
ANION GAP: 12 (ref 5–15)
Anion gap: 12 (ref 5–15)
Anion gap: 11 (ref 5–15)
Anion gap: 9 (ref 5–15)
Anion gap: 13 (ref 5–15)
Anion gap: 8 (ref 5–15)
Anion gap: 6 (ref 5–15)
BUN: <5 mg/dL — ABNORMAL LOW (ref 6–20)
BUN: <5 mg/dL — ABNORMAL LOW (ref 6–20)
BUN: 7 mg/dL (ref 6–20)
BUN: 9 mg/dL (ref 6–20)
BUN: 10 mg/dL (ref 6–20)
CHLORIDE: 81 mmol/L — AB (ref 101–111)
CHLORIDE: 81 mmol/L — AB (ref 101–111)
CHLORIDE: 83 mmol/L — AB (ref 101–111)
CHLORIDE: 79 mmol/L — AB (ref 101–111)
CHLORIDE: 81 mmol/L — AB (ref 101–111)
CHLORIDE: 81 mmol/L — AB (ref 101–111)
CO2: 18 mmol/L — ABNORMAL LOW (ref 22–32)
CO2: 21 mmol/L — ABNORMAL LOW (ref 22–32)
CO2: 24 mmol/L (ref 22–32)
CO2: 24 mmol/L (ref 22–32)
CO2: 24 mmol/L (ref 22–32)
CO2: 25 mmol/L (ref 22–32)
CO2: 25 mmol/L (ref 22–32)
CREATININE: 0.49 mg/dL (ref 0.44–1.00)
CREATININE: 0.55 mg/dL (ref 0.44–1.00)
CREATININE: 0.66 mg/dL (ref 0.44–1.00)
CREATININE: 0.64 mg/dL (ref 0.44–1.00)
CREATININE: 0.59 mg/dL (ref 0.44–1.00)
Calcium: 7.9 mg/dL — ABNORMAL LOW (ref 8.9–10.3)
Calcium: 7.9 mg/dL — ABNORMAL LOW (ref 8.9–10.3)
Calcium: 7.9 mg/dL — ABNORMAL LOW (ref 8.9–10.3)
Calcium: 8.2 mg/dL — ABNORMAL LOW (ref 8.9–10.3)
Calcium: 8.3 mg/dL — ABNORMAL LOW (ref 8.9–10.3)
Calcium: 8.4 mg/dL — ABNORMAL LOW (ref 8.9–10.3)
Calcium: 8.4 mg/dL — ABNORMAL LOW (ref 8.9–10.3)
Chloride: 82 mmol/L — ABNORMAL LOW (ref 101–111)
Creatinine, Ser: 0.4 mg/dL — ABNORMAL LOW (ref 0.44–1.00)
Creatinine, Ser: 0.51 mg/dL (ref 0.44–1.00)
GFR calc Af Amer: >60 mL/min (ref >60)
GFR calc Af Amer: >60 mL/min (ref >60)
GFR calc non Af Amer: >60 mL/min (ref >60)
GFR calc non Af Amer: >60 mL/min (ref >60)
GFR calc non Af Amer: >60 mL/min (ref >60)
Glucose, Bld: 103 mg/dL — ABNORMAL HIGH (ref 65–99)
Glucose, Bld: 108 mg/dL — ABNORMAL HIGH (ref 65–99)
Glucose, Bld: 110 mg/dL — ABNORMAL HIGH (ref 65–99)
Glucose, Bld: 131 mg/dL — ABNORMAL HIGH (ref 65–99)
Glucose, Bld: 143 mg/dL — ABNORMAL HIGH (ref 65–99)
Glucose, Bld: 193 mg/dL — ABNORMAL HIGH (ref 65–99)
Glucose, Bld: 75 mg/dL (ref 65–99)
POTASSIUM: 4 mmol/L (ref 3.5–5.1)
POTASSIUM: 4.1 mmol/L (ref 3.5–5.1)
POTASSIUM: 4.1 mmol/L (ref 3.5–5.1)
POTASSIUM: 3.3 mmol/L — AB (ref 3.5–5.1)
POTASSIUM: 3.8 mmol/L (ref 3.5–5.1)
POTASSIUM: 3.9 mmol/L (ref 3.5–5.1)
POTASSIUM: 4.3 mmol/L (ref 3.5–5.1)
SODIUM: 117 mmol/L — AB (ref 135–145)
SODIUM: 115 mmol/L — AB (ref 135–145)
SODIUM: 114 mmol/L — AB (ref 135–145)
Sodium: 109 mmol/L — CL (ref 135–145)
Sodium: 113 mmol/L — CL (ref 135–145)
Sodium: 115 mmol/L — CL (ref 135–145)
Sodium: 117 mmol/L — CL (ref 135–145)

## 2016-11-19 LAB — OSMOLALITY: OSMOLALITY: 256 mosm/kg — AB (ref 275–295)

## 2016-11-19 LAB — ETHANOL: ALCOHOL ETHYL (B): 142 mg/dL — AB (ref <5)

## 2016-11-19 LAB — MRSA PCR SCREENING: MRSA by PCR: NEGATIVE

## 2016-11-19 LAB — MAGNESIUM: Magnesium: 1.3 mg/dL — ABNORMAL LOW (ref 1.7–2.4)

## 2016-11-19 MED ORDER — IPRATROPIUM-ALBUTEROL 0.5-2.5 (3) MG/3ML IN SOLN: 3.0000 mL | Freq: Four times a day (QID) | RESPIRATORY_TRACT | Status: DC | PRN

## 2016-11-19 MED ORDER — ENSURE ENLIVE PO LIQD
1.0000 | Freq: Three times a day (TID) | ORAL | Status: DC
  Administered 2016-11-19 – 2016-11-22 (×3): 237 mL via ORAL

## 2016-11-19 MED ORDER — DEXTROSE 5 % IV SOLN
INTRAVENOUS | Status: DC
  Administered 2016-11-19: 07:00:00 via INTRAVENOUS

## 2016-11-19 MED ORDER — POTASSIUM CHLORIDE CRYS ER 20 MEQ PO TBCR
40.0000 meq | EXTENDED_RELEASE_TABLET | Freq: Once | ORAL | Status: AC
  Administered 2016-11-19: 40 meq via ORAL
  Filled 2016-11-19: qty 2

## 2016-11-19 MED ORDER — SODIUM CHLORIDE 0.9% FLUSH
3.0000 mL | Freq: Two times a day (BID) | INTRAVENOUS | Status: DC
  Administered 2016-11-19: 3 mL via INTRAVENOUS

## 2016-11-19 MED ORDER — SODIUM CHLORIDE 0.45 % IV SOLN
INTRAVENOUS | Status: DC
  Administered 2016-11-19: 16:00:00 via INTRAVENOUS

## 2016-11-19 MED ORDER — ENSURE ENLIVE PO LIQD
1.0000 | Freq: Two times a day (BID) | ORAL | Status: DC
  Administered 2016-11-19: 237 mL via ORAL

## 2016-11-19 MED ORDER — LORAZEPAM 2 MG/ML IJ SOLN: 1.0000 mg | Freq: Four times a day (QID) | INTRAMUSCULAR | Status: AC | PRN

## 2016-11-19 MED ORDER — SODIUM CHLORIDE 0.9 % IV SOLN
INTRAVENOUS | Status: AC
  Administered 2016-11-19 – 2016-11-20 (×2): via INTRAVENOUS

## 2016-11-19 MED ORDER — ENOXAPARIN SODIUM 30 MG/0.3ML ~~LOC~~ SOLN
20.0000 mg | SUBCUTANEOUS | Status: DC
  Administered 2016-11-19 – 2016-11-22 (×4): 20 mg via SUBCUTANEOUS
  Filled 2016-11-19: qty 0.2
  Filled 2016-11-19 (×4): qty 0.3
  Filled 2016-11-19 (×3): qty 0.2

## 2016-11-19 MED ORDER — LORAZEPAM 1 MG PO TABS
1.0000 mg | ORAL_TABLET | Freq: Four times a day (QID) | ORAL | Status: AC | PRN
  Administered 2016-11-19 – 2016-11-22 (×4): 1 mg via ORAL
  Filled 2016-11-19 (×4): qty 1

## 2016-11-19 MED ORDER — MOMETASONE FURO-FORMOTEROL FUM 200-5 MCG/ACT IN AERO
2.0000 | INHALATION_SPRAY | Freq: Two times a day (BID) | RESPIRATORY_TRACT | Status: DC
  Administered 2016-11-19 – 2016-11-22 (×6): 2 via RESPIRATORY_TRACT
  Filled 2016-11-19: qty 8.8

## 2016-11-19 MED ORDER — MAGNESIUM SULFATE 2 GM/50ML IV SOLN
2.0000 g | Freq: Once | INTRAVENOUS | Status: AC
  Administered 2016-11-19: 2 g via INTRAVENOUS
  Filled 2016-11-19: qty 50

## 2016-11-19 NOTE — Progress Notes (Signed)
Critical Value: NA 117; Internal medicine paged, D5 discontinued and labs ordered NA 115; Internal medicine paged, no orders placed

## 2016-11-19 NOTE — Progress Notes (Signed)
Initial Nutrition Assessment  DOCUMENTATION CODES:   Severe malnutrition in context of chronic illness  INTERVENTION:   Recommend checking PO4 levels as pt is severely malnourished.   Ensure Enlive po TID, each supplement provides 350 kcal and 20 grams of protein  Nursing Staff: Please provide patient with snack in-between meals. RD has set up snack to be delivered by nutritional services at 10am, 2pm, and HS; snack will be delivered in bag with pt's information to unit nourishment refrigerator. If snack is unavailable, please provide unit nourishment, such as applesauce or graham crackers and peanut butter.  NUTRITION DIAGNOSIS:   Malnutrition (Severe) related to chronic illness (COPD, alcohol abuse, breast Ca) as evidenced by severe depletion of body fat, severe depletion of muscle mass.  GOAL:   Patient will meet greater than or equal to 90% of their needs   MONITOR:   PO intake, Supplement acceptance, Weight trends, Labs  REASON FOR ASSESSMENT:   Malnutrition Screening Tool    ASSESSMENT:   Pt with PMH of alcohol abuse, COPD, HTN, questionable cirrhosis, and breast Ca. Presented with confusion, decreased PO, and increased alcohol consumption. Admitted for hyponatremia.  Upon pt interview, pt seemed to be confused. Reports experiencing no loss of appetite prior to admission consuming two meals per day that consist of sandwiches, meat, vegetables, and grains. Reports daily snacking with potato chips and ice cream.   Pt states that she has not had any prior wt loss, but when probed a little further, states she began to lose wt after her cancer diagnosis in 2007. Reports a usual body wt of 130 lbs, unable to give the time she was at that weight last. Records indicate a steady decline in wt since 2014, though time frame is not significant, wt loss is excessive. The pt is underweight.     Nutrition-Focused physical exam completed. Findings are severe fat depletion, severe muscle  depletion, and no edema. Pt meets clinical characteristics of severe malnutrtion from physical exam, suspect pt meets requirements for severe malnutrition for wt loss and decreased energy intake, but could not get a definite time frame for each.   MD note reflects excessive alcohol consumption and decreased PO intake, resulting in hyponatremia and malnutrition.   Medications reviewed and include: folic acid, MVI, thiamine Labs reviewed: Na 115 , Cl 82, Glucose 193, Mg 1.3 (repleted magsulfate)  Diet Order:  Diet regular Room service appropriate? Yes; Fluid consistency: Thin  Skin:  Wound (see comment) (Scratch marks bilateral arms)  Last BM:  Unknown  Height:   Ht Readings from Last 1 Encounters:  11/18/16 5\' 2"  (1.575 m)    Weight:   Wt Readings from Last 1 Encounters:  11/19/16 82 lb 3.2 oz (37.3 kg)    Ideal Body Weight:  50 kg  BMI:  Body mass index is 15.03 kg/m.  Estimated Nutritional Needs:   Kcal:  1400-1600  Protein:  75-90  Fluid:  > 1.2 L  EDUCATION NEEDS:   No education needs identified at this time  [place], LDN 11/19/2016 4:02 PM

## 2016-11-19 NOTE — Progress Notes (Signed)
Subjective: Patient stated she felt good this morning. She denied confusion, headache, visual changes, or sleep disturbances. She indicated that she eats a decent breakfast most of the time consisting of eggs, grits, sausage but not every day. Most of the time she eats a sandwich if she gets hungry. Admitted to drinking 2, 3 packs of 24oz beers per day? Denied irritation, tremor or rapid heart rate. Was in agreement with plan to continue treating her for her hyponatremia until her level returns to near normal or discharge is indicated.   Objective:  Vital signs in last 24 hours: Vitals:   11/19/16 0504 11/19/16 0840 11/19/16 1008 11/19/16 1601  BP: (!) 166/98  115/68   Pulse: (!) 106  (!) 110   Resp: (!) 24  20   Temp: 98.6 F (37 C)  98.3 F (36.8 C)   TempSrc: Oral  Oral   SpO2: 99% 98% 100%   Weight:    82 lb 3.2 oz (37.3 kg)  Height:       Review of Systems  Constitutional: Negative for fever and weight loss.  HENT: Positive for hearing loss.   Respiratory: Negative for cough and shortness of breath.   Cardiovascular: Negative for chest pain.  Gastrointestinal: Negative for abdominal pain.  Psychiatric/Behavioral: Positive for memory loss. The patient is nervous/anxious.    Physical Exam  Constitutional: No distress.  HENT:  Head: Normocephalic and atraumatic.  Cardiovascular: Normal rate, regular rhythm and normal heart sounds.   Pulmonary/Chest: Effort normal and breath sounds normal. No respiratory distress.  Abdominal: Soft. Bowel sounds are normal. She exhibits no distension.  Neurological: She is alert.  Skin: Skin is warm and dry. Capillary refill takes less than 2 seconds. She is not diaphoretic.  Psychiatric: She has a normal mood and affect.    Assessment/Plan:  Principal Problem:   Hyponatremia Active Problems:   Hyperlipidemia   Excessive drinking alcohol   COPD (chronic obstructive pulmonary disease) ([facility])   Essential hypertension   Tobacco use  disorder   Aortic atherosclerosis ([facility])   Body mass index (BMI) 19.9 or less, adult  Assessment and plan:  1. Euvolemic Hypotonic Hyponatremia: Similar episode in March 2016. Patient presented with confusion in the setting of Na 110. U Na 38, Uosm 233, and serum osm 256. Workup consistent with low solute intake. Likely a combination of tea and toast syndrome and beer potomania in the setting of severe malnourishment, recent decrease in  PO intake and increased alcohol intake.  - Received fluid bolus of 500cc in ED -Was started on normal saline, sodium corrected rapidly, patient started on D5W with Na leveling. Repeat BMP Na was 115 down from 117 and patient started on.Marland KitchenMarland KitchenSee below - NS _0 /hr  - Na checks q4h  - Na goal 224+/-1 in the next 24hrs by 1800 11/20/2016  2. Alcohol abuse:  - BAL 142 on admission  - Folate 23m daily + IV/PO thiamine 1015m+ MVI continued  - CIWA protocol with no ativan ordered, patient at <1  3. COPD:  - Will continue home dulera - Holding home spiriva   - Duonebs q6h PRN   4. Severe malnutrition: BMI 13  - Encourage PO intake, patient refuses to eat  - Nutrition consult  - Supplemental shakes if patient in agreement   5. HTN: chronic. Not on any antihypertensive medications at home.  - Will continue to monitor   6. Falls: hyponatremia vs. alcohol intoxication  - Fall precautions   7. Tobacco use:  -  Nicotine patch 60m  - Continuing to encourage smoking cessation    Dispo: Anticipated discharge in approximately 3 day(s).   HKathi Ludwig MD 11/19/2016, 4:16 PM Pager: Pager# [phone]

## 2016-11-19 NOTE — Evaluation (Signed)
Physical Therapy Evaluation Patient Details Name: Kayla Tyler MRN: [id number] DOB: February 17, 1946 Today's Date: 11/19/2016   History of Present Illness  Pt is a 71 yo female admitted through ED on 11/18/16 with AMS and increased falls. Pt was diagnosed wtih hyponatremia. PMH significant for alchohol abuse, COPD, HTN, HLD, ?Liver cirrhosis, breast CA in remission, aortic atherosclerosis, COPD.   Clinical Impression  Pt presents with the above diagnosis and below deficits for therapy evaluation. Prior to admission, pt lived with her son and was independent with mod ADLs. Pt's son performed grocery shopping and transportation to MD appointments. Pt required Mod A for all mobility this session and has a high fear of falling with standing and stand pivot transfers. Pt will benefit from continued acute PT follow-up to address the below deficits prior to discharge to venue recommended below.     Follow Up Recommendations SNF;Supervision for mobility/OOB    Equipment Recommendations  Rolling walker with 5" wheels    Recommendations for Other Services       Precautions / Restrictions Precautions Precautions: Fall Restrictions Weight Bearing Restrictions: No      Mobility  Bed Mobility Overal bed mobility: Needs Assistance Bed Mobility: Supine to Sit     Supine to sit: Mod assist     General bed mobility comments: Mod A with use of pad to bring hips EOB  Transfers Overall transfer level: Needs assistance Equipment used: Rolling walker (2 wheeled);1 person hand held assist Transfers: Sit to/from [facility] Sit to Stand: Mod assist;From elevated surface Stand pivot transfers: Mod assist;From elevated surface       General transfer comment: Mod A to stand from EOB with RW. pt fearful of mobility and attempted gait without RW and just 1 person assist. Pt is Mod A for sit to stand and for pivot transfer to recliner and is very fearful of falling.    Ambulation/Gait             General Gait Details: Did not attempt this session, will need +2  Stairs            Wheelchair Mobility    Modified Rankin (Stroke Patients Only)       Balance Overall balance assessment: Needs assistance Sitting-balance support: No upper extremity supported;Feet supported Sitting balance-Leahy Scale: Fair     Standing balance support: Bilateral upper extremity supported Standing balance-Leahy Scale: Poor                               Pertinent Vitals/Pain Pain Assessment: No/denies pain    Home Living Family/patient expects to be discharged to:: Private residence Living Arrangements: Children (son) Available Help at Discharge: Family;Available PRN/intermittently Type of Home: House Home Access: Stairs to enter Entrance Stairs-Rails: Right Entrance Stairs-Number of Steps: 3 Home Layout: One level Home Equipment: Cane - single point      Prior Function Level of Independence: Independent with assistive device(s)         Comments: pt reports using cane for mobility, son assists with grocery shopping they both cook     Hand Dominance   Dominant Hand: Right    Extremity/Trunk Assessment   Upper Extremity Assessment Upper Extremity Assessment: Defer to OT evaluation    Lower Extremity Assessment Lower Extremity Assessment: Generalized weakness    Cervical / Trunk Assessment Cervical / Trunk Assessment: Normal  Communication   Communication: No difficulties  Cognition Arousal/Alertness: Awake/alert Behavior During Therapy: Flat  affect Overall Cognitive Status: No family/caregiver present to determine baseline cognitive functioning                                 General Comments: oriented to person, place not time      General Comments      Exercises     Assessment/Plan    PT Assessment Patient needs continued PT services  PT Problem List Decreased strength;Decreased activity tolerance;Decreased  balance;Decreased mobility;Decreased knowledge of use of DME       PT Treatment Interventions DME instruction;Gait training;Functional mobility training;Therapeutic activities;Therapeutic exercise;Balance training;Patient/family education    PT Goals (Current goals can be found in the Care Plan section)  Acute Rehab PT Goals Patient Stated Goal: to go home PT Goal Formulation: With patient Time For Goal Achievement: 12/03/16 Potential to Achieve Goals: Good    Frequency Min 2X/week   Barriers to discharge        Co-evaluation               AM-PAC PT "6 Clicks" Daily Activity  Outcome Measure Difficulty turning over in bed (including adjusting bedclothes, sheets and blankets)?: None Difficulty moving from lying on back to sitting on the side of the bed? : Total Difficulty sitting down on and standing up from a chair with arms (e.g., wheelchair, bedside commode, etc,.)?: Total Help needed moving to and from a bed to chair (including a wheelchair)?: Total Help needed walking in hospital room?: Total Help needed climbing 3-5 steps with a railing? : Total 6 Click Score: 9    End of Session Equipment Utilized During Treatment: Gait belt Activity Tolerance: Patient limited by fatigue Patient left: in chair;with call bell/phone within reach;with chair alarm set Nurse Communication: Mobility status PT Visit Diagnosis: Unsteadiness on feet (R26.81);History of falling (Z91.81);Difficulty in walking, not elsewhere classified (R26.2)    Time: 5188-4166 PT Time Calculation (min) (ACUTE ONLY): 22 min   Charges:   PT Evaluation $PT Eval Moderate Complexity: 1 Procedure     PT G Codes:        Scheryl Marten PT, DPT  [phone]   Shanon Rosser 11/19/2016, 1:59 PM

## 2016-11-19 NOTE — Progress Notes (Signed)
Unable to complete admission questions d/t pt being confused. Pt thinks it is 56 and keeps forgetting she is in the hospital.    Eleanora Neighbor, RN

## 2016-11-19 NOTE — Progress Notes (Signed)
NA 115. MD made aware.

## 2016-11-19 NOTE — Progress Notes (Signed)
New Admission Note:  Arrival Method: By bed from ED around 0000 Mental Orientation: Alert to self and place( at times) Telemetry: [address], CCMD notified Assessment: Completed Skin: Completed, refer to flowsheets IV: Left hand Pain: Denies Tubes: None Safety Measures: Safety Fall Prevention Plan was given, discussed and signed. Admission: Completed [address] Orientation: Patient has been orientated to the room, unit and the staff. Family: None at bedside   Orders have been reviewed and implemented. Will continue to monitor the patient. Call light has been placed within reach and bed alarm has been activated.   Perry Mount, RN  Phone Number: [phone]

## 2016-11-19 NOTE — Progress Notes (Signed)
Pt has been yelling down the hall, trying to get out of bed, taking clothes off, and stating "I want my beer in a glass." This nurse informed MD and was given prn orders if needed. Pt is on CIWA. Will continue to monitor.   Eleanora Neighbor, RN

## 2016-11-19 NOTE — Progress Notes (Signed)
Internal Medicine Attending  Date: 11/19/2016  Patient name: Kayla Tyler Medical record number: [id number] Date of birth: 25-Apr-1946 Age: 71 y.o. Gender: female  I saw and evaluated the patient. I reviewed the resident's note by Dr. Berline Lopes and I agree with the resident's findings and plans as documented in his progress note.  Please see my H&P dated 11/19/2016 and attached to Dr. Jacqualine Code H&P dated 11/18/2016 for the specifics of my evaluation, assessment, and plan from earlier today.

## 2016-11-19 NOTE — Progress Notes (Signed)
CRITICAL VALUE ALERT  Critical Value:  Sodium 109   Date & Time Notied:  11/19/2016, 0020  Provider Notified: Santos-Sanchez  Orders Received/Actions taken: Increase fluids to 125, recheck sodium in 4 hours

## 2016-11-20 LAB — BASIC METABOLIC PANEL
Anion gap: 8 (ref 5–15)
Anion gap: 4 — ABNORMAL LOW (ref 5–15)
Anion gap: 8 (ref 5–15)
Anion gap: 8 (ref 5–15)
Anion gap: 6 (ref 5–15)
Anion gap: 6 (ref 5–15)
BUN: 10 mg/dL (ref 6–20)
BUN: 8 mg/dL (ref 6–20)
BUN: 8 mg/dL (ref 6–20)
BUN: 8 mg/dL (ref 6–20)
BUN: 8 mg/dL (ref 6–20)
BUN: 6 mg/dL (ref 6–20)
CHLORIDE: 88 mmol/L — AB (ref 101–111)
CHLORIDE: 90 mmol/L — AB (ref 101–111)
CHLORIDE: 89 mmol/L — AB (ref 101–111)
CHLORIDE: 90 mmol/L — AB (ref 101–111)
CHLORIDE: 90 mmol/L — AB (ref 101–111)
CHLORIDE: 92 mmol/L — AB (ref 101–111)
CO2: 21 mmol/L — AB (ref 22–32)
CO2: 24 mmol/L (ref 22–32)
CO2: 23 mmol/L (ref 22–32)
CO2: 24 mmol/L (ref 22–32)
CO2: 24 mmol/L (ref 22–32)
CO2: 24 mmol/L (ref 22–32)
CREATININE: 0.51 mg/dL (ref 0.44–1.00)
CREATININE: 0.47 mg/dL (ref 0.44–1.00)
CREATININE: 0.53 mg/dL (ref 0.44–1.00)
CREATININE: 0.56 mg/dL (ref 0.44–1.00)
CREATININE: 0.52 mg/dL (ref 0.44–1.00)
CREATININE: 0.49 mg/dL (ref 0.44–1.00)
Calcium: 7.4 mg/dL — ABNORMAL LOW (ref 8.9–10.3)
Calcium: 7.4 mg/dL — ABNORMAL LOW (ref 8.9–10.3)
Calcium: 7.8 mg/dL — ABNORMAL LOW (ref 8.9–10.3)
Calcium: 7.9 mg/dL — ABNORMAL LOW (ref 8.9–10.3)
Calcium: 7.7 mg/dL — ABNORMAL LOW (ref 8.9–10.3)
Calcium: 7.8 mg/dL — ABNORMAL LOW (ref 8.9–10.3)
GFR calc Af Amer: >60 mL/min (ref >60)
GFR calc Af Amer: >60 mL/min (ref >60)
GFR calc Af Amer: >60 mL/min (ref >60)
GFR calc Af Amer: >60 mL/min (ref >60)
GFR calc non Af Amer: >60 mL/min (ref >60)
GFR calc non Af Amer: >60 mL/min (ref >60)
GFR calc non Af Amer: >60 mL/min (ref >60)
GFR calc non Af Amer: >60 mL/min (ref >60)
GFR calc non Af Amer: >60 mL/min (ref >60)
GFR calc non Af Amer: >60 mL/min (ref >60)
GLUCOSE: 87 mg/dL (ref 65–99)
Glucose, Bld: 81 mg/dL (ref 65–99)
Glucose, Bld: 89 mg/dL (ref 65–99)
Glucose, Bld: 125 mg/dL — ABNORMAL HIGH (ref 65–99)
Glucose, Bld: 100 mg/dL — ABNORMAL HIGH (ref 65–99)
Glucose, Bld: 123 mg/dL — ABNORMAL HIGH (ref 65–99)
POTASSIUM: 5.9 mmol/L — AB (ref 3.5–5.1)
POTASSIUM: 3.6 mmol/L (ref 3.5–5.1)
Potassium: 5.2 mmol/L — ABNORMAL HIGH (ref 3.5–5.1)
Potassium: 3.9 mmol/L (ref 3.5–5.1)
Potassium: 4.5 mmol/L (ref 3.5–5.1)
Potassium: 3.2 mmol/L — ABNORMAL LOW (ref 3.5–5.1)
SODIUM: 117 mmol/L — AB (ref 135–145)
Sodium: 118 mmol/L — CL (ref 135–145)
Sodium: 120 mmol/L — ABNORMAL LOW (ref 135–145)
Sodium: 122 mmol/L — ABNORMAL LOW (ref 135–145)
Sodium: 120 mmol/L — ABNORMAL LOW (ref 135–145)
Sodium: 122 mmol/L — ABNORMAL LOW (ref 135–145)

## 2016-11-20 MED ORDER — SODIUM CHLORIDE 0.9 % IV SOLN
INTRAVENOUS | Status: DC
  Administered 2016-11-20: 22:00:00 via INTRAVENOUS

## 2016-11-20 NOTE — Evaluation (Signed)
Occupational Therapy Evaluation Patient Details Name: Kayla Tyler MRN: [id number] DOB: [birth year] Today's Date: 11/20/2016    History of Present Illness Pt is a 71 yo female admitted through ED on 11/18/16 with AMS and increased falls. Pt was diagnosed wtih hyponatremia. PMH significant for alchohol abuse, COPD, HTN, HLD, ?Liver cirrhosis, breast CA in remission, aortic atherosclerosis, COPD.    Clinical Impression   This 71 y/o F presents with the above. At baseline Pt reports she is mod independent with ADLs and functional mobility. Pt reports her son lives with her who assists with IADL completion. Pt currently requires MaxA for LB ADLs, and MaxA for stand pivot transfers using RW. Pt will benefit from continued acute OT services and will benefit from continued ST SNF OT services to maximize Pt's safety and independence prior to return home.     Follow Up Recommendations  SNF;Supervision/Assistance - 24 hour    Equipment Recommendations  Other (comment) (defer to next venue of care )           Precautions / Restrictions Precautions Precautions: Fall Restrictions Weight Bearing Restrictions: No      Mobility Bed Mobility               General bed mobility comments: Pt sitting EOB upon entering room   Transfers Overall transfer level: Needs assistance Equipment used: Rolling walker (2 wheeled) Transfers: Sit to/from [facility] Sit to Stand: Mod assist Stand pivot transfers: Mod assist            Balance Overall balance assessment: Needs assistance Sitting-balance support: No upper extremity supported;Feet supported Sitting balance-Leahy Scale: Fair     Standing balance support: Bilateral upper extremity supported Standing balance-Leahy Scale: Poor Standing balance comment: relies on UE support                            ADL either performed or assessed with clinical judgement   ADL Overall ADL's : Needs  assistance/impaired Eating/Feeding: Set up;Sitting   Grooming: Wash/dry face;Set up;Sitting   Upper Body Bathing: Min guard;Sitting   Lower Body Bathing: Minimal assistance;Sit to/from stand   Upper Body Dressing : Min guard;Sitting   Lower Body Dressing: Minimal assistance;Sit to/from stand   Toilet Transfer: Moderate assistance;Stand-pivot;BSC;RW Toilet Transfer Details (indicate cue type and reason): simulated through bed to chair transfer  [place] and Hygiene: Moderate assistance;Sit to/from stand       Functional mobility during ADLs: Moderate assistance;Rolling walker       Vision Baseline Vision/History: Wears glasses Vision Assessment?: No apparent visual deficits            Pertinent Vitals/Pain Pain Assessment: No/denies pain     Hand Dominance Right   Extremity/Trunk Assessment Upper Extremity Assessment Upper Extremity Assessment: Generalized weakness   Lower Extremity Assessment Lower Extremity Assessment: Defer to PT evaluation   Cervical / Trunk Assessment Cervical / Trunk Assessment: Normal   Communication Communication Communication: No difficulties   Cognition Arousal/Alertness: Awake/alert Behavior During Therapy: Flat affect Overall Cognitive Status: Within Functional Limits for tasks assessed                                 General Comments: oriented to person, place, independently using visual orientation cues to state date   General Comments  Home Living Family/patient expects to be discharged to:: Private residence Living Arrangements: Children Available Help at Discharge: Family;Available PRN/intermittently Type of Home: House Home Access: Stairs to enter [employer] of Steps: 3 Entrance Stairs-Rails: Right Home Layout: One level     Bathroom Shower/Tub: Tub/shower unit         Home Equipment: Cane - single point;Shower seat          Prior  Functioning/Environment Level of Independence: Independent with assistive device(s)        Comments: pt reports using cane for mobility, sits on shower chair for bathing; son assists with grocery shopping they both cook        OT Problem List: Decreased strength;Decreased activity tolerance;Impaired balance (sitting and/or standing)      OT Treatment/Interventions: Self-care/ADL training;DME and/or AE instruction;Therapeutic activities;Balance training;Therapeutic exercise;Energy conservation;Patient/family education    OT Goals(Current goals can be found in the care plan section) Acute Rehab OT Goals Patient Stated Goal: to go home OT Goal Formulation: With patient Time For Goal Achievement: 12/04/16 Potential to Achieve Goals: Good ADL Goals Pt Will Perform Grooming: with set-up;sitting Pt Will Perform Upper Body Bathing: with set-up;sitting Pt Will Perform Upper Body Dressing: sitting;with set-up Pt Will Perform Lower Body Dressing: with min assist;sit to/from stand Pt Will Transfer to Toilet: with min assist;stand pivot transfer;bedside commode Pt Will Perform Toileting - Clothing Manipulation and hygiene: with min assist;sit to/from stand Pt Will Perform Tub/Shower Transfer: Tub transfer;shower seat;rolling walker;with mod assist  OT Frequency: Min 2X/week                             AM-PAC PT "6 Clicks" Daily Activity     Outcome Measure Help from another person eating meals?: None Help from another person taking care of personal grooming?: A Little Help from another person toileting, which includes using toliet, bedpan, or urinal?: A Lot Help from another person bathing (including washing, rinsing, drying)?: A Lot Help from another person to put on and taking off regular upper body clothing?: A Little Help from another person to put on and taking off regular lower body clothing?: A Lot 6 Click Score: 16   End of Session Equipment Utilized During Treatment:  Surveyor, mining Communication: Mobility status  Activity Tolerance: Patient tolerated treatment well Patient left: in chair;with chair alarm set;with nursing/sitter in room  OT Visit Diagnosis: Unsteadiness on feet (R26.81);Muscle weakness (generalized) (M62.81)                Time: 7124-5809 OT Time Calculation (min): 26 min Charges:  OT General Charges $OT Visit: 1 Procedure OT Evaluation $OT Eval Low Complexity: 1 Procedure G-Codes:     Lou Cal, OT Pager [phone] 11/20/2016  Raymondo Band 11/20/2016, 12:59 PM

## 2016-11-20 NOTE — Progress Notes (Signed)
Subjective: Kayla Tyler was sleeping upon entering her room this morning. She did not complain of pain, headache, nausea, or muscle aches or pain. She stated that she felt fine and wanted to know when she could go home. She thought she had finished a little of each meal the day before but was not terribly hungry. Advise was given to increase her PO intake to better enable her to maintain her blood sodium level. She agreed to the course of her treatment and to increasing her PO intake. Patient understood that she would be here until Sunday evening at the earliest.  Objective:  Vital signs in last 24 hours: Vitals:   11/19/16 1601 11/19/16 1806 11/19/16 2121 11/20/16 0403  BP:  118/66 (!) 108/57 99/64  Pulse:  (!) 102 (!) 106 (!) 111  Resp:  _0 Temp:  98.5 F (36.9 C) 98.5 F (36.9 C) 98 F (36.7 C)  TempSrc:  Oral Oral Oral  SpO2:  98% 98% 93%  Weight: 82 lb 3.2 oz (37.3 kg)     Height:      Review of Systems  Constitutional: Negative for chills and fever.  Eyes: Negative for blurred vision and double vision.  Respiratory: Negative for cough and shortness of breath.   Cardiovascular: Negative for chest pain, palpitations and leg swelling.  Gastrointestinal: Negative for abdominal pain, heartburn, nausea and vomiting.  Skin: Negative for itching and rash.  Neurological: Positive for weakness.  Psychiatric/Behavioral: The patient is nervous/anxious.    Physical Exam  Constitutional: She is oriented to person, place, and time. No distress.  HENT:  Head: Normocephalic and atraumatic.  Cardiovascular: Normal rate, regular rhythm and normal heart sounds.   No murmur heard. Pulmonary/Chest: Effort normal and breath sounds normal. No respiratory distress. She has no wheezes.  Abdominal: Soft. Bowel sounds are normal. She exhibits no distension. There is no tenderness.  Neurological: She is alert and oriented to person, place, and time.  Skin: Skin is warm and dry. She is not  diaphoretic.  Psychiatric: She has a normal mood and affect.    Assessment/Plan:  Principal Problem:   Hyponatremia Active Problems:   Hyperlipidemia   Excessive drinking alcohol   COPD (chronic obstructive pulmonary disease) (HCC)   Essential hypertension   Tobacco use disorder   Aortic atherosclerosis (HCC)   Body mass index (BMI) 19.9 or less, adult   Severe malnutrition (HCC)   Alcohol abuse  1. Euvolemic Hypotonic Hyponatremia: Similar episode in March 2016. Patient presented with confusion in the setting of Na 110. U Na 38, Uosm 233, and serum osm 256. Workup consistent with low solute intake. Likely a combination of tea and toast syndrome and beer potomania in the setting of severe malnourishment, recent decrease in PO intake and increased alcohol intake.  - Received fluid bolus of 500cc in ED -Was started on normal saline, sodium corrected rapidly, patient started on D5W with Na leveling. Repeat BMP Na was 115 down from 117 and patient started on.Marland KitchenMarland KitchenSee below - NS _1 /hr at 0245 and continued this morning - Na checks q4h continued  - Na goal 221+/-1 in the next 24hrs by 1800 11/20/2016  2. Alcohol abuse:  - BAL 142 on admission  - Folate 34m daily + IV/PO thiamine 104m+ MVI continued  - CIWA protocol with no ativan ordered, patient at <1  3. COPD:  - Will continue home dulera - Holding home spiriva - Duonebs q6h PRN   4. Severe malnutrition: BMI 13 -  Encourage PO intake, patient refuses to eat, will pick at food according to nurse - Nutrition consult  - Supplemental shakes if patient in agreement   5. HTN: chronic. Not on any antihypertensive medications at home.  - Will continue to monitor   6. Falls: hyponatremia vs. alcohol intoxication  - Fall precautions   7. Tobacco use:  - Nicotine patch 46m  - Continuing to encourage smoking cessation    Dispo: Anticipated discharge in approximately 2 day(s).   HKathi Ludwig MD 11/20/2016, 8:25  AM Pager: Pager# [phone]

## 2016-11-20 NOTE — Consult Note (Signed)
   [facility] Inpatient Consult   11/20/2016  Kayla Tyler Mar 12, 1946 [id number]  Patient had previously been outreached by [facility]. Patient noted in the [place].   Admitted with Hyponatremia.  Met with the patient at the bedside, patient is currently on CIWA protocol. She is alert, awake and answering questions appropriately. Patient denies any difficulty with food, transportation [states her son lives with her and drives her to appointments].  She states she is not on any home/routine medications. She would use [facility] if needed. Patient denies any needs at this time.  Patient accepted a brochure with contact information.  For questions or referrals, please contact:  Natividad Brood, RN BSN [facility] Liaison  [phone] business mobile phone Toll free office [phone]

## 2016-11-20 NOTE — Progress Notes (Signed)
CRITICAL VALUE ALERT  Critical Value:  Na+118  Date & Time Notied:  11/20/16 08:38  Provider Notified: 08:43  Orders Received/Actions taken:

## 2016-11-20 NOTE — Progress Notes (Signed)
Internal Medicine Attending  Date: 11/20/2016  Patient name: Kayla Tyler Medical record number: [id number] Date of birth: 08-Sep-1945 Age: 71 y.o. Gender: female  I saw and evaluated the patient. I reviewed the resident's note by Dr. Berline Lopes and I agree with the resident's findings and plans as documented in his progress note.  When seen on rounds this morning Kayla Tyler was resting comfortably. She had no complaints other than being anxious to leave the hospital. She continues to have a very poor appetite. Physical therapy is recommending skilled nursing facility after discharge. We will be sure to discuss this issue with her and assess her willingness to follow that recommendation. Although she currently has euvolemic hypotonic hyponatremia she presented with hypovolemic hypotonic hyponatremia. She is currently on normal saline at 125 mL per hour per our calculations and her sodium is increasing appropriately. We will continue to follow serial sodiums every 4 hours to assure we do not overcorrect. I anticipate she will be nearing a sodium of 130 by Sunday morning and therefore may be ready for discharge home at that time.

## 2016-11-20 NOTE — Discharge Summary (Signed)
Name: Kayla Tyler MRN: [id number] DOB: Feb 21, 1946 71 y.o. PCP: Kayla Asal, MD  Date of Admission: 11/18/2016  8:04 PM Date of Discharge: 11/22/2016 Attending Physician: Oval Linsey, MD  Discharge Diagnosis: Principal Problem:   Hyponatremia Active Problems:   Hyperlipidemia   Excessive drinking alcohol   COPD (chronic obstructive pulmonary disease) ([place])   Tobacco use disorder   Aortic atherosclerosis (HCC)   Body mass index (BMI) 19.9 or less, adult   Severe malnutrition (HCC)   Alcohol abuse   Discharge Medications: Allergies as of 11/22/2016      Reactions   Lisinopril Swelling   Lip swelling (reaction to lisinopril/hctz)   Penicillins Other (See Comments)   Pt passes out. Has patient had a PCN reaction causing immediate rash, facial/tongue/throat swelling, SOB or lightheadedness with hypotension: No Has patient had a PCN reaction causing severe rash involving mucus membranes or skin necrosis: No Has patient had a PCN reaction that required hospitalization yes Has patient had a PCN reaction occurring within the last 10 years: No If all of the above answers are "NO", then may proceed with Cephalosporin use.      Medication List    STOP taking these medications   ENSURE COMPLETE SHAKE Liqd     TAKE these medications   folic acid 1 MG tablet Commonly known as:  FOLVITE Take 1 tablet (1 mg total) by mouth daily.   levalbuterol 45 MCG/ACT inhaler Commonly known as:  XOPENEX HFA Inhale 2 puffs into the lungs every 4 (four) hours as needed for wheezing.   mometasone-formoterol 200-5 MCG/ACT Aero Commonly known as:  DULERA Inhale 2 puffs into the lungs 2 (two) times daily.   multivitamin with minerals Tabs tablet Take 1 tablet by mouth daily. Start taking on:  11/23/2016   nicotine 14 mg/24hr patch Commonly known as:  NICODERM CQ - dosed in mg/24 hours Place 1 patch (14 mg total) onto the skin daily. Start taking on:  11/23/2016   sodium chloride 1 g  tablet Take 1 tablet (1 g total) by mouth 3 (three) times daily with meals.   thiamine 100 MG tablet Take 1 tablet (100 mg total) by mouth daily.   tiotropium 18 MCG inhalation capsule Commonly known as:  SPIRIVA HANDIHALER Place 1 capsule (18 mcg total) into inhaler and inhale daily.   Vitamin D (Ergocalciferol) 50000 units Caps capsule Commonly known as:  DRISDOL Take 1 capsule (50,000 Units total) by mouth every 7 (seven) days.       Disposition and follow-up:   [name] was discharged from [facility] in Good condition.  At the hospital follow up visit please address:  1.  Please follow up with PCP within one week.   2.  Labs / imaging needed at time of follow-up: BMP, please repeat at visit with PCP   3.  Pending labs/ test needing follow-up: BMP  Follow-up Appointments: Follow-up [facility] Follow up in 5 day(s).   Why:  Please call to schedule an appointment with your provider within 1 week.  Contact information: [address] [place] [facility] Course by problem list: Principal Problem:   Hyponatremia Active Problems:   Hyperlipidemia   Excessive drinking alcohol   COPD (chronic obstructive pulmonary disease) (HCC)   Tobacco use disorder   Aortic atherosclerosis (HCC)   Body mass index (BMI) 19.9 or less,  adult   Severe malnutrition (HCC)   Alcohol abuse   1. Euvolemic Hypotonic Hyponatremia:  [name] is a 71 year old woman with a history of severe malnutrition, breast cancer status post mastectomy in 2007 and presumed to be in remission, alcohol abuse, hypertension, chronic obstructive pulmonary disease who presents at the request of her son for frequent falls and confusion over the last week. She was found to have significant hyponatremia with a sodium level of 110 as well as being intoxicated with an ethanol level of 142. Examination was  notable for severe malnutrition and weightloss. She was therefore diagnoses as being hypovolemic, hypotonic with hyponatremia. The hypovolemia is related to the decreased oral intake over the last several days. The patient experienced a similar episode in March 2016 and with chronic hyponatremia. Patient presented with confusion in the setting of Na 110. U Na 38, Uosm 233, and serum osm 256. Workup consistent with low solute intake. Likely a combination of tea and toast syndrome and beer potomania in the setting of severe malnourishment, recent decrease in PO intake and increased alcohol intake.  Received fluid bolus of 500cc in ED followed by continues infusion of normal saline. The rate was varied throughout her stay so as not to exceed a rise >56mmol/L per 24hr period. On two occassions the rate was in excess of the recommended rise prompting a discontinuation of her normal saline and administration of D5W. This corrected the rapid increase in sodium which allowed continuation of the normal saline at a predictable rate. Her sodium was checked via basic metabolic profile every 4 hours and followed by appropriate adjustments based on fluid rate and type given over the time prior to the lab draw. Given her chronically hyponatremic state, the plan was to discharge the patient when she reached her stable baseline of approximately 1280-162mmol/L. In addition, it should be noted that the patient has been refusing to consume any significant quantity of nutrition during her stay. Although counseled extensively on proper dietary needs and the requirement of such to maintain her sodium levels, the patient refuses to increase her PO intake despite her BMI of 13. She has remained pleasant but steadfast in her disposition throughout her stay. The patient was placed on salt tablets on discharge.   2. Alcohol abuse: Blood EtOH level was 142 on admission prompting the patient to be placed on folate 1mg  daily, thiamine 100mg .  She was counseled extensively on alcohol cessation to which she refused to respond or discuss at length. She would only nod that she had heard our concerns. She was placed on CIWA protocol without ativan. Patient remained pleasant without tremor or increased signs of withdraw throughout her stay.   3. COPD:  Her COPD was managed with her home dulera and duonebs q6 PRN. Her home spiriva was held but will be advised to continue at home. She did not experience shortness of breath of respiratory distress during her stay, but did not a slight increase in sputum production as she remained immobile by will.   4. Falls: Fall precautions as patient has reported experiencing several recently. She denied feeling dizzy or unstable. Most likely the falls are secondary alcoholic intoxication vs acute severe hyponatremia. Patient was followed by physical therapy during her stay which commented that she customarily complete her activities of daily living at home, shops and takes care of herself. Her son lives with her and assists as necessary. There did not appear to be a need for a stay in an acute care  rehabilitation facility as per report.   5. Tobacco use:  Patient was placed on a nicotine patch 14mg  during her stay. She did not complain of withdraws or leave the room to use tobacco products.   Discharge Vitals:   BP 132/68 (BP Location: Right Arm)   Pulse (!) 105   Temp 97.8 F (36.6 C) (Oral)   Resp 18   Ht 5\' 2"  (1.575 m)   Wt 83 lb 8 oz (37.9 kg)   SpO2 99%   BMI 15.27 kg/m   Pertinent Labs, Studies, and Procedures:  BMP Latest Ref Rng & Units 11/22/2016 11/22/2016 11/22/2016  Glucose 65 - 99 mg/dL 94 79 93  BUN 6 - 20 mg/dL <5(L) <5(L) <5(L)  Creatinine 0.44 - 1.00 mg/dL 0.43(L) 0.34(L) 0.39(L)  BUN/Creat Ratio 11 - 26 - - -  Sodium 135 - 145 mmol/L 128(L) 124(L) 126(L)  Potassium 3.5 - 5.1 mmol/L 3.9 4.4 4.5  Chloride 101 - 111 mmol/L 99(L) 99(L) 99(L)  CO2 22 - 32 mmol/L 24 22 22   Calcium 8.9  - 10.3 mg/dL 8.1(L) 7.7(L) 7.8(L)    Discharge Instructions: Discharge Instructions    Call MD for:  persistant dizziness or light-headedness    Complete by:  As directed    Call MD for:  persistant nausea and vomiting    Complete by:  As directed    Diet - low sodium heart healthy    Complete by:  As directed    Increase activity slowly    Complete by:  As directed       Signed: [name], MD 11/22/2016, 2:22 PM   Pager: Pager# [phone]

## 2016-11-21 LAB — BASIC METABOLIC PANEL
ANION GAP: 5 (ref 5–15)
ANION GAP: 5 (ref 5–15)
ANION GAP: 5 (ref 5–15)
ANION GAP: 5 (ref 5–15)
ANION GAP: 8 (ref 5–15)
ANION GAP: 5 (ref 5–15)
BUN: 5 mg/dL — ABNORMAL LOW (ref 6–20)
BUN: <5 mg/dL — ABNORMAL LOW (ref 6–20)
BUN: <5 mg/dL — ABNORMAL LOW (ref 6–20)
BUN: <5 mg/dL — ABNORMAL LOW (ref 6–20)
BUN: <5 mg/dL — ABNORMAL LOW (ref 6–20)
BUN: <5 mg/dL — ABNORMAL LOW (ref 6–20)
CALCIUM: 7.6 mg/dL — AB (ref 8.9–10.3)
CALCIUM: 7.7 mg/dL — AB (ref 8.9–10.3)
CHLORIDE: 94 mmol/L — AB (ref 101–111)
CO2: 23 mmol/L (ref 22–32)
CO2: 21 mmol/L — AB (ref 22–32)
CO2: 24 mmol/L (ref 22–32)
CO2: 24 mmol/L (ref 22–32)
CO2: 23 mmol/L (ref 22–32)
CO2: 21 mmol/L — AB (ref 22–32)
CREATININE: 0.43 mg/dL — AB (ref 0.44–1.00)
Calcium: 7.5 mg/dL — ABNORMAL LOW (ref 8.9–10.3)
Calcium: 7.7 mg/dL — ABNORMAL LOW (ref 8.9–10.3)
Calcium: 7.9 mg/dL — ABNORMAL LOW (ref 8.9–10.3)
Calcium: 7.7 mg/dL — ABNORMAL LOW (ref 8.9–10.3)
Chloride: 96 mmol/L — ABNORMAL LOW (ref 101–111)
Chloride: 100 mmol/L — ABNORMAL LOW (ref 101–111)
Chloride: 99 mmol/L — ABNORMAL LOW (ref 101–111)
Chloride: 96 mmol/L — ABNORMAL LOW (ref 101–111)
Chloride: 98 mmol/L — ABNORMAL LOW (ref 101–111)
Creatinine, Ser: 0.36 mg/dL — ABNORMAL LOW (ref 0.44–1.00)
Creatinine, Ser: 0.46 mg/dL (ref 0.44–1.00)
Creatinine, Ser: 0.46 mg/dL (ref 0.44–1.00)
Creatinine, Ser: 0.45 mg/dL (ref 0.44–1.00)
Creatinine, Ser: 0.35 mg/dL — ABNORMAL LOW (ref 0.44–1.00)
GFR calc Af Amer: >60 mL/min (ref >60)
GFR calc Af Amer: >60 mL/min (ref >60)
GFR calc Af Amer: >60 mL/min (ref >60)
GFR calc Af Amer: >60 mL/min (ref >60)
GFR calc Af Amer: >60 mL/min (ref >60)
GFR calc Af Amer: >60 mL/min (ref >60)
GFR calc non Af Amer: >60 mL/min (ref >60)
GFR calc non Af Amer: >60 mL/min (ref >60)
GFR calc non Af Amer: >60 mL/min (ref >60)
GFR calc non Af Amer: >60 mL/min (ref >60)
GFR calc non Af Amer: >60 mL/min (ref >60)
GFR calc non Af Amer: >60 mL/min (ref >60)
GLUCOSE: 91 mg/dL (ref 65–99)
GLUCOSE: 86 mg/dL (ref 65–99)
GLUCOSE: 107 mg/dL — AB (ref 65–99)
GLUCOSE: 116 mg/dL — AB (ref 65–99)
GLUCOSE: 114 mg/dL — AB (ref 65–99)
Glucose, Bld: 107 mg/dL — ABNORMAL HIGH (ref 65–99)
POTASSIUM: 3.1 mmol/L — AB (ref 3.5–5.1)
POTASSIUM: 3.5 mmol/L (ref 3.5–5.1)
POTASSIUM: 4 mmol/L (ref 3.5–5.1)
Potassium: 3.6 mmol/L (ref 3.5–5.1)
Potassium: 3.6 mmol/L (ref 3.5–5.1)
Potassium: 3.2 mmol/L — ABNORMAL LOW (ref 3.5–5.1)
Sodium: 124 mmol/L — ABNORMAL LOW (ref 135–145)
Sodium: 126 mmol/L — ABNORMAL LOW (ref 135–145)
Sodium: 128 mmol/L — ABNORMAL LOW (ref 135–145)
Sodium: 125 mmol/L — ABNORMAL LOW (ref 135–145)
Sodium: 125 mmol/L — ABNORMAL LOW (ref 135–145)
Sodium: 124 mmol/L — ABNORMAL LOW (ref 135–145)

## 2016-11-21 MED ORDER — DEXTROSE 5 % IV SOLN
INTRAVENOUS | Status: DC
  Administered 2016-11-21: 13:00:00 via INTRAVENOUS

## 2016-11-21 MED ORDER — POTASSIUM CHLORIDE CRYS ER 20 MEQ PO TBCR
40.0000 meq | EXTENDED_RELEASE_TABLET | Freq: Two times a day (BID) | ORAL | Status: AC
  Administered 2016-11-21 (×2): 40 meq via ORAL
  Filled 2016-11-21 (×2): qty 2

## 2016-11-21 MED ORDER — SODIUM CHLORIDE 0.9 % IV SOLN
INTRAVENOUS | Status: DC
  Administered 2016-11-21 – 2016-11-22 (×2): via INTRAVENOUS

## 2016-11-21 NOTE — Progress Notes (Signed)
   Subjective: Patient was sleeping comfortably in bed this morning. Reported feeling and had no complaints. Stated she has been eating her meals.   Objective:  Vital signs in last 24 hours: Vitals:   11/20/16 2247 11/21/16 0611 11/21/16 0748 11/21/16 1021  BP: (!) 117/52 122/73  (!) 103/47  Pulse: (!) 117 100  90  Resp: 18 16  18   Temp: 98.4 F (36.9 C) 98.5 F (36.9 C)  98 F (36.7 C)  TempSrc: Oral Oral  Oral  SpO2: 98% 95% 92% 99%  Weight:      Height:       Physical Exam  Constitutional: Sleeping comfortably in bed. No distress.  Cardiovascular: Normal rate, regular rhythm and normal heart sounds. No murmur heard. Pulmonary/Chest: Effort normal and breath sounds normal. No respiratory distress. She has no wheezes, rales, or rhonchi.   Abdominal: Soft. Bowel sounds are normal. She exhibits no distension. There is no tenderness.  Neurological: She is alert.  Skin: Skin is warm and dry. She is not diaphoretic.  Psychiatric: She has a normal mood and affect.   Assessment/Plan:  Principal Problem:   Hyponatremia Active Problems:   Hyperlipidemia   Excessive drinking alcohol   COPD (chronic obstructive pulmonary disease) (HCC)   Essential hypertension   Tobacco use disorder   Aortic atherosclerosis (HCC)   Body mass index (BMI) 19.9 or less, adult   Severe malnutrition (HCC)   Alcohol abuse  Euvolemic Hypotonic Hyponatremia: Similar episode in March 2016. Patient presented with confusion in the setting of Na 110. U Na 38, Uosm 233, and serum osm 256. Workup consistent with low solute intake. Likely a combination of tea and toast syndrome and beer potomania in the setting of severe malnourishment, recent decrease in PO intake and increased alcohol intake. Na was 120 at 6:40 pm yesterday. Patient was continued on NS@100  cc/hr. Na corrected rapidly to 126 at 5:56 this morning. Rate of NS decreased to 75 cc/hr, repeat Na 128 at 9:42 am.  - D/c NS - Start D5@50  cc/hr  - Na  checks q4h continued  - Na goal is not to exceed 128 by 6:40 pm today. Then, goal is for her to correct to 130-132 by tomorrow morning and patient may be ready for discharge at that time.   2. Alcohol abuse:  - Folate 1mg  daily + PO thiamine 100mg  + multivitamin continued  - CIWA protocol with no ativan ordered, patient at 0  3. COPD:  - Will continue home dulera - Holding home spiriva - Duonebs q6h PRN   4. Severe malnutrition: - Encourage PO intake - Ensure TID  5. HTN: Not on any antihypertensive medications at home. Continue to be normotensive.  - Will continue to monitor   6. Falls: hyponatremia vs. alcohol intoxication  - Fall precautions   7. Tobacco use:  - Nicotine patch 14mg   - Continuing to encourage smoking cessation   Dispo: Anticipated discharge in approximately 1 day(s).   Shela Leff, MD 11/21/2016, 12:11 PM Pager: [phone]

## 2016-11-21 NOTE — Progress Notes (Signed)
Internal Medicine Attending  Date: 11/21/2016  Patient name: Kayla Tyler Medical record number: [id number] Date of birth: [birth year] Age: 71 y.o. Gender: female  I saw and evaluated the patient. I reviewed the resident's note by Dr. Marlowe Sax and I agree with the resident's findings and plans as documented in her progress note.  When seen on rounds this morning Kayla Tyler was without specific complaints. We are continuing to slowly increase her sodium with a normal saline infusion. I anticipate her sodium will be approximately 130 to 132 tomorrow morning at which point she will be ready for discharge home.

## 2016-11-22 LAB — BASIC METABOLIC PANEL
Anion gap: 5 (ref 5–15)
Anion gap: 3 — ABNORMAL LOW (ref 5–15)
Anion gap: 5 (ref 5–15)
BUN: <5 mg/dL — ABNORMAL LOW (ref 6–20)
BUN: <5 mg/dL — ABNORMAL LOW (ref 6–20)
CALCIUM: 7.8 mg/dL — AB (ref 8.9–10.3)
CALCIUM: 7.7 mg/dL — AB (ref 8.9–10.3)
CALCIUM: 8.1 mg/dL — AB (ref 8.9–10.3)
CO2: 22 mmol/L (ref 22–32)
CO2: 22 mmol/L (ref 22–32)
CO2: 24 mmol/L (ref 22–32)
CREATININE: 0.34 mg/dL — AB (ref 0.44–1.00)
CREATININE: 0.43 mg/dL — AB (ref 0.44–1.00)
Chloride: 99 mmol/L — ABNORMAL LOW (ref 101–111)
Chloride: 99 mmol/L — ABNORMAL LOW (ref 101–111)
Chloride: 99 mmol/L — ABNORMAL LOW (ref 101–111)
Creatinine, Ser: 0.39 mg/dL — ABNORMAL LOW (ref 0.44–1.00)
GFR calc Af Amer: >60 mL/min (ref >60)
GFR calc Af Amer: >60 mL/min (ref >60)
GFR calc Af Amer: >60 mL/min (ref >60)
GFR calc non Af Amer: >60 mL/min (ref >60)
GLUCOSE: 93 mg/dL (ref 65–99)
GLUCOSE: 79 mg/dL (ref 65–99)
GLUCOSE: 94 mg/dL (ref 65–99)
Potassium: 4.5 mmol/L (ref 3.5–5.1)
Potassium: 4.4 mmol/L (ref 3.5–5.1)
Potassium: 3.9 mmol/L (ref 3.5–5.1)
Sodium: 126 mmol/L — ABNORMAL LOW (ref 135–145)
Sodium: 124 mmol/L — ABNORMAL LOW (ref 135–145)
Sodium: 128 mmol/L — ABNORMAL LOW (ref 135–145)

## 2016-11-22 MED ORDER — ADULT MULTIVITAMIN W/MINERALS CH: 1.0000 | ORAL_TABLET | Freq: Every day | ORAL | 0 refills | Status: AC

## 2016-11-22 MED ORDER — SENNOSIDES-DOCUSATE SODIUM 8.6-50 MG PO TABS
1.0000 | ORAL_TABLET | Freq: Two times a day (BID) | ORAL | Status: DC
  Administered 2016-11-22: 1 via ORAL
  Filled 2016-11-22: qty 1

## 2016-11-22 MED ORDER — SODIUM CHLORIDE 1 G PO TABS: 1.0000 g | ORAL_TABLET | Freq: Three times a day (TID) | ORAL | 0 refills | Status: AC

## 2016-11-22 MED ORDER — SODIUM CHLORIDE 1 G PO TABS
1.0000 g | ORAL_TABLET | Freq: Two times a day (BID) | ORAL | Status: DC
  Filled 2016-11-22: qty 1

## 2016-11-22 MED ORDER — POLYETHYLENE GLYCOL 3350 17 G PO PACK
17.0000 g | PACK | Freq: Every day | ORAL | Status: DC
  Administered 2016-11-22: 17 g via ORAL
  Filled 2016-11-22: qty 1

## 2016-11-22 MED ORDER — NICOTINE 14 MG/24HR TD PT24: 14.0000 mg | MEDICATED_PATCH | Freq: Every day | TRANSDERMAL | 0 refills | Status: AC

## 2016-11-22 NOTE — Discharge Instructions (Signed)
Please continue to take you sodium tablet and increase your diet daily.

## 2016-11-22 NOTE — Progress Notes (Signed)
Patient left without receiving AVS or prescription.  Patient left with daughter Kayla Tyler. I called [name]'s cell phone number no one answered. I called patient's home number and left a message with patient's son Elta Guadeloupe for patient to call [facility].

## 2016-11-22 NOTE — Progress Notes (Signed)
Subjective: The patient was resting comfortably in bed upon entering the room this morning. She was gently aroused from sleep before granting permission to discuss her care. She again inquired as to whether or not she could go home today to which she was informed that the more she ate, the better her odds of discharge were. She agreed to eat as much as she could this morning and at her noon meal. After a brief discussion concerning her dietary habits, she agreed to attempt to increase her daily intake. When questioned as to her thoughts regarding a potential discharge, she stated that her son could pick her up and that she would just need an advanced notice in order to contact him. She denied pain or irritation today and was resting comfortably when leaving the room. Patient denied chest pain, abdominal pain, leg pain, headache, nausea, diarrhea or constipation.   Objective:  Vital signs in last 24 hours: Vitals:   11/21/16 1745 11/21/16 2028 11/21/16 2044 11/22/16 0429  BP: (!) 145/79  (!) 146/75 135/83  Pulse: (!) 101  (!) 103 (!) 102  Resp: _0 Temp: (!) 97.4 F (36.3 C)  97.7 F (36.5 C) 97.8 F (36.6 C)  TempSrc: Oral  Oral Oral  SpO2: 100% 98% 100% 99%  Weight:   83 lb 8 oz (37.9 kg)   Height:      ROS negative except for HPI. See Above  Physical Exam  Constitutional: She is oriented to person, place, and time. No distress.  HENT:  Head: Normocephalic and atraumatic.  Cardiovascular: Normal rate, regular rhythm, normal heart sounds and intact distal pulses.   No murmur heard. Pulmonary/Chest: Effort normal and breath sounds normal.  Abdominal: Soft. Bowel sounds are normal. She exhibits no distension. There is no tenderness.  Neurological: She is alert and oriented to person, place, and time.  Skin: Skin is warm and dry. Capillary refill takes less than 2 seconds. She is not diaphoretic.  Psychiatric: She has a normal mood and affect.      Assessment/Plan:  Principal Problem:   Hyponatremia Active Problems:   Hyperlipidemia   Excessive drinking alcohol   COPD (chronic obstructive pulmonary disease) (HCC)   Essential hypertension   Tobacco use disorder   Aortic atherosclerosis (HCC)   Body mass index (BMI) 19.9 or less, adult   Severe malnutrition (HCC)   Alcohol abuse  1. Euvolemic Hypotonic Hyponatremia: Similar episode in March 2016. Patient presented with confusion in the setting of Na 110. U Na 38, Uosm 233, and serum osm 256. Workup consistent with low solute intake. Likely a combination of tea and toast syndrome and beer potomania in the setting of severe malnourishment, recent decrease in PO intake and increased alcohol intake.  - Received fluid bolus of 500cc in ED -Was started on normal saline, sodium corrected rapidly, patient started on D5W with Na leveling. Repeat BMP Na was 115 down from 117 and patient started on normal saline at 125cc/hr. Calculation revealed that the patient should be placed on 125cc/hr of normal saline in order to correct at an appropriate pace. Today, the calculation predicted 150cc/hr to continue correcting.  - NS _1 /hr at 0715 and continued this morning - Na checks q4h continued  - Na goal 230+/-1 by this evening 11/22/2016 -Plan to discharge home when sodium level reaches 130 +/- 1  2. Alcohol abuse:  - BAL 142 on admission  - Folate 20m daily + IV/PO thiamine 106m+ MVI continued  -  CIWA protocol with no ativan ordered, patient at <1  3. COPD:  - Will continue home dulera - Holding home spiriva - Duonebs q6h PRN   4. Severe malnutrition: BMI 13 - Encourage PO intake, patient refuses to eat, will pick at food according to nurse  5. HTN: chronic. Not on any antihypertensive medications at home.  - Will continue to monitor as fluids are administered   6. Falls: hyponatremia vs. alcohol intoxication  - Fall precautions   7. Tobacco use:  - Nicotine patch  40m  - Continuing to encourage smoking cessation   Dispo: Anticipated discharge in approximately 0 day(s).   HKathi Ludwig MD 11/22/2016, 7:27 AM Pager: Pager# [phone]

## 2016-11-22 NOTE — Progress Notes (Signed)
Internal Medicine Attending  Date: 11/22/2016  Patient name: Kayla Tyler Medical record number: [id number] Date of birth: [birth year] Age: 71 y.o. Gender: female  I saw and evaluated the patient. I reviewed the resident's note by Dr. Berline Lopes and I agree with the resident's findings and plans as documented in his progress note.  When seen on rounds this morning Kayla Tyler had no complaints. She was looking forward to discharge home today. Her most recent sodium was 128 which is sufficient for safe discharge. The importance of eating a normal diet was stressed.

## 2016-11-30 DIAGNOSIS — J449 Chronic obstructive pulmonary disease, unspecified: Secondary | ICD-10-CM | POA: Diagnosis not present

## 2016-12-31 DIAGNOSIS — J449 Chronic obstructive pulmonary disease, unspecified: Secondary | ICD-10-CM | POA: Diagnosis not present

## 2017-01-31 DIAGNOSIS — J449 Chronic obstructive pulmonary disease, unspecified: Secondary | ICD-10-CM | POA: Diagnosis not present

## 2017-03-02 DIAGNOSIS — J449 Chronic obstructive pulmonary disease, unspecified: Secondary | ICD-10-CM | POA: Diagnosis not present

## 2017-03-12 ENCOUNTER — Emergency Department (HOSPITAL_COMMUNITY): Payer: Medicare Other

## 2017-03-12 ENCOUNTER — Inpatient Hospital Stay (HOSPITAL_COMMUNITY)
Admission: EM | Admit: 2017-03-12 | Discharge: 2017-04-17 | DRG: 004 | Disposition: E | Payer: Medicare Other | Attending: Pulmonary Disease | Admitting: Pulmonary Disease

## 2017-03-12 ENCOUNTER — Encounter (HOSPITAL_COMMUNITY): Payer: Self-pay

## 2017-03-12 DIAGNOSIS — E860 Dehydration: Secondary | ICD-10-CM

## 2017-03-12 DIAGNOSIS — J441 Chronic obstructive pulmonary disease with (acute) exacerbation: Secondary | ICD-10-CM | POA: Diagnosis not present

## 2017-03-12 DIAGNOSIS — Z833 Family history of diabetes mellitus: Secondary | ICD-10-CM

## 2017-03-12 DIAGNOSIS — Z9289 Personal history of other medical treatment: Secondary | ICD-10-CM

## 2017-03-12 DIAGNOSIS — R4182 Altered mental status, unspecified: Secondary | ICD-10-CM

## 2017-03-12 DIAGNOSIS — K559 Vascular disorder of intestine, unspecified: Secondary | ICD-10-CM | POA: Diagnosis not present

## 2017-03-12 DIAGNOSIS — Z66 Do not resuscitate: Secondary | ICD-10-CM | POA: Diagnosis not present

## 2017-03-12 DIAGNOSIS — J9690 Respiratory failure, unspecified, unspecified whether with hypoxia or hypercapnia: Secondary | ICD-10-CM | POA: Diagnosis not present

## 2017-03-12 DIAGNOSIS — R1310 Dysphagia, unspecified: Secondary | ICD-10-CM | POA: Diagnosis not present

## 2017-03-12 DIAGNOSIS — R64 Cachexia: Secondary | ICD-10-CM | POA: Diagnosis not present

## 2017-03-12 DIAGNOSIS — E876 Hypokalemia: Secondary | ICD-10-CM | POA: Diagnosis not present

## 2017-03-12 DIAGNOSIS — Z515 Encounter for palliative care: Secondary | ICD-10-CM | POA: Diagnosis not present

## 2017-03-12 DIAGNOSIS — J13 Pneumonia due to Streptococcus pneumoniae: Secondary | ICD-10-CM | POA: Diagnosis present

## 2017-03-12 DIAGNOSIS — I959 Hypotension, unspecified: Secondary | ICD-10-CM

## 2017-03-12 DIAGNOSIS — Z8249 Family history of ischemic heart disease and other diseases of the circulatory system: Secondary | ICD-10-CM

## 2017-03-12 DIAGNOSIS — J189 Pneumonia, unspecified organism: Secondary | ICD-10-CM

## 2017-03-12 DIAGNOSIS — J440 Chronic obstructive pulmonary disease with acute lower respiratory infection: Secondary | ICD-10-CM | POA: Diagnosis present

## 2017-03-12 DIAGNOSIS — E872 Acidosis, unspecified: Secondary | ICD-10-CM

## 2017-03-12 DIAGNOSIS — N179 Acute kidney failure, unspecified: Secondary | ICD-10-CM | POA: Diagnosis present

## 2017-03-12 DIAGNOSIS — R6521 Severe sepsis with septic shock: Secondary | ICD-10-CM | POA: Diagnosis present

## 2017-03-12 DIAGNOSIS — D696 Thrombocytopenia, unspecified: Secondary | ICD-10-CM | POA: Diagnosis present

## 2017-03-12 DIAGNOSIS — K551 Chronic vascular disorders of intestine: Secondary | ICD-10-CM | POA: Diagnosis present

## 2017-03-12 DIAGNOSIS — G9341 Metabolic encephalopathy: Secondary | ICD-10-CM | POA: Diagnosis present

## 2017-03-12 DIAGNOSIS — J9811 Atelectasis: Secondary | ICD-10-CM | POA: Diagnosis not present

## 2017-03-12 DIAGNOSIS — L8990 Pressure ulcer of unspecified site, unspecified stage: Secondary | ICD-10-CM | POA: Insufficient documentation

## 2017-03-12 DIAGNOSIS — Z681 Body mass index (BMI) 19 or less, adult: Secondary | ICD-10-CM

## 2017-03-12 DIAGNOSIS — T17908A Unspecified foreign body in respiratory tract, part unspecified causing other injury, initial encounter: Secondary | ICD-10-CM | POA: Diagnosis not present

## 2017-03-12 DIAGNOSIS — D649 Anemia, unspecified: Secondary | ICD-10-CM | POA: Diagnosis present

## 2017-03-12 DIAGNOSIS — R918 Other nonspecific abnormal finding of lung field: Secondary | ICD-10-CM | POA: Diagnosis not present

## 2017-03-12 DIAGNOSIS — J918 Pleural effusion in other conditions classified elsewhere: Secondary | ICD-10-CM | POA: Diagnosis not present

## 2017-03-12 DIAGNOSIS — R680 Hypothermia, not associated with low environmental temperature: Secondary | ICD-10-CM | POA: Diagnosis present

## 2017-03-12 DIAGNOSIS — A419 Sepsis, unspecified organism: Secondary | ICD-10-CM | POA: Diagnosis not present

## 2017-03-12 DIAGNOSIS — J9601 Acute respiratory failure with hypoxia: Secondary | ICD-10-CM

## 2017-03-12 DIAGNOSIS — E43 Unspecified severe protein-calorie malnutrition: Secondary | ICD-10-CM | POA: Diagnosis present

## 2017-03-12 DIAGNOSIS — Z823 Family history of stroke: Secondary | ICD-10-CM

## 2017-03-12 DIAGNOSIS — R0902 Hypoxemia: Secondary | ICD-10-CM

## 2017-03-12 DIAGNOSIS — R579 Shock, unspecified: Secondary | ICD-10-CM | POA: Diagnosis not present

## 2017-03-12 DIAGNOSIS — E162 Hypoglycemia, unspecified: Secondary | ICD-10-CM | POA: Diagnosis not present

## 2017-03-12 DIAGNOSIS — Z781 Physical restraint status: Secondary | ICD-10-CM

## 2017-03-12 DIAGNOSIS — K5730 Diverticulosis of large intestine without perforation or abscess without bleeding: Secondary | ICD-10-CM | POA: Diagnosis not present

## 2017-03-12 DIAGNOSIS — Z930 Tracheostomy status: Secondary | ICD-10-CM | POA: Diagnosis not present

## 2017-03-12 DIAGNOSIS — R739 Hyperglycemia, unspecified: Secondary | ICD-10-CM | POA: Diagnosis not present

## 2017-03-12 DIAGNOSIS — R52 Pain, unspecified: Secondary | ICD-10-CM

## 2017-03-12 DIAGNOSIS — G9340 Encephalopathy, unspecified: Secondary | ICD-10-CM | POA: Diagnosis not present

## 2017-03-12 DIAGNOSIS — L89210 Pressure ulcer of right hip, unstageable: Secondary | ICD-10-CM | POA: Diagnosis present

## 2017-03-12 DIAGNOSIS — Z9911 Dependence on respirator [ventilator] status: Secondary | ICD-10-CM | POA: Diagnosis not present

## 2017-03-12 DIAGNOSIS — Z4682 Encounter for fitting and adjustment of non-vascular catheter: Secondary | ICD-10-CM | POA: Diagnosis not present

## 2017-03-12 DIAGNOSIS — R0603 Acute respiratory distress: Secondary | ICD-10-CM | POA: Diagnosis not present

## 2017-03-12 DIAGNOSIS — F17210 Nicotine dependence, cigarettes, uncomplicated: Secondary | ICD-10-CM | POA: Diagnosis present

## 2017-03-12 DIAGNOSIS — K6389 Other specified diseases of intestine: Secondary | ICD-10-CM | POA: Diagnosis present

## 2017-03-12 DIAGNOSIS — Z978 Presence of other specified devices: Secondary | ICD-10-CM

## 2017-03-12 DIAGNOSIS — Z853 Personal history of malignant neoplasm of breast: Secondary | ICD-10-CM

## 2017-03-12 DIAGNOSIS — R109 Unspecified abdominal pain: Secondary | ICD-10-CM

## 2017-03-12 DIAGNOSIS — E871 Hypo-osmolality and hyponatremia: Secondary | ICD-10-CM

## 2017-03-12 DIAGNOSIS — Z9012 Acquired absence of left breast and nipple: Secondary | ICD-10-CM

## 2017-03-12 DIAGNOSIS — F1010 Alcohol abuse, uncomplicated: Secondary | ICD-10-CM | POA: Diagnosis present

## 2017-03-12 DIAGNOSIS — Z4659 Encounter for fitting and adjustment of other gastrointestinal appliance and device: Secondary | ICD-10-CM

## 2017-03-12 DIAGNOSIS — R0682 Tachypnea, not elsewhere classified: Secondary | ICD-10-CM | POA: Diagnosis not present

## 2017-03-12 DIAGNOSIS — I774 Celiac artery compression syndrome: Secondary | ICD-10-CM | POA: Diagnosis present

## 2017-03-12 DIAGNOSIS — J9801 Acute bronchospasm: Secondary | ICD-10-CM | POA: Diagnosis present

## 2017-03-12 DIAGNOSIS — I700 Atherosclerosis of aorta: Secondary | ICD-10-CM | POA: Diagnosis present

## 2017-03-12 DIAGNOSIS — J9600 Acute respiratory failure, unspecified whether with hypoxia or hypercapnia: Secondary | ICD-10-CM | POA: Diagnosis not present

## 2017-03-12 DIAGNOSIS — I10 Essential (primary) hypertension: Secondary | ICD-10-CM | POA: Diagnosis present

## 2017-03-12 DIAGNOSIS — J90 Pleural effusion, not elsewhere classified: Secondary | ICD-10-CM | POA: Diagnosis not present

## 2017-03-12 DIAGNOSIS — Z452 Encounter for adjustment and management of vascular access device: Secondary | ICD-10-CM | POA: Diagnosis not present

## 2017-03-12 DIAGNOSIS — R531 Weakness: Secondary | ICD-10-CM

## 2017-03-12 DIAGNOSIS — Z430 Encounter for attention to tracheostomy: Secondary | ICD-10-CM

## 2017-03-12 DIAGNOSIS — K224 Dyskinesia of esophagus: Secondary | ICD-10-CM | POA: Diagnosis present

## 2017-03-12 DIAGNOSIS — D638 Anemia in other chronic diseases classified elsewhere: Secondary | ICD-10-CM | POA: Diagnosis not present

## 2017-03-12 LAB — CBC WITH DIFFERENTIAL/PLATELET
BASOS PCT: 0 %
Basophils Absolute: 0 10*3/uL (ref 0.0–0.1)
EOS ABS: 0 10*3/uL (ref 0.0–0.7)
EOS PCT: 0 %
HCT: 27.4 % — ABNORMAL LOW (ref 36.0–46.0)
HEMOGLOBIN: 10.1 g/dL — AB (ref 12.0–15.0)
Lymphocytes Relative: 6 %
Lymphs Abs: 0.5 10*3/uL — ABNORMAL LOW (ref 0.7–4.0)
MCH: 35.6 pg — ABNORMAL HIGH (ref 26.0–34.0)
MCHC: 36.9 g/dL — AB (ref 30.0–36.0)
MCV: 96.5 fL (ref 78.0–100.0)
MONOS PCT: 6 %
Monocytes Absolute: 0.5 10*3/uL (ref 0.1–1.0)
NEUTROS PCT: 88 %
Neutro Abs: 7.9 10*3/uL — ABNORMAL HIGH (ref 1.7–7.7)
PLATELETS: 150 10*3/uL (ref 150–400)
RBC: 2.84 MIL/uL — ABNORMAL LOW (ref 3.87–5.11)
RDW: 11.6 % (ref 11.5–15.5)
WBC: 9 10*3/uL (ref 4.0–10.5)

## 2017-03-12 LAB — COMPREHENSIVE METABOLIC PANEL
ALK PHOS: 244 U/L — AB (ref 38–126)
ALT: 58 U/L — AB (ref 14–54)
AST: 198 U/L — ABNORMAL HIGH (ref 15–41)
Albumin: 2 g/dL — ABNORMAL LOW (ref 3.5–5.0)
Anion gap: 12 (ref 5–15)
BUN: 8 mg/dL (ref 6–20)
CALCIUM: 7.2 mg/dL — AB (ref 8.9–10.3)
CO2: 23 mmol/L (ref 22–32)
CREATININE: 0.78 mg/dL (ref 0.44–1.00)
Chloride: 71 mmol/L — ABNORMAL LOW (ref 101–111)
Glucose, Bld: 61 mg/dL — ABNORMAL LOW (ref 65–99)
Potassium: 4.6 mmol/L (ref 3.5–5.1)
SODIUM: 106 mmol/L — AB (ref 135–145)
Total Bilirubin: 1.7 mg/dL — ABNORMAL HIGH (ref 0.3–1.2)
Total Protein: 5.1 g/dL — ABNORMAL LOW (ref 6.5–8.1)

## 2017-03-12 LAB — URINALYSIS, ROUTINE W REFLEX MICROSCOPIC
BILIRUBIN URINE: NEGATIVE
Glucose, UA: NEGATIVE mg/dL
HGB URINE DIPSTICK: NEGATIVE
Ketones, ur: 5 mg/dL — AB
Leukocytes, UA: NEGATIVE
NITRITE: NEGATIVE
Protein, ur: NEGATIVE mg/dL
SPECIFIC GRAVITY, URINE: 1.017 (ref 1.005–1.030)
pH: 6 (ref 5.0–8.0)

## 2017-03-12 LAB — I-STAT CG4 LACTIC ACID, ED
Lactic Acid, Venous: 6.32 mmol/L (ref 0.5–1.9)
Lactic Acid, Venous: 2.33 mmol/L (ref 0.5–1.9)

## 2017-03-12 MED ORDER — IPRATROPIUM-ALBUTEROL 0.5-2.5 (3) MG/3ML IN SOLN
3.0000 mL | Freq: Four times a day (QID) | RESPIRATORY_TRACT | Status: DC
  Administered 2017-03-13 – 2017-03-23 (×42): 3 mL via RESPIRATORY_TRACT
  Filled 2017-03-12 (×42): qty 3

## 2017-03-12 MED ORDER — SODIUM CHLORIDE 3 % IV SOLN
INTRAVENOUS | Status: DC
  Administered 2017-03-13: 50 mL/h via INTRAVENOUS
  Filled 2017-03-12 (×4): qty 500

## 2017-03-12 MED ORDER — DEXTROSE 5 % IV SOLN
2.0000 g | Freq: Once | INTRAVENOUS | Status: AC
  Administered 2017-03-12: 2 g via INTRAVENOUS
  Filled 2017-03-12: qty 2

## 2017-03-12 MED ORDER — SODIUM CHLORIDE 0.9 % IV BOLUS (SEPSIS)
2000.0000 mL | Freq: Once | INTRAVENOUS | Status: AC
  Administered 2017-03-12: 2000 mL via INTRAVENOUS

## 2017-03-12 MED ORDER — SODIUM CHLORIDE 0.9 % IV BOLUS (SEPSIS)
1000.0000 mL | Freq: Once | INTRAVENOUS | Status: AC
  Administered 2017-03-12: 1000 mL via INTRAVENOUS

## 2017-03-12 MED ORDER — KETAMINE HCL-SODIUM CHLORIDE 100-0.9 MG/10ML-% IV SOSY
2.0000 mg/kg | PREFILLED_SYRINGE | Freq: Once | INTRAVENOUS | Status: AC
  Administered 2017-03-13: 40 mg via INTRAVENOUS
  Filled 2017-03-12: qty 10

## 2017-03-12 MED ORDER — SODIUM CHLORIDE 0.9 % IV SOLN: 250.0000 mL | INTRAVENOUS | Status: DC | PRN

## 2017-03-12 MED ORDER — DEXTROSE 5 % IV SOLN
1.0000 g | Freq: Three times a day (TID) | INTRAVENOUS | Status: DC
  Administered 2017-03-13 – 2017-03-14 (×5): 1 g via INTRAVENOUS
  Filled 2017-03-12 (×7): qty 1

## 2017-03-12 MED ORDER — LEVOFLOXACIN IN D5W 750 MG/150ML IV SOLN
750.0000 mg | Freq: Once | INTRAVENOUS | Status: AC
  Administered 2017-03-12: 750 mg via INTRAVENOUS
  Filled 2017-03-12: qty 150

## 2017-03-12 MED ORDER — VANCOMYCIN HCL IN DEXTROSE 750-5 MG/150ML-% IV SOLN
750.0000 mg | Freq: Once | INTRAVENOUS | Status: AC
  Administered 2017-03-12: 750 mg via INTRAVENOUS
  Filled 2017-03-12: qty 150

## 2017-03-12 MED ORDER — VANCOMYCIN HCL IN DEXTROSE 750-5 MG/150ML-% IV SOLN
750.0000 mg | INTRAVENOUS | Status: DC
  Administered 2017-03-13: 750 mg via INTRAVENOUS
  Filled 2017-03-12: qty 150

## 2017-03-12 MED ORDER — VANCOMYCIN HCL IN DEXTROSE 1-5 GM/200ML-% IV SOLN
1000.0000 mg | Freq: Once | INTRAVENOUS | Status: DC
  Filled 2017-03-12: qty 200

## 2017-03-12 MED ORDER — SODIUM CHLORIDE 0.9 % IV SOLN
250.0000 mL | INTRAVENOUS | Status: DC | PRN
  Administered 2017-03-13: 250 mL via INTRAVENOUS

## 2017-03-12 MED ORDER — NOREPINEPHRINE BITARTRATE 1 MG/ML IV SOLN
0.0000 ug/min | INTRAVENOUS | Status: DC
  Administered 2017-03-12: 2 ug/min via INTRAVENOUS
  Filled 2017-03-12 (×2): qty 4

## 2017-03-12 MED ORDER — HEPARIN SODIUM (PORCINE) 5000 UNIT/ML IJ SOLN
5000.0000 [IU] | Freq: Three times a day (TID) | INTRAMUSCULAR | Status: DC
  Administered 2017-03-13 – 2017-03-17 (×13): 5000 [IU] via SUBCUTANEOUS
  Filled 2017-03-12 (×15): qty 1

## 2017-03-12 MED ORDER — LEVOFLOXACIN IN D5W 500 MG/100ML IV SOLN
500.0000 mg | INTRAVENOUS | Status: DC
  Administered 2017-03-13 – 2017-03-14 (×2): 500 mg via INTRAVENOUS
  Filled 2017-03-12 (×3): qty 100

## 2017-03-12 MED ORDER — VASOPRESSIN 20 UNIT/ML IV SOLN
0.0400 [IU]/min | INTRAVENOUS | Status: DC
  Administered 2017-03-12 – 2017-03-14 (×3): 0.04 [IU]/min via INTRAVENOUS
  Administered 2017-03-15: 0.03 [IU]/min via INTRAVENOUS
  Administered 2017-03-16 – 2017-03-22 (×8): 0.04 [IU]/min via INTRAVENOUS
  Filled 2017-03-12 (×12): qty 2

## 2017-03-12 NOTE — Progress Notes (Signed)
Pharmacy Antibiotic Note  Kayla Tyler is a 71 y.o. female admitted on 02/24/2017 with sepsis.  Pharmacy has been consulted for atreonam, levofloxacin and vancomycin dosing.  WBC 9.0, LA 6.32, Afebrile. Scr 0.78 (Baseline 0.4).   Plan: Vancomycin 750mg  IVX1 then vancomycin 750mg  IV every 24 hours.  Levofloxacin 750mg  IV X1 then 500mg  IV every 24 hours.  Aztreonam 2g Iv X1 then 1g IV every 8 hours.  Monitor for Scr, c/s, clinical resolution. F/u de-escalation plan/LOT, vancomycin trough as indicated   Height: 5' (152.4 cm) Weight: 85 lb (38.6 kg) IBW/kg (Calculated) : 45.5  Temp (24hrs), Avg:88.1 F (31.2 C), Min:88.1 F (31.2 C), Max:88.1 F (31.2 C)   Recent Labs Lab 03/01/2017 1945 02/26/2017 1959  WBC 9.0  --   LATICACIDVEN  --  6.32*    CrCl cannot be calculated (Patient's most recent lab result is older than the maximum 21 days allowed.).    Allergies  Allergen Reactions  . Lisinopril Swelling    Lip swelling (reaction to lisinopril/hctz)  . Penicillins Other (See Comments)    Pt passes out. Has patient had a PCN reaction causing immediate rash, facial/tongue/throat swelling, SOB or lightheadedness with hypotension: No Has patient had a PCN reaction causing severe rash involving mucus membranes or skin necrosis: No Has patient had a PCN reaction that required hospitalization yes Has patient had a PCN reaction occurring within the last 10 years: No If all of the above answers are "NO", then may proceed with Cephalosporin use.    Antimicrobials this admission: Vancomycin 10/26 >>  Aztreonam 10/26 >>  Levofloxacin 10/26>>  Microbiology results: 10/26 BCx:    Thank you for allowing pharmacy to be a part of this patient's care.  Jerrye Noble, PharmD Candidate  03/05/2017 8:14 PM

## 2017-03-12 NOTE — H&P (Signed)
Reason for ICU admission: altered mental status and acute hypoxic resp failure   In summary: 71 yo F presented to the ED because of altered mental status. Patient is poor historian, so the hx was taken from the records and her daughter. Patient had decreased oral intake for the las few days, however she was alert and interactive. Today she was confused, EMS called and found the patient to be hypothermic, bradycardiac and hypotensive. Patient intubated in the ED for airway protection.   On my assessment: T 93.2, BP 92/54 on norepi, HR 66, RR 25, O2sat 99% on facemask. Not alert and does not follow commands, not able to proect airways S1S2 brady, no murmur Diminished at the bases, no wheeze Abdomen soft and not tender Cold extremities, detected pulses   I reviewed the labs and CXR  Patient is critically ill in the ICU and I am managing the patient for the following  1. Encephalopathy: multi-factorial septic shock, hyponatremia. Will send further work up for hypothyroidism, ammonia and also will do CT scan of the head. 2. Acute hypoxic resp failure: intubated for airway protection, check ABGs and CXR. Adjust vent settings 3. Shock: hypovolemic and septic: send cultures, fluid resuscitation, board abx. Follow lactic acid trends. Pan scan  4. Hyponatremia: gentle hydration and follow sodium trend q2hrs 5. Lactic acidosis as above   I spent 45 min of CC time managing the patient  Samul Dada, MD CCM attending

## 2017-03-12 NOTE — ED Triage Notes (Signed)
Pt arrives to ED from home via EMS with complaints of generalized weakness and altered mental status x3 days. Pt nonverbal upon arrival, responds to pain. Bedbugs found in the house, per EMS. Code sepsis called, pt's rectal temp 88.; warm blankets laced on pt and bear hugger ordered. Pt placed in position of comfort with bed locked and lowered, call bell in reach. MD and resident bedside for evaluation

## 2017-03-12 NOTE — ED Notes (Addendum)
Bear hugger placed on pt, respiratory bedside to obtain ABG (unsuccessful attempt x2). Daughter just arrived, MD updating on plan of care. Daughter stating pt would want to be full code until further notice. Pt placed on nonrebreather, levophed initiated in peripheral IV until central line can be obtained. Pt has reportedly not been feeling good over the last few days and has not been eating.

## 2017-03-12 NOTE — ED Provider Notes (Signed)
[facility] EMERGENCY DEPARTMENT Provider Note   CSN: [id number] Arrival date & time: 02/22/2017  1853  History   Chief Complaint Chief Complaint  Patient presents with  . Weakness  . Altered Mental Status   HPI Kayla Tyler is a 71 y.o. female who presented via EMS.  The history is provided by the EMS personnel. The history is limited by the condition of the patient.  Altered Mental Status   This is a new problem. The current episode started more than 2 days ago. The problem has been gradually worsening. Associated symptoms include confusion, somnolence, unresponsiveness and weakness. Her past medical history is significant for hypertension and COPD.  Reportedly the patient's had not been eating or drinking for the past 3 day. EMS states that family reports that the patient had not been feeling well and had become progressively more weak over the past 3 days. They tried to bring her in to the hospital for evaluation but the patient kept refusing to come.   Past Medical History:  Diagnosis Date  . Breast cancer ([place])   . COPD (chronic obstructive pulmonary disease) ([place])   . ETOH abuse   . Hypertension   . Tobacco abuse    Patient Active Problem List   Diagnosis Date Noted  . Severe malnutrition ([place])   . Alcohol abuse   . Vitamin D deficiency 04/20/2016  . Aortic atherosclerosis ([place]) 03/26/2016  . Body mass index (BMI) 19.9 or less, adult 03/26/2016  . Preventative health care 02/05/2015  . Postmenopausal estrogen deficiency 02/05/2015  . Tobacco use disorder 08/21/2014  . Elevated LFTs 08/21/2014  . Hyponatremia 08/12/2014  . Essential hypertension 06/22/2014  . Excessive drinking alcohol 04/30/2013  . COPD (chronic obstructive pulmonary disease) ([place]) 04/30/2013  . History of breast cancer 04/30/2013  . Hyperlipidemia 07/15/2006   Past Surgical History:  Procedure Laterality Date  . MASTECTOMY Left    OB History    No data available     Home  Medications    Prior to Admission medications   Medication Sig Start Date End Date Taking? Authorizing Provider  folic acid (FOLVITE) 1 MG tablet Take 1 tablet (1 mg total) by mouth daily. Patient not taking: Reported on 03/25/2016 02/05/15   Rivet, Sindy Guadeloupe, MD  levalbuterol [facility] HFA) 45 MCG/ACT inhaler Inhale 2 puffs into the lungs every 4 (four) hours as needed for wheezing. Patient not taking: Reported on 03/25/2016 02/05/15   Rivet, Sindy Guadeloupe, MD  mometasone-formoterol (DULERA) 200-5 MCG/ACT AERO Inhale 2 puffs into the lungs 2 (two) times daily. 03/26/16   Burgess Estelle, MD  Multiple Vitamin (MULTIVITAMIN WITH MINERALS) TABS tablet Take 1 tablet by mouth daily. 11/23/16   [name], MD  nicotine (NICODERM CQ - DOSED IN MG/24 HOURS) 14 mg/24hr patch Place 1 patch (14 mg total) onto the skin daily. 11/23/16   [name], MD  sodium chloride 1 g tablet Take 1 tablet (1 g total) by mouth 3 (three) times daily with meals. 11/22/16   [name], MD  thiamine 100 MG tablet Take 1 tablet (100 mg total) by mouth daily. Patient not taking: Reported on 03/25/2016 02/05/15   Rivet, Sindy Guadeloupe, MD  tiotropium (SPIRIVA HANDIHALER) 18 MCG inhalation capsule Place 1 capsule (18 mcg total) into inhaler and inhale daily. 03/26/16   Burgess Estelle, MD  Vitamin D, Ergocalciferol, (DRISDOL) 50000 units CAPS capsule Take 1 capsule (50,000 Units total) by mouth every 7 (seven) days. Patient not taking: Reported on  11/18/2016 04/20/16   Asencion Partridge, MD   Family History Family History  Problem Relation Age of Onset  . Heart attack Father 26  . Stroke Sister 30  . Diabetes Sister   . Hypertension Brother    Social History Social History  Substance Use Topics  . Smoking status: Current Every Day Smoker    Packs/day: 0.50    Years: 48.00    Types: Cigarettes  . Smokeless tobacco: Never Used     Comment: cutting back 3/4 pack. 1/2 pack  . Alcohol use 19.2 oz/week    32 Cans of beer per week     Allergies   Lisinopril and Penicillins  Review of Systems Review of Systems  Unable to perform ROS: Acuity of condition  Neurological: Positive for weakness.  Psychiatric/Behavioral: Positive for confusion.   Physical Exam Updated Vital Signs BP (!) 69/44   Pulse (!) 48   Temp (!) 88.1 F (31.2 C) (Rectal)   Resp (!) 27   Ht 5' (1.524 m)   Wt 38.6 kg (85 lb)   SpO2 94%   BMI 16.60 kg/m   Physical Exam  Constitutional: She appears lethargic. She appears cachectic. She is uncooperative. She appears toxic. She appears ill. She appears distressed.  HENT:  Head: Normocephalic and atraumatic.  Mouth/Throat: Mucous membranes are dry.  Eyes: Conjunctivae are normal.  Neck: Neck supple.  Cardiovascular: Regular rhythm.  Bradycardia present.   No murmur heard. Pulmonary/Chest: Effort normal. No respiratory distress.  Abdominal: Soft. She exhibits no distension.  Musculoskeletal: Normal range of motion. She exhibits no edema or deformity.  Neurological: She appears lethargic. She displays atrophy.  Skin:  Skin is cool to touch  Psychiatric: She has a normal mood and affect.  Nursing note and vitals reviewed.  ED Treatments / Results  Labs (all labs ordered are listed, but only abnormal results are displayed) Labs Reviewed  I-STAT CG4 LACTIC ACID, ED - Abnormal; Notable for the following:       Result Value   Lactic Acid, Venous 6.32 (*)    All other components within normal limits  CULTURE, BLOOD (ROUTINE X 2)  CULTURE, BLOOD (ROUTINE X 2)  URINE CULTURE  COMPREHENSIVE METABOLIC PANEL  CBC WITH DIFFERENTIAL/PLATELET  URINALYSIS, ROUTINE W REFLEX MICROSCOPIC  I-STAT CHEM 8, ED    EKG  EKG Interpretation  Date/Time:  Friday March 12 2017 18:58:13 EDT Ventricular Rate:  43 PR Interval:    QRS Duration: 164 QT Interval:  540 QTC Calculation: 457 R Axis:   84 Text Interpretation:  new Sinus bradycardia Probable left ventricular hypertrophy No significant  change since last tracing Confirmed by Blanchie Dessert [phone]) on 03/14/2017 7:05:56 PM      Radiology No results found.  Procedures Procedure Name: Intubation Date/Time: 03/13/2017 12:09 AM Performed by: Fenton Foy Pre-anesthesia Checklist: Suction available, Patient being monitored, Emergency Drugs available and Patient identified Oxygen Delivery Method: Non-rebreather mask Preoxygenation: Pre-oxygenation with 100% oxygen Induction Type: IV induction Laryngoscope Size: Mac, Glidescope and 3 Tube type: Subglottic suction tube Tube size: 7.5 mm Number of attempts: 1 Airway Equipment and Method: Video-laryngoscopy Placement Confirmation: ETT inserted through vocal cords under direct vision,  Positive ETCO2,  CO2 detector and Breath sounds checked- equal and bilateral Secured at: 25 cm Tube secured with: ETT holder Difficulty Due To: Difficulty was anticipated Future Recommendations: Recommend- induction with short-acting agent, and alternative techniques readily available    .Central Line Date/Time: 03/13/2017 12:12 AM Performed by: Fenton Foy Authorized by: Levonne Lapping,  Liboria Putnam   Consent:    Consent obtained:  Emergent situation Pre-procedure details:    Hand hygiene: Hand hygiene performed prior to insertion     Sterile barrier technique: All elements of maximal sterile technique followed     Skin preparation:  2% chlorhexidine   Skin preparation agent: Skin preparation agent completely dried prior to procedure   Anesthesia (see MAR for exact dosages):    Anesthesia method:  None Procedure details:    Location:  R internal jugular   Patient position:  Flat   Procedural supplies:  Triple lumen   Landmarks identified: yes     Ultrasound guidance: yes     Sterile ultrasound techniques: Sterile gel and sterile probe covers were used     Number of attempts:  1   Successful placement: yes   Post-procedure details:    Post-procedure:  Dressing applied and line sutured    Assessment:  Blood return through all ports, free fluid flow, no pneumothorax on x-ray and placement verified by x-ray   Patient tolerance of procedure:  Tolerated well, no immediate complications   (including critical care time)  Medications Ordered in ED Medications  norepinephrine (LEVOPHED) 4 mg in dextrose 5 % 250 mL (0.016 mg/mL) infusion (2 mcg/min Intravenous New Bag/Given 02/20/2017 1917)  levofloxacin (LEVAQUIN) IVPB 750 mg (not administered)  aztreonam (AZACTAM) 2 g in dextrose 5 % 50 mL IVPB (not administered)  vancomycin (VANCOCIN) IVPB 1000 mg/200 mL premix (not administered)    Initial Impression / Assessment and Plan / ED Course  I have reviewed the triage vital signs and the nursing notes.  Pertinent labs & imaging results that were available during my care of the patient were reviewed by me and considered in my medical decision making (see chart for details).  Pt is a 71 y.o. female with PMHX of breast CA who presents w/ altered mental status and hypotnesion.   Upon arrival the patient was quickly assessed and details were provided by EMS.  The patient appeared lethargic and mildly combative.  Vitals were noted to be: bradycardic with HR initially in the 40's and then improved into the 50-60's, hypotension, and hypothermia with temp of 9F.   Nursing staff had difficulty with IV access however were able to secure access and draw lab studies. Warm fluids were started and bear hugger was placed on the patient   Due to vital sign abnormality and poor mental status code sepsis was called immediately due to concerns for septic shock. Labs, cultures, IVFs, broad spectrum antibiotics ordered. Blood cultures x2 were drawn prior to infusion of abx.  The patient's daughter arrived and stated that though her mother would not want to be on a ventilator that she would like everything possible done to save her life.   Levophed was started via peripheral IV for hypotension. Despite max  doses of levophed (15) peripherally the patient remained hypotension and a central line was placed  Pertinent lab findings include: hyponatremia Na 106, elevated LFT's AST 198, ALT 58, ALK 244, Tbili 1.7, CBC without leukocytosis, anemia Hgb, 10.1, normal plt, Lactic acid 6.32 Imaging: CT head: No acute intracranial process  Due to continued depressed mental status and GCS < 8 the patient was intubated for airway protection with ketamine.   Critical care team contacted for evaluation and triage of patient for admission. Labs and imaging reviewed by myself and considered in medical decision making if ordered. Imaging interpreted by radiology and reviewed by treatment team in the ED.  Patient admitted to ICU in stable condition.   Final Clinical Impressions(s) / ED Diagnoses   Final diagnoses:  Altered mental status, unspecified altered mental status type  Hyponatremia  Dehydration  Generalized weakness  Hypotension, unspecified hypotension type  Shock ([place])  Lactic acidosis   New Prescriptions New Prescriptions   No medications on file     Fenton Foy, MD 03/13/17 2216    Blanchie Dessert, MD 03/15/17 [phone]

## 2017-03-13 ENCOUNTER — Inpatient Hospital Stay (HOSPITAL_COMMUNITY): Payer: Medicare Other

## 2017-03-13 DIAGNOSIS — J9600 Acute respiratory failure, unspecified whether with hypoxia or hypercapnia: Secondary | ICD-10-CM

## 2017-03-13 DIAGNOSIS — N179 Acute kidney failure, unspecified: Secondary | ICD-10-CM

## 2017-03-13 LAB — POCT I-STAT 3, ART BLOOD GAS (G3+)
ACID-BASE DEFICIT: 1 mmol/L (ref 0.0–2.0)
ACID-BASE DEFICIT: 6 mmol/L — AB (ref 0.0–2.0)
ACID-BASE DEFICIT: 5 mmol/L — AB (ref 0.0–2.0)
Acid-base deficit: 8 mmol/L — ABNORMAL HIGH (ref 0.0–2.0)
BICARBONATE: 25.4 mmol/L (ref 20.0–28.0)
BICARBONATE: 20.4 mmol/L (ref 20.0–28.0)
Bicarbonate: 22.8 mmol/L (ref 20.0–28.0)
Bicarbonate: 18.3 mmol/L — ABNORMAL LOW (ref 20.0–28.0)
O2 SAT: 88 %
O2 SAT: 86 %
O2 SAT: 92 %
O2 Saturation: 94 %
PCO2 ART: 49.6 mmHg — AB (ref 32.0–48.0)
PCO2 ART: 36.4 mmHg (ref 32.0–48.0)
PH ART: 7.112 — AB (ref 7.350–7.450)
PO2 ART: 57 mmHg — AB (ref 83.0–108.0)
PO2 ART: 67 mmHg — AB (ref 83.0–108.0)
Patient temperature: 98.6
Patient temperature: 37.2
TCO2: 25 mmol/L (ref 22–32)
TCO2: 27 mmol/L (ref 22–32)
TCO2: 19 mmol/L — ABNORMAL LOW (ref 22–32)
TCO2: 21 mmol/L — ABNORMAL LOW (ref 22–32)
pCO2 arterial: 70.1 mmHg (ref 32.0–48.0)
pCO2 arterial: 30.4 mmHg — ABNORMAL LOW (ref 32.0–48.0)
pH, Arterial: 7.318 — ABNORMAL LOW (ref 7.350–7.450)
pH, Arterial: 7.387 (ref 7.350–7.450)
pH, Arterial: 7.358 (ref 7.350–7.450)
pO2, Arterial: 69 mmHg — ABNORMAL LOW (ref 83.0–108.0)
pO2, Arterial: 69 mmHg — ABNORMAL LOW (ref 83.0–108.0)

## 2017-03-13 LAB — CBC
HCT: 25.1 % — ABNORMAL LOW (ref 36.0–46.0)
HEMOGLOBIN: 9.1 g/dL — AB (ref 12.0–15.0)
MCH: 35.1 pg — AB (ref 26.0–34.0)
MCHC: 36.3 g/dL — ABNORMAL HIGH (ref 30.0–36.0)
MCV: 96.9 fL (ref 78.0–100.0)
PLATELETS: 157 10*3/uL (ref 150–400)
RBC: 2.59 MIL/uL — AB (ref 3.87–5.11)
RDW: 11.4 % — ABNORMAL LOW (ref 11.5–15.5)
WBC: 10.3 10*3/uL (ref 4.0–10.5)

## 2017-03-13 LAB — BASIC METABOLIC PANEL
ANION GAP: 7 (ref 5–15)
ANION GAP: 9 (ref 5–15)
Anion gap: 8 (ref 5–15)
BUN: 11 mg/dL (ref 6–20)
BUN: 15 mg/dL (ref 6–20)
BUN: 14 mg/dL (ref 6–20)
CALCIUM: 6.5 mg/dL — AB (ref 8.9–10.3)
CALCIUM: 6.6 mg/dL — AB (ref 8.9–10.3)
CHLORIDE: 77 mmol/L — AB (ref 101–111)
CHLORIDE: 88 mmol/L — AB (ref 101–111)
CO2: 24 mmol/L (ref 22–32)
CO2: 20 mmol/L — AB (ref 22–32)
CO2: 22 mmol/L (ref 22–32)
CREATININE: 0.98 mg/dL (ref 0.44–1.00)
Calcium: 6.3 mg/dL — CL (ref 8.9–10.3)
Chloride: 88 mmol/L — ABNORMAL LOW (ref 101–111)
Creatinine, Ser: 0.73 mg/dL (ref 0.44–1.00)
Creatinine, Ser: 1.11 mg/dL — ABNORMAL HIGH (ref 0.44–1.00)
GFR calc Af Amer: 56 mL/min — ABNORMAL LOW (ref >60)
GFR, EST NON AFRICAN AMERICAN: 56 mL/min — AB (ref >60)
GFR, EST NON AFRICAN AMERICAN: 48 mL/min — AB (ref >60)
GLUCOSE: 107 mg/dL — AB (ref 65–99)
Glucose, Bld: 126 mg/dL — ABNORMAL HIGH (ref 65–99)
Glucose, Bld: 98 mg/dL (ref 65–99)
POTASSIUM: 3.9 mmol/L (ref 3.5–5.1)
POTASSIUM: 3.4 mmol/L — AB (ref 3.5–5.1)
Potassium: 3.4 mmol/L — ABNORMAL LOW (ref 3.5–5.1)
SODIUM: 108 mmol/L — AB (ref 135–145)
SODIUM: 117 mmol/L — AB (ref 135–145)
Sodium: 118 mmol/L — CL (ref 135–145)

## 2017-03-13 LAB — SODIUM, URINE, RANDOM

## 2017-03-13 LAB — BLOOD CULTURE ID PANEL (REFLEXED)
ACINETOBACTER BAUMANNII: NOT DETECTED
CANDIDA PARAPSILOSIS: NOT DETECTED
Candida albicans: NOT DETECTED
Candida glabrata: NOT DETECTED
Candida krusei: NOT DETECTED
Candida tropicalis: NOT DETECTED
ENTEROBACTER CLOACAE COMPLEX: NOT DETECTED
ESCHERICHIA COLI: NOT DETECTED
Enterobacteriaceae species: NOT DETECTED
Enterococcus species: NOT DETECTED
Haemophilus influenzae: NOT DETECTED
KLEBSIELLA OXYTOCA: NOT DETECTED
Klebsiella pneumoniae: NOT DETECTED
LISTERIA MONOCYTOGENES: NOT DETECTED
Methicillin resistance: NOT DETECTED
Neisseria meningitidis: NOT DETECTED
PSEUDOMONAS AERUGINOSA: NOT DETECTED
Proteus species: NOT DETECTED
SERRATIA MARCESCENS: NOT DETECTED
STAPHYLOCOCCUS AUREUS BCID: NOT DETECTED
STREPTOCOCCUS AGALACTIAE: NOT DETECTED
STREPTOCOCCUS PNEUMONIAE: NOT DETECTED
STREPTOCOCCUS PYOGENES: NOT DETECTED
Staphylococcus species: DETECTED — AB
Streptococcus species: NOT DETECTED

## 2017-03-13 LAB — T4, FREE: FREE T4: 0.92 ng/dL (ref 0.61–1.12)

## 2017-03-13 LAB — OSMOLALITY, URINE: OSMOLALITY UR: 410 mosm/kg (ref 300–900)

## 2017-03-13 LAB — RAPID URINE DRUG SCREEN, HOSP PERFORMED
AMPHETAMINES: NOT DETECTED
Barbiturates: NOT DETECTED
Benzodiazepines: POSITIVE — AB
Cocaine: NOT DETECTED
OPIATES: NOT DETECTED
TETRAHYDROCANNABINOL: NOT DETECTED

## 2017-03-13 LAB — GLUCOSE, CAPILLARY
GLUCOSE-CAPILLARY: 128 mg/dL — AB (ref 65–99)
Glucose-Capillary: 97 mg/dL (ref 65–99)

## 2017-03-13 LAB — MRSA PCR SCREENING: MRSA BY PCR: NEGATIVE

## 2017-03-13 LAB — TSH: TSH: 0.72 u[IU]/mL (ref 0.350–4.500)

## 2017-03-13 LAB — AMMONIA: AMMONIA: 134 umol/L — AB (ref 9–35)

## 2017-03-13 LAB — LACTIC ACID, PLASMA
Lactic Acid, Venous: 2.1 mmol/L (ref 0.5–1.9)
Lactic Acid, Venous: 5 mmol/L (ref 0.5–1.9)
Lactic Acid, Venous: 3.3 mmol/L (ref 0.5–1.9)

## 2017-03-13 LAB — AMYLASE: Amylase: 1989 U/L — ABNORMAL HIGH (ref 28–100)

## 2017-03-13 LAB — LIPASE, BLOOD: Lipase: 33 U/L (ref 11–51)

## 2017-03-13 LAB — PROCALCITONIN: Procalcitonin: 2.9 ng/mL

## 2017-03-13 LAB — OSMOLALITY: Osmolality: 240 mOsm/kg — CL (ref 275–295)

## 2017-03-13 MED ORDER — MIDAZOLAM HCL 2 MG/2ML IJ SOLN
INTRAMUSCULAR | Status: AC
  Administered 2017-03-13: 1 mg via INTRAVENOUS
  Filled 2017-03-13: qty 2

## 2017-03-13 MED ORDER — LACTATED RINGERS IV BOLUS (SEPSIS)
1000.0000 mL | Freq: Once | INTRAVENOUS | Status: AC
  Administered 2017-03-13: 1000 mL via INTRAVENOUS

## 2017-03-13 MED ORDER — FENTANYL CITRATE (PF) 100 MCG/2ML IJ SOLN
INTRAMUSCULAR | Status: AC
  Administered 2017-03-13: 50 ug via INTRAVENOUS
  Filled 2017-03-13: qty 2

## 2017-03-13 MED ORDER — FENTANYL CITRATE (PF) 100 MCG/2ML IJ SOLN
50.0000 ug | INTRAMUSCULAR | Status: DC | PRN
  Administered 2017-03-14 – 2017-03-15 (×2): 50 ug via INTRAVENOUS
  Filled 2017-03-13 (×2): qty 2

## 2017-03-13 MED ORDER — PRO-STAT SUGAR FREE PO LIQD: 30.0000 mL | Freq: Two times a day (BID) | ORAL | Status: DC

## 2017-03-13 MED ORDER — PHENYLEPHRINE HCL 10 MG/ML IJ SOLN
0.0000 ug/min | INTRAMUSCULAR | Status: DC
  Administered 2017-03-13: 20 ug/min via INTRAVENOUS
  Filled 2017-03-13: qty 1

## 2017-03-13 MED ORDER — GERHARDT'S BUTT CREAM
TOPICAL_CREAM | Freq: Three times a day (TID) | CUTANEOUS | Status: DC
  Administered 2017-03-13 – 2017-03-14 (×5): via TOPICAL
  Administered 2017-03-14: 1 via TOPICAL
  Administered 2017-03-15 (×2): via TOPICAL
  Administered 2017-03-15 – 2017-03-16 (×2): 1 via TOPICAL
  Administered 2017-03-16 – 2017-03-19 (×10): via TOPICAL
  Administered 2017-03-19 – 2017-03-20 (×3): 1 via TOPICAL
  Administered 2017-03-20 – 2017-03-21 (×2): via TOPICAL
  Administered 2017-03-21 (×2): 1 via TOPICAL
  Administered 2017-03-22 (×3): via TOPICAL
  Filled 2017-03-13 (×3): qty 1

## 2017-03-13 MED ORDER — SODIUM BICARBONATE 8.4 % IV SOLN
100.0000 meq | Freq: Once | INTRAVENOUS | Status: AC
  Administered 2017-03-13: 100 meq via INTRAVENOUS
  Filled 2017-03-13: qty 50

## 2017-03-13 MED ORDER — PANTOPRAZOLE SODIUM 40 MG IV SOLR
40.0000 mg | INTRAVENOUS | Status: DC
  Administered 2017-03-13 – 2017-03-15 (×3): 40 mg via INTRAVENOUS
  Filled 2017-03-13 (×4): qty 40

## 2017-03-13 MED ORDER — MIDAZOLAM HCL 2 MG/2ML IJ SOLN
1.0000 mg | INTRAMUSCULAR | Status: DC | PRN
  Administered 2017-03-13: 1 mg via INTRAVENOUS

## 2017-03-13 MED ORDER — SODIUM CHLORIDE 0.9 % IV SOLN
2.0000 g | Freq: Once | INTRAVENOUS | Status: AC
  Administered 2017-03-13: 2 g via INTRAVENOUS
  Filled 2017-03-13: qty 20

## 2017-03-13 MED ORDER — ORAL CARE MOUTH RINSE
15.0000 mL | Freq: Four times a day (QID) | OROMUCOSAL | Status: DC
  Administered 2017-03-13 – 2017-03-23 (×41): 15 mL via OROMUCOSAL

## 2017-03-13 MED ORDER — CHLORHEXIDINE GLUCONATE 0.12% ORAL RINSE (MEDLINE KIT)
15.0000 mL | Freq: Two times a day (BID) | OROMUCOSAL | Status: DC
  Administered 2017-03-13 – 2017-03-23 (×21): 15 mL via OROMUCOSAL

## 2017-03-13 MED ORDER — MIDAZOLAM HCL 2 MG/2ML IJ SOLN
1.0000 mg | INTRAMUSCULAR | Status: DC | PRN
  Administered 2017-03-17 – 2017-03-21 (×4): 1 mg via INTRAVENOUS
  Filled 2017-03-13 (×5): qty 2

## 2017-03-13 MED ORDER — SODIUM CHLORIDE 0.9 % IV BOLUS (SEPSIS)
500.0000 mL | Freq: Once | INTRAVENOUS | Status: AC
  Administered 2017-03-13: 500 mL via INTRAVENOUS

## 2017-03-13 MED ORDER — VITAL AF 1.2 CAL PO LIQD
1000.0000 mL | ORAL | Status: DC
  Administered 2017-03-13 – 2017-03-20 (×7): 1000 mL
  Filled 2017-03-13: qty 1000

## 2017-03-13 MED ORDER — LACTATED RINGERS IV SOLN
INTRAVENOUS | Status: DC
  Administered 2017-03-13 – 2017-03-15 (×3): via INTRAVENOUS

## 2017-03-13 MED ORDER — NOREPINEPHRINE BITARTRATE 1 MG/ML IV SOLN
0.0000 ug/min | INTRAVENOUS | Status: DC
  Administered 2017-03-13: 32 ug/min via INTRAVENOUS
  Administered 2017-03-13: 15 ug/min via INTRAVENOUS
  Administered 2017-03-14: 20 ug/min via INTRAVENOUS
  Administered 2017-03-14: 16 ug/min via INTRAVENOUS
  Administered 2017-03-15: 30 ug/min via INTRAVENOUS
  Administered 2017-03-15: 24 ug/min via INTRAVENOUS
  Administered 2017-03-16: 18 ug/min via INTRAVENOUS
  Administered 2017-03-16: 30 ug/min via INTRAVENOUS
  Administered 2017-03-17: 50 ug/min via INTRAVENOUS
  Administered 2017-03-17: 14 ug/min via INTRAVENOUS
  Administered 2017-03-18: 30 ug/min via INTRAVENOUS
  Administered 2017-03-18: 50 ug/min via INTRAVENOUS
  Administered 2017-03-19: 20 ug/min via INTRAVENOUS
  Administered 2017-03-20: 13 ug/min via INTRAVENOUS
  Administered 2017-03-21: 12 ug/min via INTRAVENOUS
  Administered 2017-03-22: 50 ug/min via INTRAVENOUS
  Administered 2017-03-22: 20 ug/min via INTRAVENOUS
  Administered 2017-03-22: 40 ug/min via INTRAVENOUS
  Administered 2017-03-23 (×3): 50 ug/min via INTRAVENOUS
  Filled 2017-03-13 (×25): qty 16

## 2017-03-13 MED ORDER — KETAMINE HCL-SODIUM CHLORIDE 100-0.9 MG/10ML-% IV SOSY
2.0000 mg/kg | PREFILLED_SYRINGE | Freq: Once | INTRAVENOUS | Status: AC
  Administered 2017-03-13: 40 mg via INTRAVENOUS

## 2017-03-13 MED ORDER — FENTANYL CITRATE (PF) 100 MCG/2ML IJ SOLN
50.0000 ug | INTRAMUSCULAR | Status: DC | PRN
  Administered 2017-03-13: 50 ug via INTRAVENOUS

## 2017-03-13 MED ORDER — SODIUM CHLORIDE 0.9 % IV SOLN
0.0000 ug/min | INTRAVENOUS | Status: DC
  Administered 2017-03-13: 100 ug/min via INTRAVENOUS
  Administered 2017-03-13: 120 ug/min via INTRAVENOUS
  Administered 2017-03-13: 100 ug/min via INTRAVENOUS
  Filled 2017-03-13 (×5): qty 4

## 2017-03-13 NOTE — Progress Notes (Addendum)
Initial Nutrition Assessment  DOCUMENTATION CODES:   Severe malnutrition in context of chronic illness, Underweight  INTERVENTION:    Vital AF 1.2 at 45 ml/h (1080 ml per day)  Provides 1296 kcal, 81 gm protein, 876 ml free water daily  NUTRITION DIAGNOSIS:   Severe Malnutrition related to chronic illness (COPD) as evidenced by severe fat depletion, severe muscle depletion.  GOAL:   Patient will meet greater than or equal to 90% of their needs  MONITOR:   Vent status, Skin, Labs, I & O's  REASON FOR ASSESSMENT:   Ventilator, Consult Enteral/tube feeding initiation and management  ASSESSMENT:   71 yo female with PMH of breast cancer, COPD, HTN, ETOH abuse, tobacco abuse who was admitted on 10/26 with AMS and acute hypoxic respiratory failure requiring intubation.   Received MD Consult for TF initiation and management. Spoke with patient's family member (daughter) at bedside. She reports that patient has always been small, but has lost even more weight, down to the 60's, over the past 2 months. From weight encounters in EMR, patient's weight has ranged from 75-87 lbs within the past year. Patient is currently intubated on ventilator support MV: 12.6 L/min Temp (24hrs), Avg:96 F (35.6 C), Min:88.1 F (31.2 C), Max:99.3 F (37.4 C)   Labs reviewed. Sodium 108 (L) CBG's: 128-97 Medications reviewed.   I/O + 5.3 L since admission.  NUTRITION - FOCUSED PHYSICAL EXAM:    Most Recent Value  Orbital Region  Moderate depletion  Upper Arm Region  Severe depletion  Thoracic and Lumbar Region  Severe depletion  Buccal Region  Unable to assess  Temple Region  Moderate depletion  Clavicle Bone Region  Severe depletion  Clavicle and Acromion Bone Region  Severe depletion  Scapular Bone Region  Unable to assess  Dorsal Hand  Moderate depletion  Patellar Region  Severe depletion  Anterior Thigh Region  Severe depletion  Posterior Calf Region  Severe depletion  Edema (RD  Assessment)  None  Hair  Reviewed  Eyes  Reviewed  Mouth  Unable to assess  Skin  Reviewed  Nails  Reviewed       Diet Order:  Diet NPO time specified  EDUCATION NEEDS:   No education needs have been identified at this time  Skin:  Skin Assessment: Skin Integrity Issues: Skin Integrity Issues:: Unstageable, Other (Comment) Unstageable: Right hip Other: MASD buttocks, trauma injury to R elbow  Last BM:  unknown  Height:   Ht Readings from Last 1 Encounters:  03/13/17 5' (1.524 m)    Weight:   Wt Readings from Last 1 Encounters:  03/13/17 87 lb 11.9 oz (39.8 kg)    Ideal Body Weight:  45.5 kg  BMI:  Body mass index is 17.14 kg/m.  Estimated Nutritional Needs:   Kcal:  1225  Protein:  70-80 gm  Fluid:  1.5 L    Molli Barrows, RD, LDN, [place] Pager [phone] After Hours Pager [phone]

## 2017-03-13 NOTE — Consult Note (Signed)
[facility] Nurse wound consult note Reason for Consult: Skin breakdown on right hip, bilateral buttocks and right elbow Wound type:Right hip is Pressure (Unstageable), right elbow is trauma, bilateral buttocks are due to moisture associated skin damage, specifically incontinence associated dermatitis Pressure Injury POA: Yes, right hip Measurement: Right hip:  3cm x 5cm with depth undetermined due to the presence of dark nonviable tissue that is sloughing off. Small, scattered reas of pink tissue are revealed beneath the nonviable tissue.  Right elbow: partial thickness tissue loss in the process of reepithelialization measuring 2cm x 1.6cm x 0.1cm.  Pink, moist with islands of pigmented epithelial tissue evident. Bilateral buttocks with both full and partial thickness tissue loss, faint satellite lesions noted at the distal portion of the right buttocks consistent with mild fungal overgrowth. Total area of involvement measures 4cm x 5cm on each buttock, medial thighs are not affected. Exposed tissue is red to pink, moist with scant serous exudate. Wound bed:As noted above Drainage (amount, consistency, odor) As noted above. Periwound: intact, dry, buttocks are recovering from maceration Dressing procedure/placement/frequency: Patient is on a mattress replacement with loss air loss feature; we will suggest turning and repositioning be from supine to left side and that time on the right side be minimized to promote tissue reperfusion to the right hip. Prevalon pressure redistribution heel boots are provided to prevent pressure injury to the heels (which are currently intact). Preventative silicone foam dressing is placed to the intact sacrum, silicone foam will enhance tissue repair to the right elbow.  In addition to turning and repositioning, Gerhart's Butt Cream (1:1:1 zinc oxide, hydrocortisone and lotrimin cream) is to be applied to the bilateral buttocks in an effort to promote resolution to the denuded  tissue there. POC discussed with patient's daughter and bedside RN. [place] nursing team will not follow, but will remain available to this patient, the nursing and medical teams.  Please re-consult if needed. Thanks, Maudie Flakes, MSN, RN, [place], [facility]  Pager# [phone]

## 2017-03-13 NOTE — Progress Notes (Signed)
PULMONARY / CRITICAL CARE MEDICINE   Name: Kayla Tyler MRN: [id number] DOB: [birth year] PCP Tawny Asal, MD LOS 1 as of 03/13/2017     ADMISSION DATE:  02/16/2017 CONSULTATION DATE:  03/13/2017   REFERRING MD:  ER  CHIEF COMPLAINT:  Encephalopathy, vdrf, sepsis  BRIEF 71 yo F presented to the ED because of altered mental status. Patient is poor historian, so the hx was taken from the records and her daughter. Patient had decreased oral intake for the las few days, however she was alert and interactive. Today she was confused, EMS called and found the patient to be hypothermic, bradycardiac and hypotensive. Patient intubated in the ED for airway protection.    PAST MEDICAL HISTORY :  She  has a past medical history of Breast cancer ([place]); COPD (chronic obstructive pulmonary disease) ([place]); ETOH abuse; Hypertension; and Tobacco abuse.    SUBJECTIVE/OVERNIGHT/INTERVAL HX 03/13/2017 - intubateed. On levophed 73mcg, vaso 0.03 and neo at 173mcg. 60% fio2 on =vent. Dauhter at bedside - reports daily etoh and cig. Admits to copd hx. Reports that annually gets flu/pna this time of the year  VITAL SIGNS: BP 90/70 (BP Location: Right Arm)   Pulse (!) 30   Temp 99.3 F (37.4 C)   Resp (!) 28   Ht 5' (1.524 m)   Wt 39.8 kg (87 lb 11.9 oz)   SpO2 92%   BMI 17.14 kg/m   HEMODYNAMICS:    VENTILATOR SETTINGS: Vent Mode: PRVC FiO2 (%):  [60 %-100 %] 60 % Set Rate:  [15 bmp-30 bmp] 30 bmp Vt Set:  [370 mL-450 mL] 400 mL PEEP:  [5 cmH20] 5 cmH20 Plateau Pressure:  [17 cmH20-23 cmH20] 23 cmH20  INTAKE / OUTPUT: I/O last 3 completed shifts: In: 4836.3 [I.V.:1116.3; IV Piggyback:3720] Out: 235 [Urine:35; Emesis/NG output:200]     EXAM  General Appearance:    Looks criticall ill. VERY FRAIL  Head:    Normocephalic, without obvious abnormality, atraumatic  Eyes:    PERRL - yes, conjunctiva/corneas - clear      Ears:    Normal external ear canals, both ears  Nose:   NG tube  - no  Throat:  ETT TUBE - yes , OG tube - yes  Neck:   Supple,  No enlargement/tenderness/nodules     Lungs:     Clear to auscultation bilaterally, Ventilator   Synchrony - yes, RR 30s  Chest wall:    No deformity  Heart:    S1 and S2 normal, no murmur, CVP - no3.  Pressors - yes on multipled  Abdomen:     Soft, no masses, no organomegaly  Genitalia:    Not done  Rectal:   not done  Extremities:   Extremities- lean     Skin:   Intact in exposed areas . Sacral area - per RN has hip and elbow decub prior to admission     Neurologic:   Sedation - none -> RASS - -3 .        LABS  PULMONARY  Recent Labs Lab 03/13/17 0359  PHART 7.112*  PCO2ART 70.1*  PO2ART 69.0*  HCO3 22.8  TCO2 25  O2SAT 88.0    CBC  Recent Labs Lab 03/16/2017 1945 03/13/17 0239  HGB 10.1* 9.1*  HCT 27.4* 25.1*  WBC 9.0 10.3  PLT 150 157    COAGULATION No results for input(s): INR in the last 168 hours.  CARDIAC  No results for input(s): TROPONINI in the last 168 hours.  No results for input(s): PROBNP in the last 168 hours.   CHEMISTRY  Recent Labs Lab 03/03/2017 1945 03/13/17 0239  NA 106* 108*  K 4.6 3.9  CL 71* 77*  CO2 23 24  GLUCOSE 61* 126*  BUN 8 11  CREATININE 0.78 0.73  CALCIUM 7.2* 6.3*   Estimated Creatinine Clearance: 39.9 mL/min (by C-G formula based on SCr of 0.73 mg/dL).   LIVER  Recent Labs Lab 03/15/2017 1945  AST 198*  ALT 58*  ALKPHOS 244*  BILITOT 1.7*  PROT 5.1*  ALBUMIN 2.0*     INFECTIOUS  Recent Labs Lab 03/09/2017 1959 03/01/2017 2313 03/13/17 0340  LATICACIDVEN 6.32* 2.33* 2.1*     ENDOCRINE CBG (last 3)   Recent Labs  03/13/17 0216 03/13/17 0421  GLUCAP 128* 97         IMAGING x48h  - image(s) personally visualized  -   highlighted in bold Ct Abdomen Pelvis Wo Contrast  Result Date: 03/13/2017 CLINICAL DATA:  Acute onset of generalized weakness. Altered mental status. Sepsis. Initial encounter. EXAM: CT CHEST, ABDOMEN  AND PELVIS WITHOUT CONTRAST TECHNIQUE: Multidetector CT imaging of the chest, abdomen and pelvis was performed following the standard protocol without IV contrast. COMPARISON:  CT of the abdomen and pelvis performed 04/03/2005, and CTA of the chest performed 05/01/2013 FINDINGS: CT CHEST FINDINGS Cardiovascular: There is aneurysmal dilatation of the ascending thoracic aorta to 4.4 cm in AP dimension. Scattered calcification is noted along the thoracic aorta and proximal great vessels. Diffuse coronary artery calcifications are seen. The heart is normal in size. Mild calcification noted at the aortic valve. A right IJ line is noted ending at the right atrium. Mediastinum/Nodes: There is distention of the esophagus with fluid, concerning for esophageal dysmotility. The patient's enteric tube is noted ending at the body of the stomach. The patient's endotracheal tube is seen ending 2 cm above the carina. No mediastinal lymphadenopathy is seen. No pericardial effusion is identified. The thyroid gland is unremarkable. No axillary lymphadenopathy is appreciated. Lungs/Pleura: A small right pleural effusion is noted. There is dense consolidation of the right lower lobe, and patchy airspace opacities within the right upper and middle lobes, concerning for multifocal pneumonia. The left lung remains clear. Rightward mediastinal shift reflects right-sided volume loss. No pneumothorax is seen. Musculoskeletal: No acute osseous abnormalities are identified. The visualized musculature is unremarkable in appearance. The patient is status post left-sided mastectomy. CT ABDOMEN PELVIS FINDINGS Hepatobiliary: The liver is unremarkable in appearance. The patient is status post cholecystectomy, with clips noted at the gallbladder fossa. The common bile duct remains normal in caliber. Pancreas: Vague soft tissue edema is noted about the head and body of the pancreas, concerning for pancreatitis. This is not well assessed without  contrast. Spleen: The spleen is diminutive and grossly unremarkable. Adrenals/Urinary Tract: The adrenal glands are grossly unremarkable in appearance. There is no evidence of hydronephrosis. No renal or ureteral stones are identified. Small left renal cysts are noted. No perinephric stranding is appreciated. Stomach/Bowel: Mesenteric edema is noted, possibly reflecting the suspected pancreatic process. The stomach is grossly unremarkable in appearance. The appendix is not definitely characterized; there is no evidence of appendicitis. Scattered diverticulosis is noted along the sigmoid colon, without evidence of diverticulitis. The small bowel is grossly unremarkable in appearance. Vascular/Lymphatic: Diffuse calcification is seen along the abdominal aorta and its branches. The abdominal aorta is otherwise grossly unremarkable. The inferior vena cava is grossly unremarkable. No retroperitoneal lymphadenopathy is seen. No pelvic sidewall  lymphadenopathy is identified. Reproductive: There appears to be a 3.7 cm right-sided dermoid cyst, containing a small amount of fat. The bladder is decompressed, with a Foley catheter in place. The patient is likely status post hysterectomy. Other:  Soft tissue edema is noted at the right flank. Musculoskeletal: No acute osseous abnormalities are identified. There is chronic compression deformity of vertebral body L2, new from 2006. The visualized musculature is unremarkable in appearance. IMPRESSION: 1. Dense right lower lobe consolidation, and additional patchy airspace opacities within the right upper and middle lobes, compatible with multifocal pneumonia. Would correlate clinically for evidence of aspiration. 2. Small right pleural effusion noted. 3. Vague soft tissue edema about the head and body of the pancreas is concerning for pancreatitis. This is not well assessed without contrast. Underlying mesenteric edema may reflect the suspected pancreatic process. 4. Distention of  the esophagus with fluid, concerning for significant esophageal dysmotility. Enteric tube noted ending at the body of the stomach. 5. 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 Recommend annual imaging followup by CTA or MRA. This recommendation follows 2010 ACCF/AHA/AATS/ACR/ASA/SCA/SCAI/SIR/STS/SVM Guidelines for the Diagnosis and Management of Patients with Thoracic Aortic Disease. Circulation. 2010; 121: e266-e369 6. Diffuse coronary artery calcifications seen. 7. Scattered diverticulosis along the sigmoid colon, without evidence of diverticulitis. 8. Diffuse aortic atherosclerosis. 9. Apparent 3.7 cm right adnexal dermoid cyst noted, mildly increased in size from 2006. 10. 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 11. Soft tissue edema at the right flank. Electronically Signed   By: Garald Balding M.D.   On: 03/13/2017 02:18   Ct Head Wo Contrast  Result Date: 03/13/2017 CLINICAL DATA:  Altered mental status for 3 days. Nonverbal, evaluate encephalopathy. History of hypertension, breast cancer, alcohol abuse. EXAM: CT HEAD WITHOUT CONTRAST TECHNIQUE: Contiguous axial images were obtained from the base of the skull through the vertex without intravenous contrast. COMPARISON:  CT HEAD September 08, 2007 FINDINGS: Moderately motion degraded examination. BRAIN: No intraparenchymal hemorrhage, mass effect nor midline shift. Moderate parenchymal brain volume loss, no hydrocephalus. Patchy supratentorial white matter hypodensities less than expected for patient's age, though non-specific are most compatible with chronic small vessel ischemic disease. No acute large vascular territory infarcts. No abnormal extra-axial fluid collections. Basal cisterns are patent. VASCULAR: Moderate calcific atherosclerosis of the carotid siphons. SKULL: No skull fracture. No significant scalp soft tissue swelling. SINUSES/ORBITS: The mastoid air-cells and included  paranasal sinuses are well-aerated.Increased proptosis. OTHER: Life-support lines in place. IMPRESSION: 1. No acute intracranial process on this moderately motion degraded examination. 2. Moderate parenchymal brain volume loss and mild chronic small vessel ischemic disease. Electronically Signed   By: Elon Alas M.D.   On: 03/13/2017 01:40   Ct Chest Wo Contrast  Result Date: 03/13/2017 CLINICAL DATA:  Acute onset of generalized weakness. Altered mental status. Sepsis. Initial encounter. EXAM: CT CHEST, ABDOMEN AND PELVIS WITHOUT CONTRAST TECHNIQUE: Multidetector CT imaging of the chest, abdomen and pelvis was performed following the standard protocol without IV contrast. COMPARISON:  CT of the abdomen and pelvis performed 04/03/2005, and CTA of the chest performed 05/01/2013 FINDINGS: CT CHEST FINDINGS Cardiovascular: There is aneurysmal dilatation of the ascending thoracic aorta to 4.4 cm in AP dimension. Scattered calcification is noted along the thoracic aorta and proximal great vessels. Diffuse coronary artery calcifications are seen. The heart is normal in size. Mild calcification noted at the aortic valve. A right IJ line is noted ending at the right atrium. Mediastinum/Nodes:  There is distention of the esophagus with fluid, concerning for esophageal dysmotility. The patient's enteric tube is noted ending at the body of the stomach. The patient's endotracheal tube is seen ending 2 cm above the carina. No mediastinal lymphadenopathy is seen. No pericardial effusion is identified. The thyroid gland is unremarkable. No axillary lymphadenopathy is appreciated. Lungs/Pleura: A small right pleural effusion is noted. There is dense consolidation of the right lower lobe, and patchy airspace opacities within the right upper and middle lobes, concerning for multifocal pneumonia. The left lung remains clear. Rightward mediastinal shift reflects right-sided volume loss. No pneumothorax is seen.  Musculoskeletal: No acute osseous abnormalities are identified. The visualized musculature is unremarkable in appearance. The patient is status post left-sided mastectomy. CT ABDOMEN PELVIS FINDINGS Hepatobiliary: The liver is unremarkable in appearance. The patient is status post cholecystectomy, with clips noted at the gallbladder fossa. The common bile duct remains normal in caliber. Pancreas: Vague soft tissue edema is noted about the head and body of the pancreas, concerning for pancreatitis. This is not well assessed without contrast. Spleen: The spleen is diminutive and grossly unremarkable. Adrenals/Urinary Tract: The adrenal glands are grossly unremarkable in appearance. There is no evidence of hydronephrosis. No renal or ureteral stones are identified. Small left renal cysts are noted. No perinephric stranding is appreciated. Stomach/Bowel: Mesenteric edema is noted, possibly reflecting the suspected pancreatic process. The stomach is grossly unremarkable in appearance. The appendix is not definitely characterized; there is no evidence of appendicitis. Scattered diverticulosis is noted along the sigmoid colon, without evidence of diverticulitis. The small bowel is grossly unremarkable in appearance. Vascular/Lymphatic: Diffuse calcification is seen along the abdominal aorta and its branches. The abdominal aorta is otherwise grossly unremarkable. The inferior vena cava is grossly unremarkable. No retroperitoneal lymphadenopathy is seen. No pelvic sidewall lymphadenopathy is identified. Reproductive: There appears to be a 3.7 cm right-sided dermoid cyst, containing a small amount of fat. The bladder is decompressed, with a Foley catheter in place. The patient is likely status post hysterectomy. Other:  Soft tissue edema is noted at the right flank. Musculoskeletal: No acute osseous abnormalities are identified. There is chronic compression deformity of vertebral body L2, new from 2006. The visualized  musculature is unremarkable in appearance. IMPRESSION: 1. Dense right lower lobe consolidation, and additional patchy airspace opacities within the right upper and middle lobes, compatible with multifocal pneumonia. Would correlate clinically for evidence of aspiration. 2. Small right pleural effusion noted. 3. Vague soft tissue edema about the head and body of the pancreas is concerning for pancreatitis. This is not well assessed without contrast. Underlying mesenteric edema may reflect the suspected pancreatic process. 4. Distention of the esophagus with fluid, concerning for significant esophageal dysmotility. Enteric tube noted ending at the body of the stomach. 5. 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 Recommend annual imaging followup by CTA or MRA. This recommendation follows 2010 ACCF/AHA/AATS/ACR/ASA/SCA/SCAI/SIR/STS/SVM Guidelines for the Diagnosis and Management of Patients with Thoracic Aortic Disease. Circulation. 2010; 121: [id number] 6. Diffuse coronary artery calcifications seen. 7. Scattered diverticulosis along the sigmoid colon, without evidence of diverticulitis. 8. Diffuse aortic atherosclerosis. 9. Apparent 3.7 cm right adnexal dermoid cyst noted, mildly increased in size from 2006. 10. 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 11. Soft tissue edema at the right flank. Electronically Signed   By: Garald Balding M.D.   On: 03/13/2017 02:18   Dg Chest Portable 1 View  Result Date: 03/13/2017 CLINICAL  DATA:  71 year old female status post intubation and enteric tube placement. EXAM: PORTABLE CHEST 1 VIEW COMPARISON:  Chest radiograph dated 03/04/2017 and CT dated 05/01/2013 FINDINGS: The patient is rotated to the right which limits evaluation for positioning of the support devices. An endotracheal tube appears to be approximately 17 mm above the carina. Recommend retraction of the tube by approximately 3 cm for optimal  positioning and repeat radiograph with better position of the patient if possible. An enteric tube extends into the left hemiabdomen with tip beyond the inferior margin of the image and side-port beyond the GE junction. Right IJ central line noted in stable position. The Of the left lung is clear. Evaluation of the right lung is limited due to superimposition of the mediastinal structures secondary to patient rotation. There is however no pleural effusion or pneumothorax. There is atherosclerotic calcification of the thoracic aorta. No acute osseous pathology identified. IMPRESSION: Limited evaluation due to patient positioning and rotation. The endotracheal tube appears above the carina recommend retraction by approximately 3 cm for optimal positioning. Enteric tube is located in the left hemiabdomen. Electronically Signed   By: Anner Crete M.D.   On: 03/13/2017 00:41   Dg Chest Portable 1 View  Result Date: 02/27/2017 CLINICAL DATA:  Jugular catheter placement. EXAM: PORTABLE CHEST 1 VIEW COMPARISON:  Earlier today. FINDINGS: The patient is markedly rotated to the right. Interval right jugular catheter with its tip in the right atrium. Grossly normal sized heart and clear lungs. Diffuse osteopenia. No pneumothorax seen. IMPRESSION: Right jugular catheter tip in the right atrium without pneumothorax. Electronically Signed   By: Claudie Revering M.D.   On: 03/11/2017 22:45   Dg Chest Port 1 View  Result Date: 03/09/2017 CLINICAL DATA:  Hypertension, possible sepsis. EXAM: PORTABLE CHEST 1 VIEW COMPARISON:  11/18/2016 FINDINGS: Lungs are adequately inflated without consolidation or effusion. Patient is slightly rotated to the right. Cardiomediastinal silhouette is within normal. There is calcified plaque over the thoracic aorta. Remainder of the exam is unchanged. IMPRESSION: No acute cardiopulmonary disease. Electronically Signed   By: Marin Olp M.D.   On: 03/17/2017 20:14       ASSESSMENT and  PLAN   ASSESSMENT / PLAN:  PULMONARY A: #baseline: COPD NOS - suppposed to be on o2 but not per daugther #current: RLL PNA and Acute resp failure -? CAP v Aspiration  03/13/2017 -> does not meet sbt critteria  P:   Full vent support Reduce RR due to copd  CARDIOVASCULAR A:  #baseline: normal EF 2014 #current refractory circulatory shock at admit ? sepsis  03/13/2017 -> ongoing multiple pressors  P:  Check PCT MAP goal > 65 ? Candidate for giaprezza  RENAL  Intake/Output Summary (Last 24 hours) at 03/13/17 1132 Last data filed at 03/13/17 1100  Gross per 24 hour  Intake          5634.73 ml  Output              260 ml  Net          5374.73 ml    Recent Labs Lab 03/09/2017 1945 03/13/17 0239  CREATININE 0.78 0.73     A:   #baseline: creat 0.4mg % #current 0.7mg % in lean individual c/w AKI. Also acute on chronic hyponatremia with shock (baseline Na 128)  03/13/2017 -> oliguric, CVP 5  P:   Fluid bolus an dthjen 125cc/h Maitnain BP/HR Monitor Na  GASTROINTESTINAL A:   CT 02/24/2017 wiut sugestion of pancreatitis P:  Check amylase, lipase NPO for now PPI  HEMATOLOGIC  Recent Labs Lab 02/22/2017 1945 03/13/17 0239  HGB 10.1* 9.1*  HCT 27.4* 25.1*  WBC 9.0 10.3  PLT 150 157    A:   #RBC: anemia critical illness #Platelet normal #WBC normal  P:  - PRBC for hgb </= 6.9gm%    - exceptions are   -  if ACS susepcted/confirmed then transfuse for hgb </= 8.0gm%,  or    -  active bleeding with hemodynamic instability, then transfuse regardless of hemoglobin value   At at all times try to transfuse 1 unit prbc as possible with exception of active hemorrhage    INFECTIOUS No results for input(s): PROCALCITON in the last 168 hours.  Results for orders placed or performed during the hospital encounter of 02/28/2017  Blood Culture (routine x 2)     Status: None (Preliminary result)   Collection Time: 03/06/2017  7:45 PM  Result Value Ref Range  Status   Specimen Description BLOOD LEFT ANTECUBITAL  Final   Special Requests IN PEDIATRIC BOTTLE Blood Culture adequate volume  Final   Culture NO GROWTH < 12 HOURS  Final   Report Status PENDING  Incomplete  Blood Culture (routine x 2)     Status: None (Preliminary result)   Collection Time: 02/21/2017  7:50 PM  Result Value Ref Range Status   Specimen Description BLOOD RIGHT ARM  Final   Special Requests   Final    BOTTLES DRAWN AEROBIC ONLY Blood Culture adequate volume   Culture NO GROWTH < 12 HOURS  Final   Report Status PENDING  Incomplete  MRSA PCR Screening     Status: None   Collection Time: 03/13/17  1:59 AM  Result Value Ref Range Status   MRSA by PCR NEGATIVE NEGATIVE Final    Comment:        The GeneXpert MRSA Assay (FDA approved for NASAL specimens only), is one component of a comprehensive MRSA colonization surveillance program. It is not intended to diagnose MRSA infection nor to guide or monitor treatment for MRSA infections.     A:   RLL PNA - cap v aspiration P:   Check RVP Check urine strep, legioneall, HIV Check sepsi biomarkers Anti-infectives    Start     Dose/Rate Route Frequency Ordered Stop   03/13/17 2100  vancomycin (VANCOCIN) IVPB 750 mg/150 ml premix     750 mg 150 mL/hr over 60 Minutes Intravenous Every 24 hours 03/06/2017 2039     03/13/17 2000  levofloxacin (LEVAQUIN) IVPB 500 mg     500 mg 100 mL/hr over 60 Minutes Intravenous Every 24 hours 02/27/2017 2039     03/13/17 0400  aztreonam (AZACTAM) 1 g in dextrose 5 % 50 mL IVPB     1 g 100 mL/hr over 30 Minutes Intravenous Every 8 hours 02/27/2017 2039     02/16/2017 2030  vancomycin (VANCOCIN) IVPB 750 mg/150 ml premix     750 mg 150 mL/hr over 60 Minutes Intravenous  Once 02/28/2017 2020 02/17/2017 2200   02/24/2017 2000  levofloxacin (LEVAQUIN) IVPB 750 mg     750 mg 100 mL/hr over 90 Minutes Intravenous  Once 03/09/2017 1946 03/16/2017 2151   03/13/2017 2000  aztreonam (AZACTAM) 2 g in dextrose 5 %  50 mL IVPB     2 g 100 mL/hr over 30 Minutes Intravenous  Once 03/13/2017 1946 03/13/2017 2051   03/09/2017 2000  vancomycin (VANCOCIN) IVPB 1000 mg/200 mL premix  Status:  Discontinued     1,000 mg 200 mL/hr over 60 Minutes Intravenous  Once 02/19/2017 1946 03/07/2017 2020       ENDOCRINE A:   At risk hyoperglycemia  P:   ICU hyperglycemia protocol  NEUROLOGIC A:   #Baseline : nil acute  #Current: obtunded   03/13/2017 -> on sedation prn and obtunded ? Na effect P:   RASS goal: 0 Monitor Recheck Na   FAMILY  - Updates: 03/13/2017 --> daughter updated  - Inter-disciplinary family meet or Palliative Care meeting due by:  DAy 7. Current LOS is LOS 1 days   DISPO Keep in ICU    The patient is critically ill with multiple organ systems failure and requires high complexity decision making for assessment and support, frequent evaluation and titration of therapies, application of advanced monitoring technologies and extensive interpretation of multiple databases.   Critical Care Time devoted to patient care services described in this note is  30  Minutes. This time reflects time of care of this signee Dr Brand Males. This critical care time does not reflect procedure time, or teaching time or supervisory time of PA/NP/Med student/Med Resident etc but could involve care discussion time    Dr. Brand Males, M.D., [facility].C.P Pulmonary and Critical Care Medicine Staff Physician [facility] Pager: [phone], If no answer or between  15:00h - 7:00h: call [phone]  03/13/2017 11:32 AM

## 2017-03-13 NOTE — Progress Notes (Addendum)
CRITICAL VALUE ALERT  Critical Value:  Na 108, Ca 6.3   Date & Time Notied:  03/13/17 0356  Provider Notified: Dr Deterding  Orders Received/Actions taken: Ordered to start 3% hypertonic solution and 2g calcium gluconate

## 2017-03-13 NOTE — ED Notes (Signed)
Sherin Nulty (Daughter) [phone] call for updates

## 2017-03-13 NOTE — Procedures (Signed)
Arterial Catheter Insertion Procedure Note Kayla Tyler [id number] [birth year]  Procedure: Insertion of Arterial Catheter  Indications: Blood pressure monitoring  Procedure Details Consent: Unable to obtain consent because of emergent medical necessity. Time Out: Verified patient identification, verified procedure, site/side was marked, verified correct patient position, special equipment/implants available, medications/allergies/relevent history reviewed, required imaging and test results available.  Performed  Maximum sterile technique was used including antiseptics, cap, gloves, gown, hand hygiene, mask and sheet. Skin prep: Chlorhexidine; local anesthetic administered 20 gauge catheter was inserted into right radial artery using the Seldinger technique.  Evaluation Blood flow good; BP tracing good. Complications: No apparent complications.   Freddi Starr 03/13/2017

## 2017-03-13 NOTE — Progress Notes (Signed)
PHARMACY - PHYSICIAN COMMUNICATION CRITICAL VALUE ALERT - BLOOD CULTURE IDENTIFICATION (BCID)  Results for orders placed or performed during the hospital encounter of 02/18/2017  Blood Culture ID Panel (Reflexed) (Collected: 02/22/2017  7:50 PM)  Result Value Ref Range   Enterococcus species NOT DETECTED NOT DETECTED   Listeria monocytogenes NOT DETECTED NOT DETECTED   Staphylococcus species DETECTED (A) NOT DETECTED   Staphylococcus aureus NOT DETECTED NOT DETECTED   Methicillin resistance NOT DETECTED NOT DETECTED   Streptococcus species NOT DETECTED NOT DETECTED   Streptococcus agalactiae NOT DETECTED NOT DETECTED   Streptococcus pneumoniae NOT DETECTED NOT DETECTED   Streptococcus pyogenes NOT DETECTED NOT DETECTED   Acinetobacter baumannii NOT DETECTED NOT DETECTED   Enterobacteriaceae species NOT DETECTED NOT DETECTED   Enterobacter cloacae complex NOT DETECTED NOT DETECTED   Escherichia coli NOT DETECTED NOT DETECTED   Klebsiella oxytoca NOT DETECTED NOT DETECTED   Klebsiella pneumoniae NOT DETECTED NOT DETECTED   Proteus species NOT DETECTED NOT DETECTED   Serratia marcescens NOT DETECTED NOT DETECTED   Haemophilus influenzae NOT DETECTED NOT DETECTED   Neisseria meningitidis NOT DETECTED NOT DETECTED   Pseudomonas aeruginosa NOT DETECTED NOT DETECTED   Candida albicans NOT DETECTED NOT DETECTED   Candida glabrata NOT DETECTED NOT DETECTED   Candida krusei NOT DETECTED NOT DETECTED   Candida parapsilosis NOT DETECTED NOT DETECTED   Candida tropicalis NOT DETECTED NOT DETECTED    Name of physician (or Provider) Contacted: Dr. Oletta Darter  Changes to prescribed antibiotics required: None required. Patient is on broad spectrum antibiotics.   Albertina Parr, PharmD., BCPS Clinical Pharmacist Pager [phone]

## 2017-03-13 NOTE — Progress Notes (Signed)
CRITICAL VALUE ALERT  Critical Value:  Lactic 5.0, Na+ 117  Date & Time Notied:  03/13/17 @0130   Provider Notified: Dr. Chase Caller

## 2017-03-13 NOTE — Progress Notes (Signed)
[facility] Cancellation Note  Patient Details Name: Kayla Tyler MRN: [id number] DOB: February 14, 1946   Cancelled treatment:       Reason Eval/Treat Not Completed: Medical issues which prohibited therapy (Patient intubated. Will f/u 10/28. )  Gabriel Rainwater [place], [place] [phone]  Antione Obar Meryl 03/13/2017, 7:19 AM

## 2017-03-14 ENCOUNTER — Inpatient Hospital Stay (HOSPITAL_COMMUNITY): Payer: Medicare Other

## 2017-03-14 DIAGNOSIS — R6521 Severe sepsis with septic shock: Secondary | ICD-10-CM

## 2017-03-14 DIAGNOSIS — A419 Sepsis, unspecified organism: Principal | ICD-10-CM

## 2017-03-14 DIAGNOSIS — J13 Pneumonia due to Streptococcus pneumoniae: Secondary | ICD-10-CM

## 2017-03-14 LAB — BASIC METABOLIC PANEL
ANION GAP: 9 (ref 5–15)
BUN: 15 mg/dL (ref 6–20)
CO2: 20 mmol/L — ABNORMAL LOW (ref 22–32)
Calcium: 6.5 mg/dL — ABNORMAL LOW (ref 8.9–10.3)
Chloride: 88 mmol/L — ABNORMAL LOW (ref 101–111)
Creatinine, Ser: 1.1 mg/dL — ABNORMAL HIGH (ref 0.44–1.00)
GFR, EST AFRICAN AMERICAN: 57 mL/min — AB (ref >60)
GFR, EST NON AFRICAN AMERICAN: 49 mL/min — AB (ref >60)
Glucose, Bld: 170 mg/dL — ABNORMAL HIGH (ref 65–99)
POTASSIUM: 3.4 mmol/L — AB (ref 3.5–5.1)
SODIUM: 117 mmol/L — AB (ref 135–145)

## 2017-03-14 LAB — POCT I-STAT 3, ART BLOOD GAS (G3+)
Acid-base deficit: 6 mmol/L — ABNORMAL HIGH (ref 0.0–2.0)
Bicarbonate: 20.2 mmol/L (ref 20.0–28.0)
O2 SAT: 93 %
PCO2 ART: 42.8 mmHg (ref 32.0–48.0)
PO2 ART: 76 mmHg — AB (ref 83.0–108.0)
TCO2: 22 mmol/L (ref 22–32)
pH, Arterial: 7.283 — ABNORMAL LOW (ref 7.350–7.450)

## 2017-03-14 LAB — GLUCOSE, CAPILLARY
GLUCOSE-CAPILLARY: 175 mg/dL — AB (ref 65–99)
GLUCOSE-CAPILLARY: 146 mg/dL — AB (ref 65–99)
Glucose-Capillary: 149 mg/dL — ABNORMAL HIGH (ref 65–99)
Glucose-Capillary: 179 mg/dL — ABNORMAL HIGH (ref 65–99)
Glucose-Capillary: 198 mg/dL — ABNORMAL HIGH (ref 65–99)

## 2017-03-14 LAB — RESPIRATORY PANEL BY PCR
ADENOVIRUS-RVPPCR: NOT DETECTED
Bordetella pertussis: NOT DETECTED
CHLAMYDOPHILA PNEUMONIAE-RVPPCR: NOT DETECTED
CORONAVIRUS 229E-RVPPCR: NOT DETECTED
CORONAVIRUS HKU1-RVPPCR: NOT DETECTED
Coronavirus NL63: NOT DETECTED
Coronavirus OC43: NOT DETECTED
Influenza A: NOT DETECTED
Influenza B: NOT DETECTED
METAPNEUMOVIRUS-RVPPCR: NOT DETECTED
Mycoplasma pneumoniae: NOT DETECTED
PARAINFLUENZA VIRUS 1-RVPPCR: NOT DETECTED
Parainfluenza Virus 2: NOT DETECTED
Parainfluenza Virus 3: NOT DETECTED
Parainfluenza Virus 4: NOT DETECTED
RESPIRATORY SYNCYTIAL VIRUS-RVPPCR: NOT DETECTED
Rhinovirus / Enterovirus: NOT DETECTED

## 2017-03-14 LAB — URINE CULTURE: CULTURE: NO GROWTH

## 2017-03-14 LAB — CBC WITH DIFFERENTIAL/PLATELET
BASOS ABS: 0 10*3/uL (ref 0.0–0.1)
Basophils Relative: 0 %
EOS ABS: 0 10*3/uL (ref 0.0–0.7)
Eosinophils Relative: 0 %
HCT: 22.4 % — ABNORMAL LOW (ref 36.0–46.0)
HEMOGLOBIN: 8.3 g/dL — AB (ref 12.0–15.0)
Lymphocytes Relative: 3 %
Lymphs Abs: 0.4 10*3/uL — ABNORMAL LOW (ref 0.7–4.0)
MCH: 34.7 pg — ABNORMAL HIGH (ref 26.0–34.0)
MCHC: 36.9 g/dL — AB (ref 30.0–36.0)
MCV: 94.5 fL (ref 78.0–100.0)
MONO ABS: 0.7 10*3/uL (ref 0.1–1.0)
Monocytes Relative: 5 %
NEUTROS PCT: 92 %
Neutro Abs: 13.5 10*3/uL — ABNORMAL HIGH (ref 1.7–7.7)
Platelets: 117 10*3/uL — ABNORMAL LOW (ref 150–400)
RBC: 2.37 MIL/uL — ABNORMAL LOW (ref 3.87–5.11)
RDW: 12.2 % (ref 11.5–15.5)
WBC Morphology: INCREASED
WBC: 14.6 10*3/uL — ABNORMAL HIGH (ref 4.0–10.5)

## 2017-03-14 LAB — CULTURE, BLOOD (ROUTINE X 2): Special Requests: ADEQUATE

## 2017-03-14 LAB — LACTIC ACID, PLASMA: LACTIC ACID, VENOUS: 2.6 mmol/L — AB (ref 0.5–1.9)

## 2017-03-14 LAB — HEPATIC FUNCTION PANEL
ALT: 88 U/L — ABNORMAL HIGH (ref 14–54)
AST: 323 U/L — ABNORMAL HIGH (ref 15–41)
Albumin: 1.4 g/dL — ABNORMAL LOW (ref 3.5–5.0)
Alkaline Phosphatase: 131 U/L — ABNORMAL HIGH (ref 38–126)
BILIRUBIN INDIRECT: 0.5 mg/dL (ref 0.3–0.9)
Bilirubin, Direct: 0.5 mg/dL (ref 0.1–0.5)
Total Bilirubin: 1 mg/dL (ref 0.3–1.2)
Total Protein: 4 g/dL — ABNORMAL LOW (ref 6.5–8.1)

## 2017-03-14 LAB — TROPONIN I: TROPONIN I: 0.06 ng/mL — AB (ref <0.03)

## 2017-03-14 LAB — PROTIME-INR
INR: 1.8
PROTHROMBIN TIME: 20.7 s — AB (ref 11.4–15.2)

## 2017-03-14 LAB — PROCALCITONIN: PROCALCITONIN: 6.13 ng/mL

## 2017-03-14 LAB — MAGNESIUM: MAGNESIUM: 1.1 mg/dL — AB (ref 1.7–2.4)

## 2017-03-14 LAB — STREP PNEUMONIAE URINARY ANTIGEN: Strep Pneumo Urinary Antigen: POSITIVE — AB

## 2017-03-14 LAB — PHOSPHORUS: PHOSPHORUS: 1.3 mg/dL — AB (ref 2.5–4.6)

## 2017-03-14 LAB — HIV ANTIBODY (ROUTINE TESTING W REFLEX): HIV Screen 4th Generation wRfx: NONREACTIVE

## 2017-03-14 MED ORDER — POTASSIUM CHLORIDE 20 MEQ/15ML (10%) PO SOLN
40.0000 meq | Freq: Once | ORAL | Status: AC
  Administered 2017-03-14: 40 meq
  Filled 2017-03-14: qty 30

## 2017-03-14 MED ORDER — MAGNESIUM SULFATE 4 GM/100ML IV SOLN
4.0000 g | Freq: Once | INTRAVENOUS | Status: AC
  Administered 2017-03-14: 4 g via INTRAVENOUS
  Filled 2017-03-14: qty 100

## 2017-03-14 MED ORDER — SODIUM PHOSPHATES 45 MMOLE/15ML IV SOLN
20.0000 mmol | Freq: Once | INTRAVENOUS | Status: AC
  Administered 2017-03-14: 20 mmol via INTRAVENOUS
  Filled 2017-03-14: qty 6.67

## 2017-03-14 NOTE — Progress Notes (Signed)
CRITICAL VALUE ALERT  Critical Value:  Troponin 0.06  Date & Time Notied:  03/14/2017 @0800    Provider Notified: Dr. Chase Caller

## 2017-03-14 NOTE — Progress Notes (Signed)
PULMONARY / CRITICAL CARE MEDICINE   Name: Kayla Tyler MRN: [id number] DOB: 1945/08/18 PCP Tawny Asal, MD LOS 2 as of 03/14/2017     ADMISSION DATE:  03/11/2017 CONSULTATION DATE:  03/14/2017   REFERRING MD:  ER  CHIEF COMPLAINT:  Encephalopathy, vdrf, sepsis  BRIEF 71 yo F presented to the ED because of altered mental status. Patient is poor historian, so the hx was taken from the records and her daughter. Patient had decreased oral intake for the las few days, however she was alert and interactive. Today she was confused, EMS called and found the patient to be hypothermic, bradycardiac and hypotensive. Patient intubated in the ED for airway protection.    PAST MEDICAL HISTORY :  She  has a past medical history of Breast cancer ([place]); COPD (chronic obstructive pulmonary disease) ([place]); ETOH abuse; Hypertension; and Tobacco abuse.  EVENT 03/13/2017 - intubateed. On levophed 1mcg, vaso 0.03 and neo at 111mcg. 60% fio2 on =vent. Dauhter at bedside - reports daily etoh and cig. Admits to copd hx. Reports that annually gets flu/pna this time of the year   SUBJECTIVE/OVERNIGHT/INTERVAL HX 03/14/17 - BCID staph sepcied detected - coag neg, MRSA PCR negative. URINE STREP ++ , RVP NEGATIVe.  Lowering pressor needs. Off neo. AKI worse but RN says making urine  VITAL SIGNS: BP (!) 143/52   Pulse (!) 102   Temp 99 F (37.2 C)   Resp 14   Ht 5' (1.524 m)   Wt 43.5 kg (95 lb 14.4 oz)   SpO2 100%   BMI 18.73 kg/m   HEMODYNAMICS:    VENTILATOR SETTINGS: Vent Mode: CPAP;PSV FiO2 (%):  [30 %-60 %] 40 % Set Rate:  [16 bmp] 16 bmp Vt Set:  [400 mL] 400 mL PEEP:  [5 cmH20] 5 cmH20 Pressure Support:  [10 cmH20] 10 cmH20 Plateau Pressure:  [21 cmH20-25 cmH20] 23 cmH20  INTAKE / OUTPUT: I/O last 3 completed shifts: In: 9413.2 [I.V.:4919.5; NG/GT:723.8; IV Piggyback:3770] Out: 605 [Urine:255; Emesis/NG output:350]     EXAM  General Appearance:    Looks criticall ill  And  very frail  Head:    Normocephalic, without obvious abnormality, atraumatic  Eyes:    PERRL - yes, conjunctiva/corneas - clear      Ears:    Normal external ear canals, both ears  Nose:   NG tube - no  Throat:  ETT TUBE - yes , OG tube - yes on TF  Neck:   Supple,  No enlargement/tenderness/nodules     Lungs:     Clear to auscultation bilaterally, Ventilator   Synchrony - intermittent  Chest wall:    No deformity  Heart:    S1 and S2 normal, no murmur, CVP - no.  Pressors - yes but improvede  Abdomen:     Soft, no masses, no organomegaly  Genitalia:    Not done  Rectal:   not done  Extremities:   Extremities- frail and in boots     Skin:   Intact in exposed areas .     Neurologic:   Sedation - prn sedation -> RASS - +2 to -2 . Moves all 4s - yes       LABS  PULMONARY  Recent Labs Lab 03/13/17 0359 03/13/17 0624 03/13/17 1121 03/13/17 1353  PHART 7.112* 7.318* 7.387 7.358  PCO2ART 70.1* 49.6* 30.4* 36.4  PO2ART 69.0* 57.0* 69.0* 67.0*  HCO3 22.8 25.4 18.3* 20.4  TCO2 25 27 19* 21*  O2SAT 88.0 86.0  94.0 92.0    CBC  Recent Labs Lab 02/23/2017 1945 03/13/17 0239 03/14/17 0545  HGB 10.1* 9.1* 8.3*  HCT 27.4* 25.1* 22.4*  WBC 9.0 10.3 14.6*  PLT 150 157 117*    COAGULATION  Recent Labs Lab 03/14/17 0545  INR 1.80    CARDIAC    Recent Labs Lab 03/14/17 0545  TROPONINI 0.06*   No results for input(s): PROBNP in the last 168 hours.   CHEMISTRY  Recent Labs Lab 02/26/2017 1945 03/13/17 0239 03/13/17 1139 03/13/17 1827 03/14/17 0020 03/14/17 0545  NA 106* 108* 117* 118* 117*  --   K 4.6 3.9 3.4* 3.4* 3.4*  --   CL 71* 77* 88* 88* 88*  --   CO2 23 24 20* 22 20*  --   GLUCOSE 61* 126* 98 107* 170*  --   BUN 8 11 15 14 15   --   CREATININE 0.78 0.73 0.98 1.11* 1.10*  --   CALCIUM 7.2* 6.3* 6.5* 6.6* 6.5*  --   MG  --   --   --   --   --  1.1*  PHOS  --   --   --   --   --  1.3*   Estimated Creatinine Clearance: 31.7 mL/min (A) (by C-G  formula based on SCr of 1.1 mg/dL (H)).  I/O last 3 completed shifts: In: 9413.2 [I.V.:4919.5; NG/GT:723.8; IV Piggyback:3770] Out: 605 [Urine:255; Emesis/NG output:350]   Intake/Output Summary (Last 24 hours) at 03/14/17 1150 Last data filed at 03/14/17 1100  Gross per 24 hour  Intake          5058.26 ml  Output              485 ml  Net          4573.26 ml     LIVER  Recent Labs Lab 03/09/2017 1945 03/14/17 0545  AST 198* 323*  ALT 58* 88*  ALKPHOS 244* 131*  BILITOT 1.7* 1.0  PROT 5.1* 4.0*  ALBUMIN 2.0* 1.4*  INR  --  1.80     INFECTIOUS  Recent Labs Lab 03/13/17 1139 03/13/17 1630 03/14/17 0020 03/14/17 0545  LATICACIDVEN 5.0* 3.3* 2.6*  --   PROCALCITON 2.90  --   --  6.13     ENDOCRINE CBG (last 3)   Recent Labs  03/13/17 0216 03/13/17 0421 03/14/17 0904  GLUCAP 128* 97 198*         IMAGING x48h  - image(s) personally visualized  -   highlighted in bold Ct Abdomen Pelvis Wo Contrast  Result Date: 03/13/2017 CLINICAL DATA:  Acute onset of generalized weakness. Altered mental status. Sepsis. Initial encounter. EXAM: CT CHEST, ABDOMEN AND PELVIS WITHOUT CONTRAST TECHNIQUE: Multidetector CT imaging of the chest, abdomen and pelvis was performed following the standard protocol without IV contrast. COMPARISON:  CT of the abdomen and pelvis performed 04/03/2005, and CTA of the chest performed 05/01/2013 FINDINGS: CT CHEST FINDINGS Cardiovascular: There is aneurysmal dilatation of the ascending thoracic aorta to 4.4 cm in AP dimension. Scattered calcification is noted along the thoracic aorta and proximal great vessels. Diffuse coronary artery calcifications are seen. The heart is normal in size. Mild calcification noted at the aortic valve. A right IJ line is noted ending at the right atrium. Mediastinum/Nodes: There is distention of the esophagus with fluid, concerning for esophageal dysmotility. The patient's enteric tube is noted ending at the body of  the stomach. The patient's endotracheal tube is seen ending 2 cm  above the carina. No mediastinal lymphadenopathy is seen. No pericardial effusion is identified. The thyroid gland is unremarkable. No axillary lymphadenopathy is appreciated. Lungs/Pleura: A small right pleural effusion is noted. There is dense consolidation of the right lower lobe, and patchy airspace opacities within the right upper and middle lobes, concerning for multifocal pneumonia. The left lung remains clear. Rightward mediastinal shift reflects right-sided volume loss. No pneumothorax is seen. Musculoskeletal: No acute osseous abnormalities are identified. The visualized musculature is unremarkable in appearance. The patient is status post left-sided mastectomy. CT ABDOMEN PELVIS FINDINGS Hepatobiliary: The liver is unremarkable in appearance. The patient is status post cholecystectomy, with clips noted at the gallbladder fossa. The common bile duct remains normal in caliber. Pancreas: Vague soft tissue edema is noted about the head and body of the pancreas, concerning for pancreatitis. This is not well assessed without contrast. Spleen: The spleen is diminutive and grossly unremarkable. Adrenals/Urinary Tract: The adrenal glands are grossly unremarkable in appearance. There is no evidence of hydronephrosis. No renal or ureteral stones are identified. Small left renal cysts are noted. No perinephric stranding is appreciated. Stomach/Bowel: Mesenteric edema is noted, possibly reflecting the suspected pancreatic process. The stomach is grossly unremarkable in appearance. The appendix is not definitely characterized; there is no evidence of appendicitis. Scattered diverticulosis is noted along the sigmoid colon, without evidence of diverticulitis. The small bowel is grossly unremarkable in appearance. Vascular/Lymphatic: Diffuse calcification is seen along the abdominal aorta and its branches. The abdominal aorta is otherwise grossly  unremarkable. The inferior vena cava is grossly unremarkable. No retroperitoneal lymphadenopathy is seen. No pelvic sidewall lymphadenopathy is identified. Reproductive: There appears to be a 3.7 cm right-sided dermoid cyst, containing a small amount of fat. The bladder is decompressed, with a Foley catheter in place. The patient is likely status post hysterectomy. Other:  Soft tissue edema is noted at the right flank. Musculoskeletal: No acute osseous abnormalities are identified. There is chronic compression deformity of vertebral body L2, new from 2006. The visualized musculature is unremarkable in appearance. IMPRESSION: 1. Dense right lower lobe consolidation, and additional patchy airspace opacities within the right upper and middle lobes, compatible with multifocal pneumonia. Would correlate clinically for evidence of aspiration. 2. Small right pleural effusion noted. 3. Vague soft tissue edema about the head and body of the pancreas is concerning for pancreatitis. This is not well assessed without contrast. Underlying mesenteric edema may reflect the suspected pancreatic process. 4. Distention of the esophagus with fluid, concerning for significant esophageal dysmotility. Enteric tube noted ending at the body of the stomach. 5. 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 Recommend annual imaging followup by CTA or MRA. This recommendation follows 2010 ACCF/AHA/AATS/ACR/ASA/SCA/SCAI/SIR/STS/SVM Guidelines for the Diagnosis and Management of Patients with Thoracic Aortic Disease. Circulation. 2010; 121: e266-e369 6. Diffuse coronary artery calcifications seen. 7. Scattered diverticulosis along the sigmoid colon, without evidence of diverticulitis. 8. Diffuse aortic atherosclerosis. 9. Apparent 3.7 cm right adnexal dermoid cyst noted, mildly increased in size from 2006. 10. 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 11. Soft tissue edema at the right  flank. Electronically Signed   By: Garald Balding M.D.   On: 03/13/2017 02:18   Ct Head Wo Contrast  Result Date: 03/13/2017 CLINICAL DATA:  Altered mental status for 3 days. Nonverbal, evaluate encephalopathy. History of hypertension, breast cancer, alcohol abuse. EXAM: CT HEAD WITHOUT CONTRAST TECHNIQUE: Contiguous axial images were obtained from the base of the skull  through the vertex without intravenous contrast. COMPARISON:  CT HEAD September 08, 2007 FINDINGS: Moderately motion degraded examination. BRAIN: No intraparenchymal hemorrhage, mass effect nor midline shift. Moderate parenchymal brain volume loss, no hydrocephalus. Patchy supratentorial white matter hypodensities less than expected for patient's age, though non-specific are most compatible with chronic small vessel ischemic disease. No acute large vascular territory infarcts. No abnormal extra-axial fluid collections. Basal cisterns are patent. VASCULAR: Moderate calcific atherosclerosis of the carotid siphons. SKULL: No skull fracture. No significant scalp soft tissue swelling. SINUSES/ORBITS: The mastoid air-cells and included paranasal sinuses are well-aerated.Increased proptosis. OTHER: Life-support lines in place. IMPRESSION: 1. No acute intracranial process on this moderately motion degraded examination. 2. Moderate parenchymal brain volume loss and mild chronic small vessel ischemic disease. Electronically Signed   By: Elon Alas M.D.   On: 03/13/2017 01:40   Ct Chest Wo Contrast  Result Date: 03/13/2017 CLINICAL DATA:  Acute onset of generalized weakness. Altered mental status. Sepsis. Initial encounter. EXAM: CT CHEST, ABDOMEN AND PELVIS WITHOUT CONTRAST TECHNIQUE: Multidetector CT imaging of the chest, abdomen and pelvis was performed following the standard protocol without IV contrast. COMPARISON:  CT of the abdomen and pelvis performed 04/03/2005, and CTA of the chest performed 05/01/2013 FINDINGS: CT CHEST FINDINGS  Cardiovascular: There is aneurysmal dilatation of the ascending thoracic aorta to 4.4 cm in AP dimension. Scattered calcification is noted along the thoracic aorta and proximal great vessels. Diffuse coronary artery calcifications are seen. The heart is normal in size. Mild calcification noted at the aortic valve. A right IJ line is noted ending at the right atrium. Mediastinum/Nodes: There is distention of the esophagus with fluid, concerning for esophageal dysmotility. The patient's enteric tube is noted ending at the body of the stomach. The patient's endotracheal tube is seen ending 2 cm above the carina. No mediastinal lymphadenopathy is seen. No pericardial effusion is identified. The thyroid gland is unremarkable. No axillary lymphadenopathy is appreciated. Lungs/Pleura: A small right pleural effusion is noted. There is dense consolidation of the right lower lobe, and patchy airspace opacities within the right upper and middle lobes, concerning for multifocal pneumonia. The left lung remains clear. Rightward mediastinal shift reflects right-sided volume loss. No pneumothorax is seen. Musculoskeletal: No acute osseous abnormalities are identified. The visualized musculature is unremarkable in appearance. The patient is status post left-sided mastectomy. CT ABDOMEN PELVIS FINDINGS Hepatobiliary: The liver is unremarkable in appearance. The patient is status post cholecystectomy, with clips noted at the gallbladder fossa. The common bile duct remains normal in caliber. Pancreas: Vague soft tissue edema is noted about the head and body of the pancreas, concerning for pancreatitis. This is not well assessed without contrast. Spleen: The spleen is diminutive and grossly unremarkable. Adrenals/Urinary Tract: The adrenal glands are grossly unremarkable in appearance. There is no evidence of hydronephrosis. No renal or ureteral stones are identified. Small left renal cysts are noted. No perinephric stranding is  appreciated. Stomach/Bowel: Mesenteric edema is noted, possibly reflecting the suspected pancreatic process. The stomach is grossly unremarkable in appearance. The appendix is not definitely characterized; there is no evidence of appendicitis. Scattered diverticulosis is noted along the sigmoid colon, without evidence of diverticulitis. The small bowel is grossly unremarkable in appearance. Vascular/Lymphatic: Diffuse calcification is seen along the abdominal aorta and its branches. The abdominal aorta is otherwise grossly unremarkable. The inferior vena cava is grossly unremarkable. No retroperitoneal lymphadenopathy is seen. No pelvic sidewall lymphadenopathy is identified. Reproductive: There appears to be a 3.7 cm right-sided dermoid cyst,  containing a small amount of fat. The bladder is decompressed, with a Foley catheter in place. The patient is likely status post hysterectomy. Other:  Soft tissue edema is noted at the right flank. Musculoskeletal: No acute osseous abnormalities are identified. There is chronic compression deformity of vertebral body L2, new from 2006. The visualized musculature is unremarkable in appearance. IMPRESSION: 1. Dense right lower lobe consolidation, and additional patchy airspace opacities within the right upper and middle lobes, compatible with multifocal pneumonia. Would correlate clinically for evidence of aspiration. 2. Small right pleural effusion noted. 3. Vague soft tissue edema about the head and body of the pancreas is concerning for pancreatitis. This is not well assessed without contrast. Underlying mesenteric edema may reflect the suspected pancreatic process. 4. Distention of the esophagus with fluid, concerning for significant esophageal dysmotility. Enteric tube noted ending at the body of the stomach. 5. 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 Recommend annual imaging followup by CTA or MRA. This recommendation follows 2010  ACCF/AHA/AATS/ACR/ASA/SCA/SCAI/SIR/STS/SVM Guidelines for the Diagnosis and Management of Patients with Thoracic Aortic Disease. Circulation. 2010; 121: [id number] 6. Diffuse coronary artery calcifications seen. 7. Scattered diverticulosis along the sigmoid colon, without evidence of diverticulitis. 8. Diffuse aortic atherosclerosis. 9. Apparent 3.7 cm right adnexal dermoid cyst noted, mildly increased in size from 2006. 10. 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 11. Soft tissue edema at the right flank. Electronically Signed   By: Garald Balding M.D.   On: 03/13/2017 02:18   Dg Chest Port 1 View  Result Date: 03/14/2017 CLINICAL DATA:  Endotracheal tube. EXAM: PORTABLE CHEST 1 VIEW COMPARISON:  Chest x-rays dated 03/13/2017 and 03/17/2017. FINDINGS: Vague opacity persists at the right lung base, unchanged. Left lung is clear. No pneumothorax seen. Heart size and mediastinal contours are stable. Atherosclerotic changes noted at the aortic arch. Endotracheal tube remains well positioned with tip just above the level of the carina. Enteric tube passes below the diaphragm. Right IJ central line is adequately positioned with tip overlying the right atrium. IMPRESSION: 1. Stable chest x-ray. Vague opacity persists at the right lung base, compatible with the right lower lobe consolidation and small pleural effusion demonstrated on yesterday's chest CT. The consolidation component could represent atelectasis, aspiration or pneumonia. 2. No new lung findings.  No pneumothorax. 3. Support apparatus is stable in position. 4. Aortic atherosclerosis. Electronically Signed   By: Franki Cabot M.D.   On: 03/14/2017 07:59   Dg Chest Port 1 View  Result Date: 03/13/2017 CLINICAL DATA:  Endotracheal tube present. EXAM: PORTABLE CHEST 1 VIEW COMPARISON:  Chest radiograph and CT 03/13/2017 FINDINGS: Endotracheal tube terminates approximately 4.2 cm above the carina. Enteric tube courses into the  stomach with tip not imaged. Right jugular catheter terminates over the mid upper right atrium. The cardiac silhouette is normal in size. The there is persistent opacity throughout much of the right lung, patchy in the midlung and confluent in the lung base corresponding to right lower lobe collapse and consolidation previously demonstrated. Right lower lobe aeration is somewhat improved with less rightward mediastinal shift consistent with decreased volume loss. There is a small right pleural effusion. The left lung remains clear. No pneumothorax is identified. IMPRESSION: 1. Persistent right mid and lower lung airspace opacity which may reflect infectious or aspiration pneumonia. Suspect some interval improved right lower lobe aeration given evidence of decreased volume loss. 2. Small right pleural effusion. Electronically Signed   By: Zenia Resides  Jeralyn Ruths M.D.   On: 03/13/2017 15:02   Dg Chest Portable 1 View  Result Date: 03/13/2017 CLINICAL DATA:  71 year old female status post intubation and enteric tube placement. EXAM: PORTABLE CHEST 1 VIEW COMPARISON:  Chest radiograph dated 03/10/2017 and CT dated 05/01/2013 FINDINGS: The patient is rotated to the right which limits evaluation for positioning of the support devices. An endotracheal tube appears to be approximately 17 mm above the carina. Recommend retraction of the tube by approximately 3 cm for optimal positioning and repeat radiograph with better position of the patient if possible. An enteric tube extends into the left hemiabdomen with tip beyond the inferior margin of the image and side-port beyond the GE junction. Right IJ central line noted in stable position. The Of the left lung is clear. Evaluation of the right lung is limited due to superimposition of the mediastinal structures secondary to patient rotation. There is however no pleural effusion or pneumothorax. There is atherosclerotic calcification of the thoracic aorta. No acute osseous pathology  identified. IMPRESSION: Limited evaluation due to patient positioning and rotation. The endotracheal tube appears above the carina recommend retraction by approximately 3 cm for optimal positioning. Enteric tube is located in the left hemiabdomen. Electronically Signed   By: Anner Crete M.D.   On: 03/13/2017 00:41   Dg Chest Portable 1 View  Result Date: 02/24/2017 CLINICAL DATA:  Jugular catheter placement. EXAM: PORTABLE CHEST 1 VIEW COMPARISON:  Earlier today. FINDINGS: The patient is markedly rotated to the right. Interval right jugular catheter with its tip in the right atrium. Grossly normal sized heart and clear lungs. Diffuse osteopenia. No pneumothorax seen. IMPRESSION: Right jugular catheter tip in the right atrium without pneumothorax. Electronically Signed   By: Claudie Revering M.D.   On: 03/11/2017 22:45   Dg Chest Port 1 View  Result Date: 03/03/2017 CLINICAL DATA:  Hypertension, possible sepsis. EXAM: PORTABLE CHEST 1 VIEW COMPARISON:  11/18/2016 FINDINGS: Lungs are adequately inflated without consolidation or effusion. Patient is slightly rotated to the right. Cardiomediastinal silhouette is within normal. There is calcified plaque over the thoracic aorta. Remainder of the exam is unchanged. IMPRESSION: No acute cardiopulmonary disease. Electronically Signed   By: Marin Olp M.D.   On: 02/15/2017 20:14       ASSESSMENT and PLAN   ASSESSMENT / PLAN:  PULMONARY A: #baseline: COPD NOS - suppposed to be on o2 but not per daugther #current: RLL PNA and Acute resp failure -? CAP v Aspiration  03/14/2017 -> does not meet sbt critteria due to septic shock and ATN and RLL CAP  P:   Full vent support Lower  RR due to copd  CARDIOVASCULAR A:  #baseline: normal EF 2014 #current refractory circulatory shock at admit ? sepsis  03/14/2017 -> ongoing multiple pressors but improved. Septic shock  P:  contnue levophed and vaso Dc neo off mar MAP goal >  65   RENAL  Intake/Output Summary (Last 24 hours) at 03/14/17 1149 Last data filed at 03/14/17 1100  Gross per 24 hour  Intake          5058.26 ml  Output              485 ml  Net          4573.26 ml    Recent Labs Lab 02/26/2017 1945 03/13/17 0239 03/13/17 1139 03/13/17 1827 03/14/17 0020  CREATININE 0.78 0.73 0.98 1.11* 1.10*     A:   #baseline: creat 0.4mg % #current 0.7mg % in lean  individual c/w AKI. Also acute on chronic hyponatremia with shock (baseline Na 128)  03/14/2017 ->creat worse but RN says urine output has picked up. And also Hypokalemia Hypomagnesemia Hypophosphatemia   P:   LR at 125cc/h to continue Maitnain BP/HR Replete lutes  GASTROINTESTINAL A:   CT 02/16/2017 wiut sugestion of pancreatitis but lipase normal P:   TF to continue PPI  HEMATOLOGIC  Recent Labs Lab 02/26/2017 1945 03/13/17 0239 03/14/17 0545  HGB 10.1* 9.1* 8.3*  HCT 27.4* 25.1* 22.4*  WBC 9.0 10.3 14.6*  PLT 150 157 117*    A:   #RBC: anemia critical illness #Platelet normal #WBC normal  P:  - PRBC for hgb </= 6.9gm%    - exceptions are   -  if ACS susepcted/confirmed then transfuse for hgb </= 8.0gm%,  or    -  active bleeding with hemodynamic instability, then transfuse regardless of hemoglobin value   At at all times try to transfuse 1 unit prbc as possible with exception of active hemorrhage    INFECTIOUS  Recent Labs Lab 03/13/17 1139 03/14/17 0545  PROCALCITON 2.90 6.13    Results for orders placed or performed during the hospital encounter of 03/11/2017  Blood Culture (routine x 2)     Status: None (Preliminary result)   Collection Time: 02/21/2017  7:45 PM  Result Value Ref Range Status   Specimen Description BLOOD LEFT ANTECUBITAL  Final   Special Requests IN PEDIATRIC BOTTLE Blood Culture adequate volume  Final   Culture NO GROWTH < 12 HOURS  Final   Report Status PENDING  Incomplete  Blood Culture (routine x 2)     Status: Abnormal    Collection Time: 03/17/2017  7:50 PM  Result Value Ref Range Status   Specimen Description BLOOD RIGHT ARM  Final   Special Requests   Final    BOTTLES DRAWN AEROBIC ONLY Blood Culture adequate volume   Culture  Setup Time   Final    GRAM POSITIVE COCCI IN CLUSTERS AEROBIC BOTTLE ONLY CRITICAL RESULT CALLED TO, READ BACK BY AND VERIFIED WITH: B MANCHERIL,PHARMD AT 1809 03/13/17 BY L BENFIELD    Culture (A)  Final    STAPHYLOCOCCUS SPECIES (COAGULASE NEGATIVE) THE SIGNIFICANCE OF ISOLATING THIS ORGANISM FROM A SINGLE SET OF BLOOD CULTURES WHEN MULTIPLE SETS ARE DRAWN IS UNCERTAIN. PLEASE NOTIFY THE MICROBIOLOGY DEPARTMENT WITHIN ONE WEEK IF SPECIATION AND SENSITIVITIES ARE REQUIRED.    Report Status 03/14/2017 FINAL  Final  Blood Culture ID Panel (Reflexed)     Status: Abnormal   Collection Time: 03/01/2017  7:50 PM  Result Value Ref Range Status   Enterococcus species NOT DETECTED NOT DETECTED Final   Listeria monocytogenes NOT DETECTED NOT DETECTED Final   Staphylococcus species DETECTED (A) NOT DETECTED Final    Comment: Methicillin (oxacillin) susceptible coagulase negative staphylococcus. Possible blood culture contaminant (unless isolated from more than one blood culture draw or clinical case suggests pathogenicity). No antibiotic treatment is indicated for blood  culture contaminants. CRITICAL RESULT CALLED TO, READ BACK BY AND VERIFIED WITH: B MANCHERIL,PHARMD AT 1809 03/13/17 BY L BENFIELD    Staphylococcus aureus NOT DETECTED NOT DETECTED Final   Methicillin resistance NOT DETECTED NOT DETECTED Final   Streptococcus species NOT DETECTED NOT DETECTED Final   Streptococcus agalactiae NOT DETECTED NOT DETECTED Final   Streptococcus pneumoniae NOT DETECTED NOT DETECTED Final   Streptococcus pyogenes NOT DETECTED NOT DETECTED Final   Acinetobacter baumannii NOT DETECTED NOT DETECTED Final   Enterobacteriaceae species  NOT DETECTED NOT DETECTED Final   Enterobacter cloacae complex NOT  DETECTED NOT DETECTED Final   Escherichia coli NOT DETECTED NOT DETECTED Final   Klebsiella oxytoca NOT DETECTED NOT DETECTED Final   Klebsiella pneumoniae NOT DETECTED NOT DETECTED Final   Proteus species NOT DETECTED NOT DETECTED Final   Serratia marcescens NOT DETECTED NOT DETECTED Final   Haemophilus influenzae NOT DETECTED NOT DETECTED Final   Neisseria meningitidis NOT DETECTED NOT DETECTED Final   Pseudomonas aeruginosa NOT DETECTED NOT DETECTED Final   Candida albicans NOT DETECTED NOT DETECTED Final   Candida glabrata NOT DETECTED NOT DETECTED Final   Candida krusei NOT DETECTED NOT DETECTED Final   Candida parapsilosis NOT DETECTED NOT DETECTED Final   Candida tropicalis NOT DETECTED NOT DETECTED Final  Urine culture     Status: None   Collection Time: 02/18/2017  8:23 PM  Result Value Ref Range Status   Specimen Description URINE, CATHETERIZED  Final   Special Requests NONE  Final   Culture NO GROWTH  Final   Report Status 03/14/2017 FINAL  Final  MRSA PCR Screening     Status: None   Collection Time: 03/13/17  1:59 AM  Result Value Ref Range Status   MRSA by PCR NEGATIVE NEGATIVE Final    Comment:        The GeneXpert MRSA Assay (FDA approved for NASAL specimens only), is one component of a comprehensive MRSA colonization surveillance program. It is not intended to diagnose MRSA infection nor to guide or monitor treatment for MRSA infections.   Respiratory Panel by PCR     Status: None   Collection Time: 03/13/17 12:14 PM  Result Value Ref Range Status   Adenovirus NOT DETECTED NOT DETECTED Final   Coronavirus 229E NOT DETECTED NOT DETECTED Final   Coronavirus HKU1 NOT DETECTED NOT DETECTED Final   Coronavirus NL63 NOT DETECTED NOT DETECTED Final   Coronavirus OC43 NOT DETECTED NOT DETECTED Final   Metapneumovirus NOT DETECTED NOT DETECTED Final   Rhinovirus / Enterovirus NOT DETECTED NOT DETECTED Final   Influenza B NOT DETECTED NOT DETECTED Final    Parainfluenza Virus 1 NOT DETECTED NOT DETECTED Final   Parainfluenza Virus 2 NOT DETECTED NOT DETECTED Final   Parainfluenza Virus 3 NOT DETECTED NOT DETECTED Final   Parainfluenza Virus 4 NOT DETECTED NOT DETECTED Final   Respiratory Syncytial Virus NOT DETECTED NOT DETECTED Final   Bordetella pertussis NOT DETECTED NOT DETECTED Final   Chlamydophila pneumoniae NOT DETECTED NOT DETECTED Final   Mycoplasma pneumoniae NOT DETECTED NOT DETECTED Final   URINE STREP +  A:   RLL PNA - CAP Strep  P:   Await HIV  Anti-infectives    Start     Dose/Rate Route Frequency Ordered Stop   03/13/17 2100  vancomycin (VANCOCIN) IVPB 750 mg/150 ml premix  Status:  Discontinued     750 mg 150 mL/hr over 60 Minutes Intravenous Every 24 hours 03/05/2017 2039 03/14/17 1205   03/13/17 2000  levofloxacin (LEVAQUIN) IVPB 500 mg     500 mg 100 mL/hr over 60 Minutes Intravenous Every 24 hours 03/11/2017 2039     03/13/17 0400  aztreonam (AZACTAM) 1 g in dextrose 5 % 50 mL IVPB  Status:  Discontinued     1 g 100 mL/hr over 30 Minutes Intravenous Every 8 hours 03/15/2017 2039 03/14/17 1205   03/10/2017 2030  vancomycin (VANCOCIN) IVPB 750 mg/150 ml premix     750 mg 150 mL/hr over 60  Minutes Intravenous  Once 03/05/2017 2020 03/11/2017 2200   03/15/2017 2000  levofloxacin (LEVAQUIN) IVPB 750 mg     750 mg 100 mL/hr over 90 Minutes Intravenous  Once 03/15/2017 1946 02/20/2017 2151   02/28/2017 2000  aztreonam (AZACTAM) 2 g in dextrose 5 % 50 mL IVPB     2 g 100 mL/hr over 30 Minutes Intravenous  Once 02/19/2017 1946 02/21/2017 2051   03/17/2017 2000  vancomycin (VANCOCIN) IVPB 1000 mg/200 mL premix  Status:  Discontinued     1,000 mg 200 mL/hr over 60 Minutes Intravenous  Once 03/14/2017 1946 03/14/2017 2020       ENDOCRINE A:   At risk hyoperglycemia  P:   ICU hyperglycemia protocol  NEUROLOGIC A:   #Baseline : nil acute  #admitt: obtunded   03/14/2017 -> on sedation prn and  Showing agitation  P:   RASS goal:  0 Monitor Prn sedation   FAMILY  - Updates: 03/13/2017 --> daughter updated. None bedside 03/14/2017   - Inter-disciplinary family meet or Palliative Care meeting due by:  DAy 7. Current LOS is LOS 2 days   DISPO Keep in ICU     The patient is critically ill with multiple organ systems failure and requires high complexity decision making for assessment and support, frequent evaluation and titration of therapies, application of advanced monitoring technologies and extensive interpretation of multiple databases.   Critical Care Time devoted to patient care services described in this note is  30  Minutes. This time reflects time of care of this signee Dr Brand Males. This critical care time does not reflect procedure time, or teaching time or supervisory time of PA/NP/Med student/Med Resident etc but could involve care discussion time    Dr. Brand Males, M.D., [facility].C.P Pulmonary and Critical Care Medicine Staff Physician [place] [facility] Pager: [phone], If no answer or between  15:00h - 7:00h: call [phone]  03/14/2017 12:06 PM

## 2017-03-14 NOTE — Progress Notes (Signed)
SLP Cancellation Note  Patient Details Name: Kayla Tyler MRN: [id number] DOB: May 28, 1945   Cancelled treatment:       Reason Eval/Treat Not Completed: Medical issues which prohibited therapy. Pt intubated. Will f/u next date.  Deneise Lever, [place], CCC-SLP Speech-Language Pathologist [phone]      Kayla Tyler 03/14/2017, 11:31 AM

## 2017-03-15 DIAGNOSIS — J9601 Acute respiratory failure with hypoxia: Secondary | ICD-10-CM

## 2017-03-15 DIAGNOSIS — L8990 Pressure ulcer of unspecified site, unspecified stage: Secondary | ICD-10-CM | POA: Insufficient documentation

## 2017-03-15 DIAGNOSIS — E876 Hypokalemia: Secondary | ICD-10-CM

## 2017-03-15 LAB — CBC WITH DIFFERENTIAL/PLATELET
BASOS PCT: 0 %
Basophils Absolute: 0 10*3/uL (ref 0.0–0.1)
EOS ABS: 0 10*3/uL (ref 0.0–0.7)
EOS PCT: 0 %
HEMATOCRIT: 20.3 % — AB (ref 36.0–46.0)
Hemoglobin: 7.4 g/dL — ABNORMAL LOW (ref 12.0–15.0)
LYMPHS ABS: 0.7 10*3/uL (ref 0.7–4.0)
Lymphocytes Relative: 3 %
MCH: 34.9 pg — ABNORMAL HIGH (ref 26.0–34.0)
MCHC: 36.5 g/dL — AB (ref 30.0–36.0)
MCV: 95.8 fL (ref 78.0–100.0)
MONO ABS: 1.8 10*3/uL — AB (ref 0.1–1.0)
Monocytes Relative: 8 %
Neutro Abs: 19.9 10*3/uL — ABNORMAL HIGH (ref 1.7–7.7)
Neutrophils Relative %: 89 %
Platelets: 98 10*3/uL — ABNORMAL LOW (ref 150–400)
RBC: 2.12 MIL/uL — AB (ref 3.87–5.11)
RDW: 12.9 % (ref 11.5–15.5)
WBC Morphology: INCREASED
WBC: 22.4 10*3/uL — AB (ref 4.0–10.5)

## 2017-03-15 LAB — BASIC METABOLIC PANEL
ANION GAP: 8 (ref 5–15)
BUN: 23 mg/dL — AB (ref 6–20)
CO2: 23 mmol/L (ref 22–32)
Calcium: 6.3 mg/dL — CL (ref 8.9–10.3)
Chloride: 91 mmol/L — ABNORMAL LOW (ref 101–111)
Creatinine, Ser: 1.16 mg/dL — ABNORMAL HIGH (ref 0.44–1.00)
GFR calc Af Amer: 53 mL/min — ABNORMAL LOW (ref >60)
GFR, EST NON AFRICAN AMERICAN: 46 mL/min — AB (ref >60)
Glucose, Bld: 142 mg/dL — ABNORMAL HIGH (ref 65–99)
POTASSIUM: 3.2 mmol/L — AB (ref 3.5–5.1)
SODIUM: 122 mmol/L — AB (ref 135–145)

## 2017-03-15 LAB — GLUCOSE, CAPILLARY
GLUCOSE-CAPILLARY: 100 mg/dL — AB (ref 65–99)
GLUCOSE-CAPILLARY: 130 mg/dL — AB (ref 65–99)
GLUCOSE-CAPILLARY: 133 mg/dL — AB (ref 65–99)
GLUCOSE-CAPILLARY: 138 mg/dL — AB (ref 65–99)
Glucose-Capillary: 149 mg/dL — ABNORMAL HIGH (ref 65–99)
Glucose-Capillary: 137 mg/dL — ABNORMAL HIGH (ref 65–99)

## 2017-03-15 LAB — PROCALCITONIN
Procalcitonin: 4.79 ng/mL
Procalcitonin: 4.11 ng/mL

## 2017-03-15 LAB — CORTISOL: Cortisol, Plasma: 42.1 ug/dL

## 2017-03-15 LAB — PHOSPHORUS: Phosphorus: 1.3 mg/dL — ABNORMAL LOW (ref 2.5–4.6)

## 2017-03-15 LAB — TRIGLYCERIDES: Triglycerides: 52 mg/dL (ref <150)

## 2017-03-15 LAB — MAGNESIUM: Magnesium: 2 mg/dL (ref 1.7–2.4)

## 2017-03-15 MED ORDER — DEXTROSE 5 % IV SOLN
2.0000 g | INTRAVENOUS | Status: DC
  Administered 2017-03-15: 2 g via INTRAVENOUS
  Filled 2017-03-15 (×2): qty 2

## 2017-03-15 MED ORDER — SODIUM CHLORIDE 0.9 % IV SOLN
INTRAVENOUS | Status: DC
  Administered 2017-03-15 – 2017-03-16 (×3): via INTRAVENOUS

## 2017-03-15 MED ORDER — FENTANYL CITRATE (PF) 100 MCG/2ML IJ SOLN
25.0000 ug | INTRAMUSCULAR | Status: DC | PRN
  Administered 2017-03-20 – 2017-03-22 (×9): 25 ug via INTRAVENOUS
  Filled 2017-03-15 (×10): qty 2

## 2017-03-15 MED ORDER — PANTOPRAZOLE SODIUM 40 MG PO PACK
40.0000 mg | PACK | Freq: Every day | ORAL | Status: DC
  Administered 2017-03-16 – 2017-03-22 (×7): 40 mg
  Filled 2017-03-15 (×7): qty 20

## 2017-03-15 MED ORDER — POTASSIUM PHOSPHATES 15 MMOLE/5ML IV SOLN
30.0000 mmol | Freq: Once | INTRAVENOUS | Status: AC
  Administered 2017-03-15: 30 mmol via INTRAVENOUS
  Filled 2017-03-15: qty 10

## 2017-03-15 NOTE — Progress Notes (Signed)
CRITICAL VALUE ALERT  Critical Value:  Calcium 6.3   Date & Time Notied:  03/14/2017 @1300   Provider Notified: Dr. Nelda Marseille

## 2017-03-15 NOTE — Consult Note (Signed)
   [facility] Inpatient Consult   03/15/2017  Kayla Tyler 07-10-1945 [id number]  Consult received for patient in the [facility].  Chart review that the patient is currently intubated.  Will continue to follow for referral needs at the appropriate time and appropriate needs for disposition which is currently not determined.  Please contact for changes,  Natividad Brood, RN BSN [facility] Liaison  [phone] business mobile phone Toll free office [phone]

## 2017-03-15 NOTE — Progress Notes (Addendum)
Pharmacy Antibiotic Note  Kayla Tyler is a 71 y.o. female with a history of COPD admitted on 03/08/2017 with sepsis.  Positive strep pneumoniae urinary antigen, legionella pending. RVP negative. WBC elevated at 22.4, pt is afebrile, PCT 4.79. BCx 1/2 Coag neg staph. Pharmacy consulted to dose Ceftriaxone. New BCx ordered. Pt does have a PCN allergy listed, the reaction is passing out.   Scr trending up, now 1.10 (Baseline 0.4).   Plan:  Discontinue Levofloxacin  Start Ceftriaxone 2g Q24h  Monitor LOT, c/s, clinical resolution Monitor pt for medication reaction   Height: 5' (152.4 cm) Weight: 97 lb (44 kg) IBW/kg (Calculated) : 45.5  Temp (24hrs), Avg:98.5 F (36.9 C), Min:97.5 F (36.4 C), Max:99 F (37.2 C)   Recent Labs Lab 02/28/2017 1945  02/26/2017 2313 03/13/17 0239 03/13/17 0340 03/13/17 1139 03/13/17 1630 03/13/17 1827 03/14/17 0020 03/14/17 0545 03/15/17 0400  WBC 9.0  --   --  10.3  --   --   --   --   --  14.6* 22.4*  CREATININE 0.78  --   --  0.73  --  0.98  --  1.11* 1.10*  --   --   LATICACIDVEN  --   < > 2.33*  --  2.1* 5.0* 3.3*  --  2.6*  --   --   < > = values in this interval not displayed.  Estimated Creatinine Clearance: 32.1 mL/min (A) (by C-G formula based on SCr of 1.1 mg/dL (H)).    Allergies  Allergen Reactions  . Lisinopril Swelling    Lip swelling (reaction to lisinopril/hctz)  . Penicillins Other (See Comments)    Pt passes out. Has patient had a PCN reaction causing immediate rash, facial/tongue/throat swelling, SOB or lightheadedness with hypotension: No Has patient had a PCN reaction causing severe rash involving mucus membranes or skin necrosis: No Has patient had a PCN reaction that required hospitalization yes Has patient had a PCN reaction occurring within the last 10 years: No If all of the above answers are "NO", then may proceed with Cephalosporin use.    Antimicrobials this admission: Vancomycin 10/26 >> 10/28 Aztreonam  10/26 >> 10/28 Levofloxacin 10/26>>10/29 Ceftriaxone 10/29 >>  Microbiology results: 10/26 BCx: 1/2 Coag neg staph 10/26 UCx: negative 10/29 BCx:    Leroy Libman, PharmD Pharmacy Resident Pager: [phone]

## 2017-03-15 NOTE — Progress Notes (Signed)
PULMONARY / CRITICAL CARE MEDICINE   Name: Kayla Tyler MRN: [id number] DOB: Dec 10, 1945 PCP Tawny Asal, MD LOS 3 as of 03/15/2017     ADMISSION DATE:  03/02/2017 CONSULTATION DATE:  03/15/2017   REFERRING MD:  ER  CHIEF COMPLAINT:  Encephalopathy, vdrf, sepsis  BRIEF 71 yo F presented to the ED 10/26 because of altered mental status. Patient is poor historian, so the hx was taken from the records and her daughter. Patient had decreased oral intake for the lasy few days, however she was alert and interactive. But on the day of admission was confused, EMS was called and found the patient to be hypothermic, bradycardiac and hypotensive. Patient intubated in the ED for airway protection.    EVENT 03/13/2017 - intubated. On levophed 77mcg, vaso 0.03 and neo at 144mcg. 60% fio2 on =vent. Dauhter at bedside - reports daily etoh and cig. Admits to copd hx. Reports that annually gets flu/pna this time of the year 03/14/17 - BCID staph sepcied detected - coag neg, MRSA PCR negative. URINE STREP ++ , RVP NEGATIVe.  Lowering pressor needs. Off neo. AKI worse but RN says making urine    SUBJECTIVE/OVERNIGHT/INTERVAL HX No change overnight.     VITAL SIGNS: BP (!) 83/41   Pulse 100   Temp 98.8 F (37.1 C)   Resp 13   Ht 5' (1.524 m)   Wt 44 kg (97 lb)   SpO2 100%   BMI 18.94 kg/m   HEMODYNAMICS:    VENTILATOR SETTINGS: Vent Mode: PRVC FiO2 (%):  [30 %] 30 % Set Rate:  [16 bmp] 16 bmp Vt Set:  [400 mL] 400 mL PEEP:  [5 cmH20] 5 cmH20 Pressure Support:  [10 cmH20] 10 cmH20 Plateau Pressure:  [14 cmH20-25 cmH20] 23 cmH20  INTAKE / OUTPUT: I/O last 3 completed shifts: In: 7562.2 [I.V.:5942.2; NG/GT:1620] Out: 62 [Urine:615]     EXAM  General:  Thin, frail, chronically ill appearing female, NAD on vent  HEENT: MM pink/moist Neuro: awake, alert, follows all commands, nods appropriately, Kayla Tyler, gen weakness  CV: s1s2 rrr, no m/r/g PULM: even/non-labored, lungs  bilaterally diminished, few scattered rhonchi R>L  QQ:VZDG, non-tender, bsx4 active  Extremities: warm/dry, no edema  Skin: no rashes or lesions    LABS  PULMONARY  Recent Labs Lab 03/13/17 0359 03/13/17 0624 03/13/17 1121 03/13/17 1353 03/14/17 1230  PHART 7.112* 7.318* 7.387 7.358 7.283*  PCO2ART 70.1* 49.6* 30.4* 36.4 42.8  PO2ART 69.0* 57.0* 69.0* 67.0* 76.0*  HCO3 22.8 25.4 18.3* 20.4 20.2  TCO2 25 27 19* 21* 22  O2SAT 88.0 86.0 94.0 92.0 93.0    CBC  Recent Labs Lab 03/13/17 0239 03/14/17 0545 03/15/17 0400  HGB 9.1* 8.3* 7.4*  HCT 25.1* 22.4* 20.3*  WBC 10.3 14.6* 22.4*  PLT 157 117* 98*    COAGULATION  Recent Labs Lab 03/14/17 0545  INR 1.80    CARDIAC    Recent Labs Lab 03/14/17 0545  TROPONINI 0.06*   No results for input(s): PROBNP in the last 168 hours.   CHEMISTRY  Recent Labs Lab 03/06/2017 1945 03/13/17 0239 03/13/17 1139 03/13/17 1827 03/14/17 0020 03/14/17 0545 03/15/17 0400  NA 106* 108* 117* 118* 117*  --   --   K 4.6 3.9 3.4* 3.4* 3.4*  --   --   CL 71* 77* 88* 88* 88*  --   --   CO2 23 24 20* 22 20*  --   --   GLUCOSE 61* 126* 98 107*  170*  --   --   BUN 8 11 15 14 15   --   --   CREATININE 0.78 0.73 0.98 1.11* 1.10*  --   --   CALCIUM 7.2* 6.3* 6.5* 6.6* 6.5*  --   --   MG  --   --   --   --   --  1.1* 2.0  PHOS  --   --   --   --   --  1.3* 1.3*   Estimated Creatinine Clearance: 32.1 mL/min (A) (by C-G formula based on SCr of 1.1 mg/dL (H)).  I/O last 3 completed shifts: In: 7562.2 [I.V.:5942.2; NG/GT:1620] Out: 615 [Urine:615]   Intake/Output Summary (Last 24 hours) at 03/15/17 1128 Last data filed at 03/15/17 1000  Gross per 24 hour  Intake          4650.09 ml  Output              425 ml  Net          4225.09 ml     LIVER  Recent Labs Lab 02/23/2017 1945 03/14/17 0545  AST 198* 323*  ALT 58* 88*  ALKPHOS 244* 131*  BILITOT 1.7* 1.0  PROT 5.1* 4.0*  ALBUMIN 2.0* 1.4*  INR  --  1.80      INFECTIOUS  Recent Labs Lab 03/13/17 1139 03/13/17 1630 03/14/17 0020 03/14/17 0545 03/15/17 0400  LATICACIDVEN 5.0* 3.3* 2.6*  --   --   PROCALCITON 2.90  --   --  6.13 4.79     ENDOCRINE CBG (last 3)   Recent Labs  03/14/17 2348 03/15/17 0414 03/15/17 0836  GLUCAP 149* 149* 138*       IMAGING x48h  - image(s) personally visualized  -   highlighted in bold Dg Chest Port 1 View  Result Date: 03/14/2017 CLINICAL DATA:  Endotracheal tube. EXAM: PORTABLE CHEST 1 VIEW COMPARISON:  Chest x-rays dated 03/13/2017 and 02/23/2017. FINDINGS: Vague opacity persists at the right lung base, unchanged. Left lung is clear. No pneumothorax seen. Heart size and mediastinal contours are stable. Atherosclerotic changes noted at the aortic arch. Endotracheal tube remains well positioned with tip just above the level of the carina. Enteric tube passes below the diaphragm. Right IJ central line is adequately positioned with tip overlying the right atrium. IMPRESSION: 1. Stable chest x-ray. Vague opacity persists at the right lung base, compatible with the right lower lobe consolidation and small pleural effusion demonstrated on yesterday's chest CT. The consolidation component could represent atelectasis, aspiration or pneumonia. 2. No new lung findings.  No pneumothorax. 3. Support apparatus is stable in position. 4. Aortic atherosclerosis. Electronically Signed   By: Franki Cabot M.D.   On: 03/14/2017 07:59   Dg Chest Port 1 View  Result Date: 03/13/2017 CLINICAL DATA:  Endotracheal tube present. EXAM: PORTABLE CHEST 1 VIEW COMPARISON:  Chest radiograph and CT 03/13/2017 FINDINGS: Endotracheal tube terminates approximately 4.2 cm above the carina. Enteric tube courses into the stomach with tip not imaged. Right jugular catheter terminates over the mid upper right atrium. The cardiac silhouette is normal in size. The there is persistent opacity throughout much of the right lung, patchy in  the midlung and confluent in the lung base corresponding to right lower lobe collapse and consolidation previously demonstrated. Right lower lobe aeration is somewhat improved with less rightward mediastinal shift consistent with decreased volume loss. There is a small right pleural effusion. The left lung remains clear. No pneumothorax is identified.  IMPRESSION: 1. Persistent right mid and lower lung airspace opacity which may reflect infectious or aspiration pneumonia. Suspect some interval improved right lower lobe aeration given evidence of decreased volume loss. 2. Small right pleural effusion. Electronically Signed   By: Logan Bores M.D.   On: 03/13/2017 15:02     ABX:  Vanc 10/26>>>10/27 Aztreonam 10/26>>>10/27 levquin 10/27>>>10/29 Rocephin 10/29>>>  MICRO:  RVP 10/28>>> NEG  BC x 2 10/26>>> 1/2 coag neg staph Urine strep 10/27>>> POS   LINES/Tubes:  ETT 10/26>>> R IJ CVL 10/26>>>  STUDIES:  CT chest/abd/pelvis 10/27>>> 1. Dense right lower lobe consolidation, and additional patchy airspace opacities within the right upper and middle lobes, compatible with multifocal pneumonia. Would correlate clinically for evidence of aspiration. 2. Small right pleural effusion noted. 3. Vague soft tissue edema about the head and body of the pancreas is concerning for pancreatitis. This is not well assessed without contrast. Underlying mesenteric edema may reflect the suspected pancreatic process. 4. Distention of the esophagus with fluid, concerning for significant esophageal dysmotility. Enteric tube noted ending at the body of the stomach. 5. 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 Recommend annual imaging followup by CTA or MRA. 6. Diffuse coronary artery calcifications seen. 7. Scattered diverticulosis along the sigmoid colon, without evidence of diverticulitis. 8. Diffuse aortic atherosclerosis. 9. 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 10. 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 11. Soft tissue edema at the right flank.   ASSESSMENT / PLAN:  PULMONARY A: Acute respiratory failure  Dense RLL PNA -- CAP +/- Aspiration PNA  Hx COPD  P:   Vent support - 8cc/kg   F/u CXR  F/u ABG PS wean as tol - tolerating PS 10/5 now, was sleepy after PRN fentanyl, will retry 5/5  duonebs  Hold home spiriva  Will need speech for swallow eval post extubation   CARDIOVASCULAR A:  Shock - presumed septic v hypovolemic with poor po intake.  P:  Continue to wean pressors as able  Check CVP now  MAP goal > 65 Broaden abx as below  Trend pct, lactate   RENAL  AKI  Chronic hyponatremia  Hypophosphatemia  Hypokalemia   P:   BMET now  Needs Phos supplement - will await chem to assess K - likely needs kphos  Change MIVF to NS @100   Maitnain BP/HR Replete lytes PRN    GASTROINTESTINAL A:   Some suggestion of pancreatitis on CT abd - lipase normal  P:   Continue TF as tol  PPI  HEMATOLOGIC  Anemia - hgb continues to trend down.  No obvious s/s bleeding  P:  SQ heparin  F/u CBC  Transfuse for hgb <7  INFECTIOUS  RLL CAP  URINE STREP + ?pancreatitis  Septic shock - remains pressor dependent. WBC treding up.   P:   Await HIV Change levaquin to rocephin  Repeat blood cultures -suspect coag neg staph contaminant  Trend pct   ENDOCRINE A:   At risk hyperglycemia  P:   ICU hyperglycemia protocol  NEUROLOGIC AMS - likely r/t respiratory failure/?hypercarbia and hypotension. Much improved.   P:   RASS goal: 0 Monitor Prn fentanyl    FAMILY  - Updates: no family at bedside 10/29  - Inter-disciplinary family meet or Palliative Care meeting due by:  Day 7. Current LOS is LOS 3 days   DISPO Keep in ICU  Nickolas Madrid, NP 03/15/2017  11:28 AM Pager: [phone] or [phone]  Attending Note:  71 year old  female with PMH above who presents to [facility] with PNA, VDRF and septic shock.  On exam, coarse BS diffusely.  I reviewed CXR myself, infiltrate noted.  Discussed with PCCM-NP.  Patient continues to require high dose pressors.  Will proceed with weaning attempt via PS but no extubation given hemodynamics.  SBT ordered for AM.  Continue abx for now.  Hold in the ICU.  The patient is critically ill with multiple organ systems failure and requires high complexity decision making for assessment and support, frequent evaluation and titration of therapies, application of advanced monitoring technologies and extensive interpretation of multiple databases.   Critical Care Time devoted to patient care services described in this note is  35  Minutes. This time reflects time of care of this signee Dr Jennet Maduro. This critical care time does not reflect procedure time, or teaching time or supervisory time of PA/NP/Med student/Med Resident etc but could involve care discussion time.  Rush Farmer, M.D. [facility] Pulmonary/Critical Care Medicine. Pager: [phone]. After hours pager: [phone].

## 2017-03-15 NOTE — Progress Notes (Signed)
SLP Cancellation Note  Patient Details Name: Kayla Tyler MRN: [id number] DOB: [birth year]   Cancelled treatment:       Reason Eval/Treat Not Completed: Medical issues which prohibited therapy; remains intubated.  Will follow along for readiness for swallow evaluation.   Juan Quam Laurice 03/15/2017, 12:16 PM

## 2017-03-16 ENCOUNTER — Inpatient Hospital Stay (HOSPITAL_COMMUNITY): Payer: Medicare Other

## 2017-03-16 DIAGNOSIS — J189 Pneumonia, unspecified organism: Secondary | ICD-10-CM

## 2017-03-16 LAB — URINALYSIS, ROUTINE W REFLEX MICROSCOPIC
Bilirubin Urine: NEGATIVE
Glucose, UA: NEGATIVE mg/dL
Ketones, ur: NEGATIVE mg/dL
Nitrite: NEGATIVE
Protein, ur: NEGATIVE mg/dL
SPECIFIC GRAVITY, URINE: 1.008 (ref 1.005–1.030)
pH: 5 (ref 5.0–8.0)

## 2017-03-16 LAB — CBC WITH DIFFERENTIAL/PLATELET
BASOS ABS: 0 10*3/uL (ref 0.0–0.1)
Basophils Relative: 0 %
EOS ABS: 0 10*3/uL (ref 0.0–0.7)
Eosinophils Relative: 0 %
HEMATOCRIT: 20.4 % — AB (ref 36.0–46.0)
HEMOGLOBIN: 7.4 g/dL — AB (ref 12.0–15.0)
LYMPHS PCT: 6 %
Lymphs Abs: 1.6 10*3/uL (ref 0.7–4.0)
MCH: 35.1 pg — ABNORMAL HIGH (ref 26.0–34.0)
MCHC: 36.3 g/dL — AB (ref 30.0–36.0)
MCV: 96.7 fL (ref 78.0–100.0)
MONOS PCT: 7 %
Monocytes Absolute: 1.8 10*3/uL — ABNORMAL HIGH (ref 0.1–1.0)
NEUTROS ABS: 22.6 10*3/uL — AB (ref 1.7–7.7)
NEUTROS PCT: 87 %
Platelets: 93 10*3/uL — ABNORMAL LOW (ref 150–400)
RBC: 2.11 MIL/uL — AB (ref 3.87–5.11)
RDW: 13.2 % (ref 11.5–15.5)
WBC: 26 10*3/uL — ABNORMAL HIGH (ref 4.0–10.5)

## 2017-03-16 LAB — GLUCOSE, CAPILLARY
GLUCOSE-CAPILLARY: 134 mg/dL — AB (ref 65–99)
GLUCOSE-CAPILLARY: 125 mg/dL — AB (ref 65–99)
Glucose-Capillary: 126 mg/dL — ABNORMAL HIGH (ref 65–99)
Glucose-Capillary: 128 mg/dL — ABNORMAL HIGH (ref 65–99)
Glucose-Capillary: 135 mg/dL — ABNORMAL HIGH (ref 65–99)
Glucose-Capillary: 114 mg/dL — ABNORMAL HIGH (ref 65–99)

## 2017-03-16 LAB — PHOSPHORUS: PHOSPHORUS: 2.9 mg/dL (ref 2.5–4.6)

## 2017-03-16 LAB — BASIC METABOLIC PANEL
ANION GAP: 7 (ref 5–15)
BUN: 24 mg/dL — ABNORMAL HIGH (ref 6–20)
CALCIUM: 6.5 mg/dL — AB (ref 8.9–10.3)
CO2: 21 mmol/L — ABNORMAL LOW (ref 22–32)
Chloride: 92 mmol/L — ABNORMAL LOW (ref 101–111)
Creatinine, Ser: 1.11 mg/dL — ABNORMAL HIGH (ref 0.44–1.00)
GFR, EST AFRICAN AMERICAN: 56 mL/min — AB (ref >60)
GFR, EST NON AFRICAN AMERICAN: 48 mL/min — AB (ref >60)
Glucose, Bld: 134 mg/dL — ABNORMAL HIGH (ref 65–99)
POTASSIUM: 3.6 mmol/L (ref 3.5–5.1)
SODIUM: 120 mmol/L — AB (ref 135–145)

## 2017-03-16 LAB — PROCALCITONIN: Procalcitonin: 2.63 ng/mL

## 2017-03-16 LAB — MAGNESIUM: Magnesium: 1.7 mg/dL (ref 1.7–2.4)

## 2017-03-16 MED ORDER — VANCOMYCIN HCL IN DEXTROSE 750-5 MG/150ML-% IV SOLN
750.0000 mg | INTRAVENOUS | Status: DC
  Administered 2017-03-17 – 2017-03-22 (×6): 750 mg via INTRAVENOUS
  Filled 2017-03-16 (×7): qty 150

## 2017-03-16 MED ORDER — SODIUM CHLORIDE 0.9% FLUSH: 10.0000 mL | INTRAVENOUS | Status: DC | PRN

## 2017-03-16 MED ORDER — VANCOMYCIN HCL IN DEXTROSE 1-5 GM/200ML-% IV SOLN
1000.0000 mg | Freq: Once | INTRAVENOUS | Status: AC
  Administered 2017-03-16: 1000 mg via INTRAVENOUS
  Filled 2017-03-16: qty 200

## 2017-03-16 MED ORDER — METOLAZONE 5 MG PO TABS
5.0000 mg | ORAL_TABLET | Freq: Every day | ORAL | Status: AC
  Administered 2017-03-16: 5 mg via ORAL
  Filled 2017-03-16: qty 1

## 2017-03-16 MED ORDER — DEXTROSE 5 % IV SOLN
1.0000 g | INTRAVENOUS | Status: DC
  Administered 2017-03-17 – 2017-03-23 (×7): 1 g via INTRAVENOUS
  Filled 2017-03-16 (×7): qty 1

## 2017-03-16 MED ORDER — DEXTROSE 5 % IV SOLN
1.0000 g | INTRAVENOUS | Status: DC
  Filled 2017-03-16: qty 1

## 2017-03-16 MED ORDER — FUROSEMIDE 10 MG/ML IJ SOLN
40.0000 mg | Freq: Four times a day (QID) | INTRAMUSCULAR | Status: AC
  Administered 2017-03-16 – 2017-03-17 (×3): 40 mg via INTRAVENOUS
  Filled 2017-03-16 (×3): qty 4

## 2017-03-16 MED ORDER — ALBUMIN HUMAN 25 % IV SOLN
25.0000 g | Freq: Four times a day (QID) | INTRAVENOUS | Status: AC
  Administered 2017-03-16 – 2017-03-17 (×4): 25 g via INTRAVENOUS
  Filled 2017-03-16 (×4): qty 100

## 2017-03-16 MED ORDER — POTASSIUM PHOSPHATES 15 MMOLE/5ML IV SOLN
30.0000 mmol | Freq: Once | INTRAVENOUS | Status: AC
  Administered 2017-03-16: 30 mmol via INTRAVENOUS
  Filled 2017-03-16: qty 10

## 2017-03-16 MED ORDER — DEXTROSE 5 % IV SOLN
1.0000 g | Freq: Once | INTRAVENOUS | Status: AC
  Administered 2017-03-16: 1 g via INTRAVENOUS
  Filled 2017-03-16: qty 1

## 2017-03-16 MED ORDER — CHLORHEXIDINE GLUCONATE CLOTH 2 % EX PADS
6.0000 | MEDICATED_PAD | Freq: Every day | CUTANEOUS | Status: DC
  Administered 2017-03-17 – 2017-03-22 (×7): 6 via TOPICAL

## 2017-03-16 MED ORDER — MAGNESIUM SULFATE 2 GM/50ML IV SOLN
INTRAVENOUS | Status: AC
  Filled 2017-03-16: qty 50

## 2017-03-16 MED ORDER — MAGNESIUM SULFATE 2 GM/50ML IV SOLN
2.0000 g | Freq: Once | INTRAVENOUS | Status: AC
  Administered 2017-03-16: 2 g via INTRAVENOUS

## 2017-03-16 NOTE — Progress Notes (Signed)
Pharmacy Antibiotic Note  Kayla Tyler is a 71 y.o. female with a history of COPD admitted on 02/24/2017 with sepsis.  Positive strep pneumoniae urinary antigen, legionella pending. RVP negative. Patient has not clinically improved. WBC still trending up, now at 26. Pt is afebrile, PCT 2.63. BCx pending. Pharmacy consulted to dose Vancomycin and Cefepime. Pt does have a PCN allergy listed, the reaction is passing out.   Scr trending up, now 1.11 (Baseline 0.4).   Plan:  Vancomycin 1g IV bolus x 1, then Vancomycin 750 mg Q24 thereafter Cefepime 1g Q24 Monitor LOT, c/s, clinical resolution Monitor pt for medication reaction   Height: 5' (152.4 cm) Weight: 124 lb 1.9 oz (56.3 kg) IBW/kg (Calculated) : 45.5  Temp (24hrs), Avg:98.8 F (37.1 C), Min:98.2 F (36.8 C), Max:99.1 F (37.3 C)   Recent Labs Lab 02/22/2017 1945  03/03/2017 2313 03/13/17 0239 03/13/17 0340 03/13/17 1139 03/13/17 1630 03/13/17 1827 03/14/17 0020 03/14/17 0545 03/15/17 0400 03/15/17 1150 03/16/17 0437  WBC 9.0  --   --  10.3  --   --   --   --   --  14.6* 22.4*  --  26.0*  CREATININE 0.78  --   --  0.73  --  0.98  --  1.11* 1.10*  --   --  1.16* 1.11*  LATICACIDVEN  --   < > 2.33*  --  2.1* 5.0* 3.3*  --  2.6*  --   --   --   --   < > = values in this interval not displayed.  Estimated Creatinine Clearance: 36 mL/min (A) (by C-G formula based on SCr of 1.11 mg/dL (H)).    Allergies  Allergen Reactions  . Lisinopril Swelling    Lip swelling (reaction to lisinopril/hctz)  . Penicillins Other (See Comments)    Pt passes out. Has patient had a PCN reaction causing immediate rash, facial/tongue/throat swelling, SOB or lightheadedness with hypotension: No Has patient had a PCN reaction causing severe rash involving mucus membranes or skin necrosis: No Has patient had a PCN reaction that required hospitalization yes Has patient had a PCN reaction occurring within the last 10 years: No If all of the above  answers are "NO", then may proceed with Cephalosporin use.    Antimicrobials this admission: Vancomycin 10/26 >> 10/28; 10/30>> Aztreonam 10/26 >> 10/28 Levofloxacin 10/26>>10/29 Ceftriaxone 10/29 >>10/30 Cefepime 10/30>>  Microbiology results: 10/26 BCx: 1/2 Coag neg staph 10/26 UCx: negative 10/29 BCx:    Leroy Libman, PharmD Pharmacy Resident Pager: [phone]

## 2017-03-16 NOTE — Progress Notes (Signed)
PULMONARY / CRITICAL CARE MEDICINE   Name: Kayla Tyler MRN: [id number] DOB: 1946/03/16 PCP Tawny Asal, MD LOS 4 as of 03/16/2017     ADMISSION DATE:  03/10/2017 CONSULTATION DATE:  03/16/2017   REFERRING MD:  ER  CHIEF COMPLAINT:  Encephalopathy, vdrf, sepsis  BRIEF 71 yo F presented to the ED 10/26 because of altered mental status. Patient is poor historian, so the hx was taken from the records and her daughter. Patient had decreased oral intake for the lasy few days, however she was alert and interactive. But on the day of admission was confused, EMS was called and found the patient to be hypothermic, bradycardiac and hypotensive. Patient intubated in the ED for airway protection.   EVENT 03/13/2017 - intubated. On levophed 75mcg, vaso 0.03 and neo at 177mcg. 60% fio2 on =vent. Dauhter at bedside - reports daily etoh and cig. Admits to copd hx. Reports that annually gets flu/pna this time of the year 03/14/17 - BCID staph sepcied detected - coag neg, MRSA PCR negative. URINE STREP ++ , RVP NEGATIVe.  Lowering pressor needs. Off neo. AKI worse but RN says making urine   SUBJECTIVE/OVERNIGHT/INTERVAL HX No change overnight.   VITAL SIGNS: BP 99/66   Pulse 99   Temp 98.8 F (37.1 C)   Resp 17   Ht 5' (1.524 m)   Wt 56.3 kg (124 lb 1.9 oz)   SpO2 100%   BMI 24.24 kg/m   HEMODYNAMICS: CVP:  [10 mmHg-11 mmHg] 11 mmHg  VENTILATOR SETTINGS: Vent Mode: PSV;CPAP FiO2 (%):  [30 %] 30 % Set Rate:  [16 bmp] 16 bmp Vt Set:  [400 mL] 400 mL PEEP:  [5 cmH20] 5 cmH20 Pressure Support:  [5 cmH20-10 cmH20] 10 cmH20 Plateau Pressure:  [25 cmH20-29 cmH20] 25 cmH20  INTAKE / OUTPUT: I/O last 3 completed shifts: In: 7700.5 [I.V.:5495.5; NG/GT:1695; IV [id number]:759] Out: 163 [Urine:655]     EXAM  General:  Thin, frail, chronically ill appearing female, NAD on vent  HEENT: MM pink/moist Neuro: awake, alert, follows all commands, nods appropriately, [place], gen weakness  CV:  s1s2 rrr, no m/r/g PULM: even/non-labored, lungs bilaterally diminished, few scattered rhonchi R>L  WG:YKZL, non-tender, bsx4 active  Extremities: warm/dry, no edema  Skin: no rashes or lesions    LABS  PULMONARY  Recent Labs Lab 03/13/17 0359 03/13/17 0624 03/13/17 1121 03/13/17 1353 03/14/17 1230  PHART 7.112* 7.318* 7.387 7.358 7.283*  PCO2ART 70.1* 49.6* 30.4* 36.4 42.8  PO2ART 69.0* 57.0* 69.0* 67.0* 76.0*  HCO3 22.8 25.4 18.3* 20.4 20.2  TCO2 25 27 19* 21* 22  O2SAT 88.0 86.0 94.0 92.0 93.0    CBC  Recent Labs Lab 03/14/17 0545 03/15/17 0400 03/16/17 0437  HGB 8.3* 7.4* 7.4*  HCT 22.4* 20.3* 20.4*  WBC 14.6* 22.4* 26.0*  PLT 117* 98* 93*    COAGULATION  Recent Labs Lab 03/14/17 0545  INR 1.80    CARDIAC    Recent Labs Lab 03/14/17 0545  TROPONINI 0.06*   No results for input(s): PROBNP in the last 168 hours.   CHEMISTRY  Recent Labs Lab 03/13/17 1139 03/13/17 1827 03/14/17 0020 03/14/17 0545 03/15/17 0400 03/15/17 1150 03/16/17 0437  NA 117* 118* 117*  --   --  122* 120*  K 3.4* 3.4* 3.4*  --   --  3.2* 3.6  CL 88* 88* 88*  --   --  91* 92*  CO2 20* 22 20*  --   --  23 21*  GLUCOSE 98 107* 170*  --   --  142* 134*  BUN 15 14 15   --   --  23* 24*  CREATININE 0.98 1.11* 1.10*  --   --  1.16* 1.11*  CALCIUM 6.5* 6.6* 6.5*  --   --  6.3* 6.5*  MG  --   --   --  1.1* 2.0  --  1.7  PHOS  --   --   --  1.3* 1.3*  --  2.9   Estimated Creatinine Clearance: 36 mL/min (A) (by C-G formula based on SCr of 1.11 mg/dL (H)).  I/O last 3 completed shifts: In: 7700.5 [I.V.:5495.5; NG/GT:1695; IV [id number]:026] Out: 655 [Urine:655]   Intake/Output Summary (Last 24 hours) at 03/16/17 1116 Last data filed at 03/16/17 1000  Gross per 24 hour  Intake          4461.15 ml  Output              530 ml  Net          3931.15 ml     LIVER  Recent Labs Lab 02/22/2017 1945 03/14/17 0545  AST 198* 323*  ALT 58* 88*  ALKPHOS 244* 131*   BILITOT 1.7* 1.0  PROT 5.1* 4.0*  ALBUMIN 2.0* 1.4*  INR  --  1.80     INFECTIOUS  Recent Labs Lab 03/13/17 1139 03/13/17 1630 03/14/17 0020  03/15/17 0400 03/15/17 1150 03/16/17 0437  LATICACIDVEN 5.0* 3.3* 2.6*  --   --   --   --   PROCALCITON 2.90  --   --   < > 4.79 4.11 2.63  < > = values in this interval not displayed.   ENDOCRINE CBG (last 3)   Recent Labs  03/15/17 2358 03/16/17 0316 03/16/17 0809  GLUCAP 100* 134* 125*   IMAGING x48h  - image(s) personally visualized  -   highlighted in bold Dg Chest Portable 1 View  Result Date: 03/16/2017 CLINICAL DATA:  Community acquired pneumonia EXAM: PORTABLE CHEST 1 VIEW COMPARISON:  03/14/2017 FINDINGS: Endotracheal tube tip is above the carina. There is an enteric tube looped in the stomach. Right IJ catheter tip is in the low right atrium. Normal heart size. Aortic atherosclerosis. Persistent moderate to large right pleural effusion and mild diffuse edema. Airspace consolidation within the right midlung and right base persists. IMPRESSION: 1. No change in aeration to the right midlung and right base compared with previous exam. Electronically Signed   By: Kerby Moors M.D.   On: 03/16/2017 07:46   ABX:  Vanc 10/26>>>10/27>>>10/30>>> Aztreonam 10/26>>>10/27 levquin 10/27>>>10/29 Rocephin 10/29>>> Cefepime 10/30>>>  MICRO:  RVP 10/28>>> NEG  BC x 2 10/26>>> 1/2 coag neg staph Urine strep 10/27>>> POS   LINES/Tubes:  ETT 10/26>>> R IJ CVL 10/26>>>  STUDIES:  CT chest/abd/pelvis 10/27>>> 1. Dense right lower lobe consolidation, and additional patchy airspace opacities within the right upper and middle lobes, compatible with multifocal pneumonia. Would correlate clinically for evidence of aspiration. 2. Small right pleural effusion noted. 3. Vague soft tissue edema about the head and body of the pancreas is concerning for pancreatitis. This is not well assessed without contrast. Underlying mesenteric  edema may reflect the suspected pancreatic process. 4. Distention of the esophagus with fluid, concerning for significant esophageal dysmotility. Enteric tube noted ending at the body of the stomach. 5. 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 Recommend annual imaging followup by CTA or MRA. 6. Diffuse coronary  artery calcifications seen. 7. Scattered diverticulosis along the sigmoid colon, without evidence of diverticulitis. 8. Diffuse aortic atherosclerosis. 9. Apparent 3.7 cm right adnexal dermoid cyst noted, mildly increased in size from 2006. 10. 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 11. Soft tissue edema at the right flank.   ASSESSMENT / PLAN:  PULMONARY A: Acute respiratory failure  Dense RLL PNA -- CAP +/- Aspiration PNA  Hx COPD  P:   Vent support - 8cc/kg   Active diureses today F/u CXR  F/u ABG PS as tolerated, no extubation given hemodynamics Duonebs  Hold home spiriva  Will need speech for swallow eval post extubation   CARDIOVASCULAR A:  Shock - presumed septic v hypovolemic with poor po intake.  P:  Continue to wean pressors as able  Check CVP now  MAP goal > 65 Broaden abx as below  Trend pct, lactate  Levophed and vasopressin for BP support  RENAL  AKI  Chronic hyponatremia  Hypophosphatemia  Hypokalemia   P:   BMET now  Replace electrolytes as indicated KVO IVF Maintain BP/HR Replete lytes PRN   GASTROINTESTINAL A:   Some suggestion of pancreatitis on CT abd - lipase normal  P:   Continue TF as tol  PPI  HEMATOLOGIC  Anemia - hgb continues to trend down.  No obvious s/s bleeding  P:  SQ heparin  F/u CBC  Transfuse for hgb <7  INFECTIOUS  RLL CAP  URINE STREP + ?pancreatitis  Septic shock - remains pressor dependent. WBC treding up.   P:   HIV negative Change rocephin to cefepime and vanc Reculture today given persistent shock  ENDOCRINE A:    At risk hyperglycemia  P:   ICU hyperglycemia protocol  NEUROLOGIC AMS - likely r/t respiratory failure/?hypercarbia and hypotension. Much improved.   P:   RASS goal: 0 Monitor Prn fentanyl   FAMILY  - Updates: updated daughter bedside, relayed that we are concerned about her hemodynamics  - Inter-disciplinary family meet or Palliative Care meeting due by:  Day 7. Current LOS is LOS 4 days  The patient is critically ill with multiple organ systems failure and requires high complexity decision making for assessment and support, frequent evaluation and titration of therapies, application of advanced monitoring technologies and extensive interpretation of multiple databases.   Critical Care Time devoted to patient care services described in this note is  35  Minutes. This time reflects time of care of this signee Dr Jennet Maduro. This critical care time does not reflect procedure time, or teaching time or supervisory time of PA/NP/Med student/Med Resident etc but could involve care discussion time.  Rush Farmer, M.D. [facility] Pulmonary/Critical Care Medicine. Pager: [phone]. After hours pager: [phone].

## 2017-03-16 NOTE — Progress Notes (Signed)
SLP Cancellation Note  Patient Details Name: Kayla Tyler MRN: [id number] DOB: Dec 01, 1945   Cancelled treatment:       Reason Eval/Treat Not Completed: Medical issues which prohibited therapy - she remains intubated. Will f/u post-extubation.   Germain Osgood 03/16/2017, 8:34 AM  Germain Osgood, M.A. CCC-SLP [phone]

## 2017-03-17 ENCOUNTER — Inpatient Hospital Stay (HOSPITAL_COMMUNITY): Payer: Medicare Other

## 2017-03-17 DIAGNOSIS — D638 Anemia in other chronic diseases classified elsewhere: Secondary | ICD-10-CM

## 2017-03-17 DIAGNOSIS — J9601 Acute respiratory failure with hypoxia: Secondary | ICD-10-CM

## 2017-03-17 DIAGNOSIS — R1310 Dysphagia, unspecified: Secondary | ICD-10-CM

## 2017-03-17 DIAGNOSIS — T17908A Unspecified foreign body in respiratory tract, part unspecified causing other injury, initial encounter: Secondary | ICD-10-CM

## 2017-03-17 DIAGNOSIS — D696 Thrombocytopenia, unspecified: Secondary | ICD-10-CM

## 2017-03-17 LAB — CBC WITH DIFFERENTIAL/PLATELET
BASOS ABS: 0 10*3/uL (ref 0.0–0.1)
BASOS PCT: 0 %
EOS PCT: 0 %
Eosinophils Absolute: 0 10*3/uL (ref 0.0–0.7)
HEMATOCRIT: 15 % — AB (ref 36.0–46.0)
Hemoglobin: 5.7 g/dL — CL (ref 12.0–15.0)
LYMPHS ABS: 1.1 10*3/uL (ref 0.7–4.0)
Lymphocytes Relative: 8 %
MCH: 35.8 pg — AB (ref 26.0–34.0)
MCHC: 37 g/dL — AB (ref 30.0–36.0)
MCV: 96.8 fL (ref 78.0–100.0)
MONOS PCT: 13 %
Monocytes Absolute: 1.8 10*3/uL — ABNORMAL HIGH (ref 0.1–1.0)
NEUTROS ABS: 11.1 10*3/uL — AB (ref 1.7–7.7)
Neutrophils Relative %: 79 %
Platelets: 64 10*3/uL — ABNORMAL LOW (ref 150–400)
RBC: 1.55 MIL/uL — ABNORMAL LOW (ref 3.87–5.11)
RDW: 13.5 % (ref 11.5–15.5)
WBC: 14 10*3/uL — ABNORMAL HIGH (ref 4.0–10.5)

## 2017-03-17 LAB — BLOOD GAS, ARTERIAL
ACID-BASE DEFICIT: 4 mmol/L — AB (ref 0.0–2.0)
Bicarbonate: 21.8 mmol/L (ref 20.0–28.0)
DRAWN BY: 418751
FIO2: 30
LHR: 16 {breaths}/min
O2 SAT: 97.8 %
PCO2 ART: 48.7 mmHg — AB (ref 32.0–48.0)
PEEP/CPAP: 5 cmH2O
PO2 ART: 100 mmHg (ref 83.0–108.0)
Patient temperature: 98.6
VT: 400 mL
pH, Arterial: 7.273 — ABNORMAL LOW (ref 7.350–7.450)

## 2017-03-17 LAB — CBC
HCT: 14.9 % — ABNORMAL LOW (ref 36.0–46.0)
HEMATOCRIT: 26.9 % — AB (ref 36.0–46.0)
HEMOGLOBIN: 5.4 g/dL — AB (ref 12.0–15.0)
HEMOGLOBIN: 9.3 g/dL — AB (ref 12.0–15.0)
MCH: 35.3 pg — AB (ref 26.0–34.0)
MCH: 32 pg (ref 26.0–34.0)
MCHC: 36.2 g/dL — AB (ref 30.0–36.0)
MCHC: 34.6 g/dL (ref 30.0–36.0)
MCV: 97.4 fL (ref 78.0–100.0)
MCV: 92.4 fL (ref 78.0–100.0)
Platelets: 65 10*3/uL — ABNORMAL LOW (ref 150–400)
Platelets: 62 10*3/uL — ABNORMAL LOW (ref 150–400)
RBC: 1.53 MIL/uL — AB (ref 3.87–5.11)
RBC: 2.91 MIL/uL — AB (ref 3.87–5.11)
RDW: 13.6 % (ref 11.5–15.5)
RDW: 17.1 % — AB (ref 11.5–15.5)
WBC: 11.9 10*3/uL — ABNORMAL HIGH (ref 4.0–10.5)
WBC: 11.3 10*3/uL — AB (ref 4.0–10.5)

## 2017-03-17 LAB — IRON AND TIBC
IRON: 40 ug/dL (ref 28–170)
Saturation Ratios: 32 % — ABNORMAL HIGH (ref 10.4–31.8)
TIBC: 123 ug/dL — AB (ref 250–450)
UIBC: 83 ug/dL

## 2017-03-17 LAB — GLUCOSE, CAPILLARY
Glucose-Capillary: 124 mg/dL — ABNORMAL HIGH (ref 65–99)
Glucose-Capillary: 109 mg/dL — ABNORMAL HIGH (ref 65–99)
Glucose-Capillary: 127 mg/dL — ABNORMAL HIGH (ref 65–99)
Glucose-Capillary: 70 mg/dL (ref 65–99)
Glucose-Capillary: 51 mg/dL — ABNORMAL LOW (ref 65–99)
Glucose-Capillary: 66 mg/dL (ref 65–99)

## 2017-03-17 LAB — FERRITIN: FERRITIN: 89 ng/mL (ref 11–307)

## 2017-03-17 LAB — HEPATIC FUNCTION PANEL
ALBUMIN: 3.3 g/dL — AB (ref 3.5–5.0)
ALK PHOS: 109 U/L (ref 38–126)
ALT: 55 U/L — ABNORMAL HIGH (ref 14–54)
AST: 66 U/L — ABNORMAL HIGH (ref 15–41)
BILIRUBIN DIRECT: 0.4 mg/dL (ref 0.1–0.5)
BILIRUBIN INDIRECT: 0.5 mg/dL (ref 0.3–0.9)
BILIRUBIN TOTAL: 0.9 mg/dL (ref 0.3–1.2)
Total Protein: 5.4 g/dL — ABNORMAL LOW (ref 6.5–8.1)

## 2017-03-17 LAB — BASIC METABOLIC PANEL
ANION GAP: 9 (ref 5–15)
BUN: 26 mg/dL — ABNORMAL HIGH (ref 6–20)
CALCIUM: 6.9 mg/dL — AB (ref 8.9–10.3)
CHLORIDE: 90 mmol/L — AB (ref 101–111)
CO2: 22 mmol/L (ref 22–32)
CREATININE: 1.12 mg/dL — AB (ref 0.44–1.00)
GFR calc Af Amer: 55 mL/min — ABNORMAL LOW (ref >60)
GFR, EST NON AFRICAN AMERICAN: 48 mL/min — AB (ref >60)
GLUCOSE: 121 mg/dL — AB (ref 65–99)
POTASSIUM: 3.2 mmol/L — AB (ref 3.5–5.1)
Sodium: 121 mmol/L — ABNORMAL LOW (ref 135–145)

## 2017-03-17 LAB — LACTATE DEHYDROGENASE: LDH: 264 U/L — ABNORMAL HIGH (ref 98–192)

## 2017-03-17 LAB — PHOSPHORUS: Phosphorus: 3.9 mg/dL (ref 2.5–4.6)

## 2017-03-17 LAB — CULTURE, BLOOD (ROUTINE X 2)
CULTURE: NO GROWTH
SPECIAL REQUESTS: ADEQUATE

## 2017-03-17 LAB — PROCALCITONIN: Procalcitonin: 1.94 ng/mL

## 2017-03-17 LAB — PREPARE RBC (CROSSMATCH)

## 2017-03-17 LAB — AMMONIA: Ammonia: 85 umol/L — ABNORMAL HIGH (ref 9–35)

## 2017-03-17 LAB — URINE CULTURE: Culture: 20000 — AB

## 2017-03-17 LAB — MAGNESIUM: Magnesium: 1.9 mg/dL (ref 1.7–2.4)

## 2017-03-17 MED ORDER — POTASSIUM CHLORIDE 20 MEQ/15ML (10%) PO SOLN
40.0000 meq | Freq: Once | ORAL | Status: AC
  Administered 2017-03-17: 40 meq
  Filled 2017-03-17: qty 30

## 2017-03-17 MED ORDER — SODIUM CHLORIDE 0.9 % IV SOLN: 0.0000 ug/min | INTRAVENOUS | Status: DC

## 2017-03-17 MED ORDER — MIDAZOLAM HCL 2 MG/2ML IJ SOLN
2.0000 mg | Freq: Once | INTRAMUSCULAR | Status: AC
  Administered 2017-03-17: 2 mg via INTRAVENOUS

## 2017-03-17 MED ORDER — DEXTROSE 10 % IV SOLN
INTRAVENOUS | Status: DC
  Administered 2017-03-17: 23:00:00 via INTRAVENOUS

## 2017-03-17 MED ORDER — POTASSIUM CHLORIDE 20 MEQ/15ML (10%) PO SOLN
30.0000 meq | ORAL | Status: AC
  Administered 2017-03-17 (×2): 30 meq
  Filled 2017-03-17 (×2): qty 30

## 2017-03-17 MED ORDER — FENTANYL CITRATE (PF) 100 MCG/2ML IJ SOLN
100.0000 ug | Freq: Once | INTRAMUSCULAR | Status: AC
  Administered 2017-03-17: 100 ug via INTRAVENOUS

## 2017-03-17 MED ORDER — MAGNESIUM SULFATE 2 GM/50ML IV SOLN
2.0000 g | Freq: Once | INTRAVENOUS | Status: AC
  Administered 2017-03-17: 2 g via INTRAVENOUS
  Filled 2017-03-17: qty 50

## 2017-03-17 MED ORDER — DEXTROSE 50 % IV SOLN
INTRAVENOUS | Status: AC
  Administered 2017-03-17 (×2): 25 mL
  Filled 2017-03-17: qty 50

## 2017-03-17 MED ORDER — SODIUM CHLORIDE 0.9 % IV SOLN
0.0000 ug/min | INTRAVENOUS | Status: DC
  Administered 2017-03-17: 200 ug/min via INTRAVENOUS
  Administered 2017-03-17: 250 ug/min via INTRAVENOUS
  Administered 2017-03-18: 100 ug/min via INTRAVENOUS
  Filled 2017-03-17 (×2): qty 4
  Filled 2017-03-17: qty 40
  Filled 2017-03-17: qty 4

## 2017-03-17 MED ORDER — SODIUM CHLORIDE 0.9 % IV SOLN
Freq: Once | INTRAVENOUS | Status: AC
  Administered 2017-03-17: 12:00:00 via INTRAVENOUS

## 2017-03-17 NOTE — Progress Notes (Signed)
Called by bedside RN, patient self extubated.  Is clearly not protecting her airway on exam and is desaturating .  Decision made to intubate.  While bagging patient spit out a molar.  Patient was sedated and intubated.  During intubation vomited a liter of TF and aspirated again. Patient was then intubated, suctioned and OGT placed.  The patient is critically ill with multiple organ systems failure and requires high complexity decision making for assessment and support, frequent evaluation and titration of therapies, application of advanced monitoring technologies and extensive interpretation of multiple databases.   Critical Care Time devoted to patient care services described in this note is  12  Minutes. This time reflects time of care of this signee Dr Jennet Maduro. This critical care time does not reflect procedure time, or teaching time or supervisory time of PA/NP/Med student/Med Resident etc but could involve care discussion time.  Rush Farmer, M.D. [facility] Pulmonary/Critical Care Medicine. Pager: [phone]. After hours pager: [phone].

## 2017-03-17 NOTE — Procedures (Signed)
Intubation Procedure Note Kayla Tyler [id number] 08-03-1945  Procedure: Intubation Indications: Airway protection and maintenance  Procedure Details Consent: Unable to obtain consent because of emergent medical necessity. Time Out: Verified patient identification, verified procedure, site/side was marked, verified correct patient position, special equipment/implants available, medications/allergies/relevent history reviewed, required imaging and test results available.  Performed  Maximum sterile technique was used including antiseptics, gloves, hand hygiene and mask.  MAC  Patient coughed up a molar prior to intubation.  Evaluation Hemodynamic Status: BP stable throughout; O2 sats: stable throughout Patient's Current Condition: stable Complications: No apparent complications Patient did tolerate procedure well. Chest X-ray ordered to verify placement.  CXR: pending.   YACOUB,WESAM 03/17/2017

## 2017-03-17 NOTE — Procedures (Signed)
OGT Placed By MD  OGT placed under direct laryngoscopy and verified by auscultation and TF coming from tube.  Rush Farmer, M.D. [facility] Pulmonary/Critical Care Medicine. Pager: [phone]. After hours pager: [phone].

## 2017-03-17 NOTE — Progress Notes (Signed)
[facility] ADULT ICU REPLACEMENT PROTOCOL FOR AM LAB REPLACEMENT ONLY  The patient does apply for the [facility] Adult ICU Electrolyte Replacment Protocol based on the criteria listed below:   1. Is GFR >/= 40 ml/min? Yes.    Patient's GFR today is 48 2. Is urine output >/= 0.5 ml/kg/hr for the last 6 hours? Yes.   Patient's UOP is 1.77 ml/kg/hr 3. Is BUN < 60 mg/dL? Yes.    Patient's BUN today is 26 4. Abnormal electrolyte K 3.2 5. Ordered repletion with: per protocol 6. If a panic level lab has been reported, has the CCM MD in charge been notified? Yes.  .   Physician:  [place] 03/17/2017 4:59 AM

## 2017-03-17 NOTE — Progress Notes (Signed)
[place] Progress Note Patient Name: Kayla Tyler DOB: December 16, 1945 MRN: [id number]   Date of Service  03/17/2017  HPI/Events of Note  Patient currently with ongoing hypoglycemia despite bolus dextrose. Currently in septic shock from pneumonia. Accu-Cheks every 4 hours scheduled.   eICU Interventions  1. Starting D10 at 50 mL per hour 2. Continuing Accu-Cheks every 4 hours with M.D. notification parameters      Intervention Category Major Interventions: Other:  Tera Partridge 03/17/2017, 10:31 PM

## 2017-03-17 NOTE — Progress Notes (Signed)
Hypoglycemic Event  CBG: 51  Treatment: dextrose 56mL  Symptoms: None  Follow-up CBG: Time: 9:41 PM CBG Result: 66  Follow-up CBG: Time: 10:31 PM   CBG Result: 61  Possible Reasons for Event: Vomiting and Inadequate meal intake  Comments/MD notified: MD made aware; D10 gtt initiated    Mardi Mainland

## 2017-03-17 NOTE — Progress Notes (Signed)
Patient found to be breath stacking and visibly uncomfortable even after sedation. Attempted multiple different modes/settings. Patient now on PCV 35, rate 16, +5, 60% with patient achieving volumes from 480-500. Pt comfortable with vital signs within normal limits. RRT will continue to monitor.

## 2017-03-17 NOTE — Progress Notes (Signed)
PULMONARY / CRITICAL CARE MEDICINE   Name: Kayla Tyler MRN: [id number] DOB: 1946-04-18 PCP Tawny Asal, MD LOS 5 as of 03/17/2017     ADMISSION DATE:  03/09/2017 CONSULTATION DATE:  03/17/2017   REFERRING MD:  ER  CHIEF COMPLAINT:  Encephalopathy, vdrf, sepsis  BRIEF 71 yo-F presented to the ED 10/26 because of altered mental status. Patient is poor historian, so the hx was taken from the records and her daughter. Patient had decreased oral intake for the lasy few days, however she was alert and interactive. But on the day of admission was confused, EMS was called and found the patient to be hypothermic, bradycardiac and hypotensive. Patient intubated in the ED for airway protection.   EVENT 03/13/2017 - intubated. On levophed 55mcg, vaso 0.03 and neo at 19mcg. 60% fio2 on =vent. Daughter at bedside - reports daily EtOH and cig. Admits to COPD hx. Reports that she annually gets flu/pna this time of the year 03/14/17 - BCID staph species detected - coag neg, MRSA PCR negative. URINE STREP ++ , RVP NEGATIVe.  Lowering pressor needs. Off neo. AKI worse but RN says making urine   SUBJECTIVE/OVERNIGHT/INTERVAL HX No change overnight.   VITAL SIGNS: BP 102/68   Pulse 98   Temp 97.7 F (36.5 C)   Resp 19   Ht 5' (1.524 m)   Wt 124 lb 1.9 oz (56.3 kg)   SpO2 99%   BMI 24.24 kg/m   HEMODYNAMICS: CVP:  [7 mmHg-43 mmHg] 12 mmHg  VENTILATOR SETTINGS: Vent Mode: PRVC FiO2 (%):  [30 %-40 %] 30 % Set Rate:  [16 bmp] 16 bmp Vt Set:  [400 mL] 400 mL PEEP:  [5 cmH20] 5 cmH20 Pressure Support:  [10 cmH20] 10 cmH20 Plateau Pressure:  [11 cmH20-23 cmH20] 11 cmH20  INTAKE / OUTPUT: I/O last 3 completed shifts: In: 4335.8 [I.V.:2430.8; NG/GT:1605; IV [id number]:629] Out: 2485 [Urine:2185; Stool:300]   EXAM  General:  Thin, frail, chronically ill appearing female, NAD on vent  HEENT: MM pink/moist Neuro: awake, alert, follows all commands, nods appropriately, [place], gen weakness   CV: s1s2 rrr, no m/r/g PULM: even/non-labored, lungs bilaterally diminished, few scattered rhonchi R>L  BM:WUXL, non-tender, bsx4 active  Extremities: warm/dry, no edema  Skin: no rashes or lesions  LABS  PULMONARY  Recent Labs Lab 03/13/17 0359 03/13/17 0624 03/13/17 1121 03/13/17 1353 03/14/17 1230 03/17/17 0423  PHART 7.112* 7.318* 7.387 7.358 7.283* 7.273*  PCO2ART 70.1* 49.6* 30.4* 36.4 42.8 48.7*  PO2ART 69.0* 57.0* 69.0* 67.0* 76.0* 100  HCO3 22.8 25.4 18.3* 20.4 20.2 21.8  TCO2 25 27 19* 21* 22  --   O2SAT 88.0 86.0 94.0 92.0 93.0 97.8    CBC  Recent Labs Lab 03/16/17 0437 03/17/17 0348 03/17/17 0620  HGB 7.4* 5.7* 5.4*  HCT 20.4* 15.0* 14.9*  WBC 26.0* 14.0* 11.9*  PLT 93* 64* 65*    COAGULATION  Recent Labs Lab 03/14/17 0545  INR 1.80    CARDIAC    Recent Labs Lab 03/14/17 0545  TROPONINI 0.06*   No results for input(s): PROBNP in the last 168 hours.   CHEMISTRY  Recent Labs Lab 03/13/17 1827 03/14/17 0020 03/14/17 0545 03/15/17 0400 03/15/17 1150 03/16/17 0437 03/17/17 0348  NA 118* 117*  --   --  122* 120* 121*  K 3.4* 3.4*  --   --  3.2* 3.6 3.2*  CL 88* 88*  --   --  91* 92* 90*  CO2 22 20*  --   --  23 21* 22  GLUCOSE 107* 170*  --   --  142* 134* 121*  BUN 14 15  --   --  23* 24* 26*  CREATININE 1.11* 1.10*  --   --  1.16* 1.11* 1.12*  CALCIUM 6.6* 6.5*  --   --  6.3* 6.5* 6.9*  MG  --   --  1.1* 2.0  --  1.7 1.9  PHOS  --   --  1.3* 1.3*  --  2.9 3.9   Estimated Creatinine Clearance: 35.7 mL/min (A) (by C-G formula based on SCr of 1.12 mg/dL (H)).  I/O last 3 completed shifts: In: 4335.8 [I.V.:2430.8; NG/GT:1605; IV [id number]:409] Out: 2485 [Urine:2185; Stool:300]   Intake/Output Summary (Last 24 hours) at 03/17/17 0751 Last data filed at 03/17/17 0600  Gross per 24 hour  Intake          2088.86 ml  Output             2190 ml  Net          -101.14 ml     LIVER  Recent Labs Lab 02/22/2017 1945  03/14/17 0545  AST 198* 323*  ALT 58* 88*  ALKPHOS 244* 131*  BILITOT 1.7* 1.0  PROT 5.1* 4.0*  ALBUMIN 2.0* 1.4*  INR  --  1.80   INFECTIOUS  Recent Labs Lab 03/13/17 1139 03/13/17 1630 03/14/17 0020  03/15/17 1150 03/16/17 0437 03/17/17 0348  LATICACIDVEN 5.0* 3.3* 2.6*  --   --   --   --   PROCALCITON 2.90  --   --   < > 4.11 2.63 1.94  < > = values in this interval not displayed.  ENDOCRINE CBG (last 3)   Recent Labs  03/16/17 1945 03/16/17 2325 03/17/17 0412  GLUCAP 135* 114* 124*   IMAGING x48h  - image(s) personally visualized  -   highlighted in bold Dg Chest Portable 1 View  Result Date: 03/16/2017 CLINICAL DATA:  Community acquired pneumonia EXAM: PORTABLE CHEST 1 VIEW COMPARISON:  03/14/2017 FINDINGS: Endotracheal tube tip is above the carina. There is an enteric tube looped in the stomach. Right IJ catheter tip is in the low right atrium. Normal heart size. Aortic atherosclerosis. Persistent moderate to large right pleural effusion and mild diffuse edema. Airspace consolidation within the right midlung and right base persists. IMPRESSION: 1. No change in aeration to the right midlung and right base compared with previous exam. Electronically Signed   By: Kerby Moors M.D.   On: 03/16/2017 07:46   ABX:  Vanc 10/26>>>10/27>>>10/30>>> Aztreonam 10/26>>>10/27 levquin 10/27>>>10/29 Rocephin 10/29>>> 10/30 Cefepime 10/30>>> Vancomycin 10/30>>>  MICRO:  RVP 10/28>>> NEG  BC x 2 10/26>>> 1/2 coag neg staph Urine strep 10/27>>> POS   LINES/Tubes:  ETT 10/26>>> R IJ CVL 10/26>>>  STUDIES:  CT chest/abd/pelvis 10/27>>> 1. Dense right lower lobe consolidation, and additional patchy airspace opacities within the right upper and middle lobes, compatible with multifocal pneumonia. Would correlate clinically for evidence of aspiration. 2. Small right pleural effusion noted. 3. Vague soft tissue edema about the head and body of the pancreas is concerning  for pancreatitis. This is not well assessed without contrast. Underlying mesenteric edema may reflect the suspected pancreatic process. 4. Distention of the esophagus with fluid, concerning for significant esophageal dysmotility. Enteric tube noted ending at the body of the stomach. 5. 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 Recommend annual imaging followup by CTA or MRA. 6. Diffuse coronary  artery calcifications seen. 7. Scattered diverticulosis along the sigmoid colon, without evidence of diverticulitis. 8. Diffuse aortic atherosclerosis. 9. Apparent 3.7 cm right adnexal dermoid cyst noted, mildly increased in size from 2006. 10. 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 11. Soft tissue edema at the right flank.   ASSESSMENT / PLAN:  PULMONARY A: Acute respiratory failure  Dense RLL PNA -- CAP +/- Aspiration PNA  Hx COPD  P:   Vent support - 8cc/kg  Begin PS trials, no extubation given hemodynamics and mental status.  Worsening hypercapnia, consider increasing RR today Active diureses today to improve respiratory status and oxygenation F/u CXR, demonstrating worsening left sided pulmonary edema F/u ABG, worsening PCO2 with stable acidotic pH PS as tolerated, no extubation given hemodynamics Duonebs continued Hold home spiriva  Will need speech for swallow eval post extubation   CARDIOVASCULAR A:  Shock - presumed septic v hypovolemic with poor PO intake. Has been admitted for poor PO intake in July and treated for hyponatremia at that time as well  P:  Continue to wean pressors as able  Check CVP  MAP goal > 65 Trend pct, lactate, improving  Levophed and vasopressin for BP support  RENAL  A: AKI  Chronic hyponatremia  Hypophosphatemia  Hypokalemia  P:   BMET now  Replacing electrolytes as indicated KVO IVF Maintain BP/HR  GASTROINTESTINAL A:   Some suggestion of pancreatitis on CT abd  - lipase normal  P:   Continue TF as tolerated PPI  HEMATOLOGIC A: Anemia - hgb continues to trend down.  No obvious s/s bleeding  Hgb trended down to 5.4 today, will replace P:  SQ heparin  2 units PRBC's ordered after type and screen Hemolysis evaluation ordered, Hapto, Bili, hepatic, LDH Iron studies ordered F/u CBC following transfusion  Transfuse for hgb <7  INFECTIOUS A: RLL CAP  URINE STREP + ?pancreatitis  Septic shock - remains pressor dependent. WBC trended up initially, down today  P:   HIV negative Changed rocephin to cefepime and vanc with rapid decline in [facility] Recultured 10/30 given persistent shock, results pending  ENDOCRINE A:   At risk hyperglycemia P:   ICU hyperglycemia protocol  NEUROLOGIC A: AMS - likely r/t respiratory failure/?hypercarbia and hypotension. Much improved.  P:   RASS goal: 0 Monitor Prn fentanyl   FAMILY  - Updates: updated daughter bedside, relayed that we are concerned about her hemodynamics  - Inter-disciplinary family meet or Palliative Care meeting due by:  Day 7. Current LOS is LOS 5 days  [name], MD  Attending Note:  71 year old female actively drinking who presents with AMS and respiratory failure.  Was intubated in the ED for pneumonia and respiratory failure.  On exam, sedate this AM with clear lungs.  I reviewed CXR myself, ETT ok.  No family bedside.  Levophed need decreasing.  Will begin weaning trials but no extubation given mental status and hemodynamics.  Transfuse, suspect bone marrow suppression due to etoh and liver failure.  Check ammonia level.   The patient is critically ill with multiple organ systems failure and requires high complexity decision making for assessment and support, frequent evaluation and titration of therapies, application of advanced monitoring technologies and extensive interpretation of multiple databases.   Critical Care Time devoted to patient care services described  in this note is  35  Minutes. This time reflects time of care of this signee Dr Jennet Maduro. This critical care time does not reflect  procedure time, or teaching time or supervisory time of PA/NP/Med student/Med Resident etc but could involve care discussion time.  Rush Farmer, M.D. [facility] Pulmonary/Critical Care Medicine. Pager: [phone]. After hours pager: [phone].

## 2017-03-17 NOTE — Progress Notes (Signed)
SLP Cancellation Note  Patient Details Name: Kayla Tyler MRN: [id number] DOB: 27-Dec-1945   Cancelled treatment:       Reason Eval/Treat Not Completed: Medical issues which prohibited therapy. Pt remains intubated and most recent MD note says no extubation at this time. Will sign off for now - please reorder when ready.   Germain Osgood 03/17/2017, 9:00 AM  Germain Osgood, M.A. CCC-SLP [phone]

## 2017-03-17 NOTE — Progress Notes (Addendum)
CRITICAL VALUE ALERT  Critical Value:  hgb 5.7  Date & Time Notied: 03/17/17 6:09 AM  Provider Notified: Dr. Jimmy Footman  Orders Received/Actions taken: Repeat labs for verification

## 2017-03-17 NOTE — Progress Notes (Signed)
Patient self-extubated. Placed on 100% non-rebreather mask  to  maintain spo2. Dr Nelda Marseille at bedside. Intubated with a #7.5 ETT secured at 24cm from lips. RRT will continue to monitor.

## 2017-03-18 ENCOUNTER — Inpatient Hospital Stay (HOSPITAL_COMMUNITY): Payer: Medicare Other

## 2017-03-18 DIAGNOSIS — R1310 Dysphagia, unspecified: Secondary | ICD-10-CM

## 2017-03-18 LAB — BPAM RBC
BLOOD PRODUCT EXPIRATION DATE: 201811052359
BLOOD PRODUCT EXPIRATION DATE: 201811122359
ISSUE DATE / TIME: 201810311112
ISSUE DATE / TIME: 201810311112
UNIT TYPE AND RH: 600
Unit Type and Rh: 600

## 2017-03-18 LAB — CBC
HCT: 27.7 % — ABNORMAL LOW (ref 36.0–46.0)
HEMOGLOBIN: 10.1 g/dL — AB (ref 12.0–15.0)
MCH: 32.9 pg (ref 26.0–34.0)
MCHC: 36.5 g/dL — ABNORMAL HIGH (ref 30.0–36.0)
MCV: 90.2 fL (ref 78.0–100.0)
Platelets: 74 10*3/uL — ABNORMAL LOW (ref 150–400)
RBC: 3.07 MIL/uL — AB (ref 3.87–5.11)
RDW: 18.2 % — ABNORMAL HIGH (ref 11.5–15.5)
WBC: 23.4 10*3/uL — AB (ref 4.0–10.5)

## 2017-03-18 LAB — TYPE AND SCREEN
ABO/RH(D): A NEG
ANTIBODY SCREEN: NEGATIVE
UNIT DIVISION: 0
UNIT DIVISION: 0

## 2017-03-18 LAB — BASIC METABOLIC PANEL
ANION GAP: 10 (ref 5–15)
BUN: 32 mg/dL — ABNORMAL HIGH (ref 6–20)
CALCIUM: 6.8 mg/dL — AB (ref 8.9–10.3)
CHLORIDE: 94 mmol/L — AB (ref 101–111)
CO2: 17 mmol/L — AB (ref 22–32)
Creatinine, Ser: 1.15 mg/dL — ABNORMAL HIGH (ref 0.44–1.00)
GFR calc non Af Amer: 46 mL/min — ABNORMAL LOW (ref >60)
GFR, EST AFRICAN AMERICAN: 54 mL/min — AB (ref >60)
Glucose, Bld: 120 mg/dL — ABNORMAL HIGH (ref 65–99)
Potassium: 3.4 mmol/L — ABNORMAL LOW (ref 3.5–5.1)
SODIUM: 121 mmol/L — AB (ref 135–145)

## 2017-03-18 LAB — GLUCOSE, CAPILLARY
GLUCOSE-CAPILLARY: 100 mg/dL — AB (ref 65–99)
GLUCOSE-CAPILLARY: 107 mg/dL — AB (ref 65–99)
GLUCOSE-CAPILLARY: 132 mg/dL — AB (ref 65–99)
GLUCOSE-CAPILLARY: 61 mg/dL — AB (ref 65–99)
GLUCOSE-CAPILLARY: 96 mg/dL (ref 65–99)
Glucose-Capillary: 123 mg/dL — ABNORMAL HIGH (ref 65–99)
Glucose-Capillary: 62 mg/dL — ABNORMAL LOW (ref 65–99)
Glucose-Capillary: 96 mg/dL (ref 65–99)

## 2017-03-18 LAB — POCT I-STAT 3, ART BLOOD GAS (G3+)
ACID-BASE DEFICIT: 8 mmol/L — AB (ref 0.0–2.0)
BICARBONATE: 17.9 mmol/L — AB (ref 20.0–28.0)
O2 Saturation: 95 %
PH ART: 7.297 — AB (ref 7.350–7.450)
TCO2: 19 mmol/L — ABNORMAL LOW (ref 22–32)
pCO2 arterial: 36.8 mmHg (ref 32.0–48.0)
pO2, Arterial: 83 mmHg (ref 83.0–108.0)

## 2017-03-18 LAB — HAPTOGLOBIN: Haptoglobin: 143 mg/dL (ref 34–200)

## 2017-03-18 LAB — MAGNESIUM: MAGNESIUM: 1.5 mg/dL — AB (ref 1.7–2.4)

## 2017-03-18 LAB — PHOSPHORUS: Phosphorus: 2.6 mg/dL (ref 2.5–4.6)

## 2017-03-18 MED ORDER — MAGNESIUM SULFATE 2 GM/50ML IV SOLN
2.0000 g | Freq: Once | INTRAVENOUS | Status: AC
  Administered 2017-03-18: 2 g via INTRAVENOUS
  Filled 2017-03-18: qty 50

## 2017-03-18 MED ORDER — POTASSIUM PHOSPHATES 15 MMOLE/5ML IV SOLN
30.0000 mmol | Freq: Once | INTRAVENOUS | Status: AC
  Administered 2017-03-18: 30 mmol via INTRAVENOUS
  Filled 2017-03-18: qty 10

## 2017-03-18 MED ORDER — WHITE PETROLATUM EX OINT
TOPICAL_OINTMENT | CUTANEOUS | Status: AC
  Administered 2017-03-18: 10:00:00
  Filled 2017-03-18: qty 28.35

## 2017-03-18 MED ORDER — FUROSEMIDE 10 MG/ML IJ SOLN
40.0000 mg | Freq: Four times a day (QID) | INTRAMUSCULAR | Status: AC
  Administered 2017-03-18 (×3): 40 mg via INTRAVENOUS
  Filled 2017-03-18 (×3): qty 4

## 2017-03-18 MED ORDER — LACTULOSE 10 GM/15ML PO SOLN
30.0000 g | Freq: Three times a day (TID) | ORAL | Status: DC
  Administered 2017-03-18 – 2017-03-22 (×11): 30 g
  Filled 2017-03-18 (×14): qty 45

## 2017-03-18 NOTE — Progress Notes (Signed)
RT found pt RR set at 14 turned back to 16 per MD vent order

## 2017-03-18 NOTE — Progress Notes (Signed)
Midnight CBG checked, still low at 62. MD notified. Verbal orders to increase D10 rate to 100 mL/hr. Orders carried out. Nursing will continue to monitor.

## 2017-03-18 NOTE — Progress Notes (Signed)
PULMONARY / CRITICAL CARE MEDICINE   Name: Kayla Tyler MRN: [id number] DOB: 1945-06-29 PCP Tawny Asal, MD LOS 6 as of 03/18/2017     ADMISSION DATE:  02/24/2017 CONSULTATION DATE:  03/18/2017   REFERRING MD:  ER  CHIEF COMPLAINT:  Encephalopathy, vdrf, sepsis  BRIEF 71 yo-F presented to the ED 10/26 because of altered mental status. Patient is poor historian, so the hx was taken from the records and her daughter. Patient had decreased oral intake for the lasy few days, however she was alert and interactive. But on the day of admission was confused, EMS was called and found the patient to be hypothermic, bradycardiac and hypotensive. Patient intubated in the ED for airway protection.   EVENT 03/13/2017 - intubated. On levophed 60mcg, vaso 0.03 and neo at 191mcg. 60% fio2 on =vent. Daughter at bedside - reports daily EtOH and cig. Admits to COPD hx. Reports that she annually gets flu/pna this time of the year 03/14/17 - BCID staph species detected - coag neg, MRSA PCR negative. URINE STREP ++ , RVP NEGATIVe.  Lowering pressor needs. Off neo. AKI worse but RN says making urine   SUBJECTIVE/OVERNIGHT/INTERVAL HX Self extubated and reintubated yesterday, massive aspiration.   VITAL SIGNS: BP (!) 122/59   Pulse (!) 103   Temp 98.4 F (36.9 C)   Resp 16   Ht 5' (1.524 m)   Wt 56.4 kg (124 lb 5.4 oz)   SpO2 99%   BMI 24.28 kg/m   HEMODYNAMICS: CVP:  [7 mmHg-15 mmHg] 8 mmHg  VENTILATOR SETTINGS: Vent Mode: PCV FiO2 (%):  [30 %-100 %] 60 % Set Rate:  [16 bmp] 16 bmp Vt Set:  [400 mL] 400 mL PEEP:  [5 cmH20] 5 cmH20 Plateau Pressure:  [22 cmH20-35 cmH20] 35 cmH20  INTAKE / OUTPUT: I/O last 3 completed shifts: In: 4618.8 [I.V.:2618.8; Blood:431.3; Other:220; NG/GT:798.8; IV [id number]:127] Out: 5170 [Urine:2220; Emesis/NG output:300; Stool:1200]  EXAM  General:  Thin, frail, chronically ill appearing female, NAD on vent  HEENT: MM pink/moist Neuro: Awake, alert, follows  all commands, nods appropriately, Jennafer, gen weakness  CV: RRR, Nl S1/S2 and -M/R/G. PULM: Even/non-labored, lungs bilaterally diminished, few scattered rhonchi R>L  GI: Soft, non-tender, bsx4 active  Extremities: Warm/dry, no edema  Skin: No rashes or lesions  LABS  PULMONARY  Recent Labs Lab 03/13/17 0624 03/13/17 1121 03/13/17 1353 03/14/17 1230 03/17/17 0423 03/18/17 0403  PHART 7.318* 7.387 7.358 7.283* 7.273* 7.297*  PCO2ART 49.6* 30.4* 36.4 42.8 48.7* 36.8  PO2ART 57.0* 69.0* 67.0* 76.0* 100 83.0  HCO3 25.4 18.3* 20.4 20.2 21.8 17.9*  TCO2 27 19* 21* 22  --  19*  O2SAT 86.0 94.0 92.0 93.0 97.8 95.0   CBC  Recent Labs Lab 03/17/17 0620 03/17/17 1630 03/18/17 0335  HGB 5.4* 9.3* 10.1*  HCT 14.9* 26.9* 27.7*  WBC 11.9* 11.3* 23.4*  PLT 65* 62* 74*   COAGULATION  Recent Labs Lab 03/14/17 0545  INR 1.80   CARDIAC  Recent Labs Lab 03/14/17 0545  TROPONINI 0.06*   No results for input(s): PROBNP in the last 168 hours.  CHEMISTRY  Recent Labs Lab 03/14/17 0020 03/14/17 0545 03/15/17 0400 03/15/17 1150 03/16/17 0437 03/17/17 0348 03/18/17 0335  NA 117*  --   --  122* 120* 121* 121*  K 3.4*  --   --  3.2* 3.6 3.2* 3.4*  CL 88*  --   --  91* 92* 90* 94*  CO2 20*  --   --  23 21* 22 17*  GLUCOSE 170*  --   --  142* 134* 121* 120*  BUN 15  --   --  23* 24* 26* 32*  CREATININE 1.10*  --   --  1.16* 1.11* 1.12* 1.15*  CALCIUM 6.5*  --   --  6.3* 6.5* 6.9* 6.8*  MG  --  1.1* 2.0  --  1.7 1.9 1.5*  PHOS  --  1.3* 1.3*  --  2.9 3.9 2.6   Estimated Creatinine Clearance: 34.8 mL/min (A) (by C-G formula based on SCr of 1.15 mg/dL (H)).  I/O last 3 completed shifts: In: 4618.8 [I.V.:2618.8; Blood:431.3; Other:220; NG/GT:798.8; IV IEPPIRJJO:841] Out: 6606 [Urine:2220; Emesis/NG output:300; TKZSW:1093]  Intake/Output Summary (Last 24 hours) at 03/18/17 0902 Last data filed at 03/18/17 0800  Gross per 24 hour  Intake          3764.23 ml  Output              2020 ml  Net          1744.23 ml   LIVER  Recent Labs Lab 03/11/2017 1945 03/14/17 0545 03/17/17 0900  AST 198* 323* 66*  ALT 58* 88* 55*  ALKPHOS 244* 131* 109  BILITOT 1.7* 1.0 0.9  PROT 5.1* 4.0* 5.4*  ALBUMIN 2.0* 1.4* 3.3*  INR  --  1.80  --    INFECTIOUS  Recent Labs Lab 03/13/17 1139 03/13/17 1630 03/14/17 0020  03/15/17 1150 03/16/17 0437 03/17/17 0348  LATICACIDVEN 5.0* 3.3* 2.6*  --   --   --   --   PROCALCITON 2.90  --   --   < > 4.11 2.63 1.94  < > = values in this interval not displayed.  ENDOCRINE CBG (last 3)   Recent Labs  03/17/17 2348 03/18/17 0407 03/18/17 0819  GLUCAP 62* 123* 132*   IMAGING x48h  - image(s) personally visualized  -   highlighted in bold Dg Chest Port 1 View  Result Date: 03/18/2017 CLINICAL DATA:  Intubated EXAM: PORTABLE CHEST 1 VIEW COMPARISON:  Chest radiograph from one day prior. FINDINGS: Endotracheal tube tip is 0.7 cm above the carina. Enteric tube terminates in the gastric fundus. Right internal jugular central venous catheter terminates over the right atrium. Stable cardiomediastinal silhouette with normal heart size. No pneumothorax. Small bilateral pleural effusions, decreased on the right and stable on the left. Extensive patchy consolidation throughout both lungs, significantly worsened and most prominent in the upper left lung. Surgical clips IMPRESSION: 1. Low lying endotracheal tube tip, recommend 1 cm retraction . 2. Significant worsening of extensive patchy consolidation throughout both lungs, most prominent in the upper left lung, worrisome for multilobar pneumonia . 3. Small bilateral pleural effusions, decreased on the right . These results were called by telephone at the time of interpretation on 03/18/2017 at 7:43 am to RN HAILEY DADAMO in the ICU, who verbally acknowledged these results. Electronically Signed   By: Ilona Sorrel M.D.   On: 03/18/2017 07:45   Dg Chest Port 1 View  Result Date:  03/17/2017 CLINICAL DATA:  Endotracheal intubation. EXAM: PORTABLE CHEST 1 VIEW COMPARISON:  Portable chest x-ray of March 17, 2017. FINDINGS: The lungs are well-expanded. Layering of pleural fluid on the right persists. There is patchy increased density in the left upper lobe more conspicuous today. The heart is normal in size. The pulmonary vascularity is prominent centrally but not clearly cephalized. There is calcification in the wall of the thoracic aorta. The endotracheal tube  tip lies 9 mm above the carina. The esophagogastric tube tip and proximal port project below the GE junction. The right internal jugular venous catheter tip projects over the right atrium. IMPRESSION: Low positioning of the endotracheal tube by approximately 9 mm above the carina. Withdrawal by 2-3 cm is recommended to avoid accidental mainstem bronchus intubation with patient movement. Stable positioning of the tip of the internal jugular venous catheter in the right atrium. Correlation clinically as to the appropriateness of this positioning is needed. Persistent moderate-sized right pleural effusion layering posteriorly. New patchy interstitial density in the left upper lobe. Thoracic aortic atherosclerosis. Electronically Signed   By: David  Martinique M.D.   On: 03/17/2017 13:24   Dg Chest Port 1 View  Result Date: 03/17/2017 CLINICAL DATA:  Hypoxia EXAM: PORTABLE CHEST 1 VIEW COMPARISON:  March 16, 2017 FINDINGS: Endotracheal tube tip is 3.5 cm above the carina. Nasogastric tube tip and side port are below the diaphragm. Right jugular catheter tip is in the right atrium, stable. No evident pneumothorax. There are bilateral pleural effusions, larger on the right than on the left. There is patchy airspace consolidation in portions of the mid lung and right base regions. There is atelectatic change in the left base. There is mild cardiac enlargement with pulmonary vascularity within normal limits. No adenopathy. There is aortic  atherosclerosis. No evident bone lesions. Surgical clips are noted in the left axilla. IMPRESSION: Tube and catheter positions as described without evident pneumothorax. Persistent pleural effusions, significantly larger on the right than on the left, persist with patchy airspace consolidation in portions of the right middle and lower lobes. Atelectasis left base. Stable cardiac silhouette. There is aortic atherosclerosis. Aortic Atherosclerosis (ICD10-I70.0). Electronically Signed   By: Lowella Grip III M.D.   On: 03/17/2017 07:59   ABX:  Vanc 10/26>>>10/27>>>10/30>>> Aztreonam 10/26>>>10/27 levquin 10/27>>>10/29 Rocephin 10/29>>> 10/30 Cefepime 10/30>>> Vancomycin 10/30>>>  MICRO:  RVP 10/28>>> NEG  BC x 2 10/26>>> 1/2 coag neg staph Urine strep 10/27>>> POS   LINES/Tubes:  ETT 10/26>>>10/31>>>10/31>>> R IJ CVL 10/26>>>  STUDIES:  CT chest/abd/pelvis 10/27>>> 1. Dense right lower lobe consolidation, and additional patchy airspace opacities within the right upper and middle lobes, compatible with multifocal pneumonia. Would correlate clinically for evidence of aspiration. 2. Small right pleural effusion noted. 3. Vague soft tissue edema about the head and body of the pancreas is concerning for pancreatitis. This is not well assessed without contrast. Underlying mesenteric edema may reflect the suspected pancreatic process. 4. Distention of the esophagus with fluid, concerning for significant esophageal dysmotility. Enteric tube noted ending at the body of the stomach. 5. 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 Recommend annual imaging followup by CTA or MRA. 6. Diffuse coronary artery calcifications seen. 7. Scattered diverticulosis along the sigmoid colon, without evidence of diverticulitis. 8. Diffuse aortic atherosclerosis. 9. Apparent 3.7 cm right adnexal dermoid cyst noted, mildly increased in size from 2006. 10. 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 11. Soft tissue edema at the right flank.   ASSESSMENT / PLAN:  PULMONARY A: Acute respiratory failure  Dense RLL PNA -- CAP +/- Aspiration PNA  Hx COPD  P:   Vent support - 8cc/kg  Continue full vent support Increase PEEP to 10 KVO IVF Lasix as below Duonebs continued Hold home spiriva   CARDIOVASCULAR A:  Shock - presumed septic v hypovolemic with poor PO intake. Has been admitted for poor PO intake in  July and treated for hyponatremia at that time as well  P:  Wean pressors as able  CVP checks MAP goal > 65 Levophed and vasopressin for BP support  RENAL A: AKI  Chronic hyponatremia  Hypophosphatemia  Hypokalemia  P:   BMET now  Replacing electrolytes as indicated KVO IVF Maintain BP/HR  GASTROINTESTINAL A:   Some suggestion of pancreatitis on CT abd - lipase normal  P:   Restart TF PPI  HEMATOLOGIC A: Anemia - hgb continues to trend down.  No obvious s/s bleeding  Hgb trended down to 5.4 today, will replace P:  SQ heparin  F/u CBC following transfusion  Transfuse for hgb <7  INFECTIOUS A: RLL CAP  URINE STREP + ?pancreatitis  Septic shock - remains pressor dependent. WBC trended up initially, down today  P:   HIV negative Changed rocephin to cefepime and vanc with rapid decline in [facility] specially after aspiration event yesterday F/U on new cultures  ENDOCRINE A:   At risk hyperglycemia P:   ICU hyperglycemia protocol  NEUROLOGIC A: AMS - likely r/t respiratory failure/?hypercarbia and hypotension. Much improved.  P:   RASS goal: 0 Monitor Prn fentanyl   FAMILY  - Updates: Need family bedside, need to discuss code status  - Inter-disciplinary family meet or Palliative Care meeting due by:  Day 7. Current LOS is LOS 6 days  The patient is critically ill with multiple organ systems failure and requires high complexity decision making for assessment and support, frequent  evaluation and titration of therapies, application of advanced monitoring technologies and extensive interpretation of multiple databases.   Critical Care Time devoted to patient care services described in this note is  35  Minutes. This time reflects time of care of this signee Dr Jennet Maduro. This critical care time does not reflect procedure time, or teaching time or supervisory time of PA/NP/Med student/Med Resident etc but could involve care discussion time.  Rush Farmer, M.D. [facility] Pulmonary/Critical Care Medicine. Pager: [phone]. After hours pager: [phone].

## 2017-03-18 NOTE — Progress Notes (Signed)
Primary lead changed to V lead on pts ECG.

## 2017-03-18 DEATH — deceased

## 2017-03-19 ENCOUNTER — Inpatient Hospital Stay (HOSPITAL_COMMUNITY): Payer: Medicare Other

## 2017-03-19 LAB — GLUCOSE, CAPILLARY
GLUCOSE-CAPILLARY: 141 mg/dL — AB (ref 65–99)
GLUCOSE-CAPILLARY: 125 mg/dL — AB (ref 65–99)
GLUCOSE-CAPILLARY: 103 mg/dL — AB (ref 65–99)
Glucose-Capillary: 128 mg/dL — ABNORMAL HIGH (ref 65–99)
Glucose-Capillary: 133 mg/dL — ABNORMAL HIGH (ref 65–99)
Glucose-Capillary: 86 mg/dL (ref 65–99)

## 2017-03-19 LAB — CBC
HCT: 26.2 % — ABNORMAL LOW (ref 36.0–46.0)
Hemoglobin: 9.6 g/dL — ABNORMAL LOW (ref 12.0–15.0)
MCH: 32.2 pg (ref 26.0–34.0)
MCHC: 36.6 g/dL — AB (ref 30.0–36.0)
MCV: 87.9 fL (ref 78.0–100.0)
PLATELETS: 85 10*3/uL — AB (ref 150–400)
RBC: 2.98 MIL/uL — AB (ref 3.87–5.11)
RDW: 17.1 % — ABNORMAL HIGH (ref 11.5–15.5)
WBC: 29.9 10*3/uL — ABNORMAL HIGH (ref 4.0–10.5)

## 2017-03-19 LAB — BASIC METABOLIC PANEL
ANION GAP: 11 (ref 5–15)
Anion gap: 12 (ref 5–15)
BUN: 32 mg/dL — AB (ref 6–20)
BUN: 31 mg/dL — ABNORMAL HIGH (ref 6–20)
CALCIUM: 7.4 mg/dL — AB (ref 8.9–10.3)
CO2: 20 mmol/L — ABNORMAL LOW (ref 22–32)
CO2: 19 mmol/L — ABNORMAL LOW (ref 22–32)
CREATININE: 0.96 mg/dL (ref 0.44–1.00)
Calcium: 7.5 mg/dL — ABNORMAL LOW (ref 8.9–10.3)
Chloride: 92 mmol/L — ABNORMAL LOW (ref 101–111)
Chloride: 94 mmol/L — ABNORMAL LOW (ref 101–111)
Creatinine, Ser: 0.92 mg/dL (ref 0.44–1.00)
GFR, EST NON AFRICAN AMERICAN: 58 mL/min — AB (ref >60)
Glucose, Bld: 144 mg/dL — ABNORMAL HIGH (ref 65–99)
Glucose, Bld: 155 mg/dL — ABNORMAL HIGH (ref 65–99)
Potassium: 2.3 mmol/L — CL (ref 3.5–5.1)
Potassium: 2.9 mmol/L — ABNORMAL LOW (ref 3.5–5.1)
SODIUM: 124 mmol/L — AB (ref 135–145)
SODIUM: 124 mmol/L — AB (ref 135–145)

## 2017-03-19 LAB — MAGNESIUM: Magnesium: 1.7 mg/dL (ref 1.7–2.4)

## 2017-03-19 LAB — BLOOD GAS, ARTERIAL
Acid-base deficit: 2.9 mmol/L — ABNORMAL HIGH (ref 0.0–2.0)
Bicarbonate: 20.4 mmol/L (ref 20.0–28.0)
DRAWN BY: 414221
FIO2: 40
O2 Saturation: 98.4 %
PEEP: 10 cmH2O
Patient temperature: 98.6
Pressure control: 35 cmH2O
RATE: 16 resp/min
pCO2 arterial: 29.3 mmHg — ABNORMAL LOW (ref 32.0–48.0)
pH, Arterial: 7.457 — ABNORMAL HIGH (ref 7.350–7.450)
pO2, Arterial: 117 mmHg — ABNORMAL HIGH (ref 83.0–108.0)

## 2017-03-19 LAB — CULTURE, RESPIRATORY

## 2017-03-19 LAB — CULTURE, RESPIRATORY W GRAM STAIN

## 2017-03-19 LAB — PROTIME-INR
INR: 1.35
Prothrombin Time: 16.5 seconds — ABNORMAL HIGH (ref 11.4–15.2)

## 2017-03-19 LAB — PHOSPHORUS: PHOSPHORUS: 3.7 mg/dL (ref 2.5–4.6)

## 2017-03-19 MED ORDER — VECURONIUM BROMIDE 10 MG IV SOLR
10.0000 mg | Freq: Once | INTRAVENOUS | Status: AC
  Administered 2017-03-19: 10 mg via INTRAVENOUS
  Filled 2017-03-19: qty 10

## 2017-03-19 MED ORDER — MIDAZOLAM HCL 2 MG/2ML IJ SOLN
4.0000 mg | Freq: Once | INTRAMUSCULAR | Status: AC
  Administered 2017-03-19: 2 mg via INTRAVENOUS
  Filled 2017-03-19: qty 4

## 2017-03-19 MED ORDER — POTASSIUM CHLORIDE 20 MEQ/15ML (10%) PO SOLN
40.0000 meq | ORAL | Status: AC
  Administered 2017-03-19 (×3): 40 meq
  Filled 2017-03-19 (×3): qty 30

## 2017-03-19 MED ORDER — ETOMIDATE 2 MG/ML IV SOLN
40.0000 mg | Freq: Once | INTRAVENOUS | Status: AC
  Administered 2017-03-19: 10 mg via INTRAVENOUS
  Filled 2017-03-19: qty 20

## 2017-03-19 MED ORDER — PROPOFOL 500 MG/50ML IV EMUL
5.0000 ug/kg/min | Freq: Once | INTRAVENOUS | Status: DC
  Filled 2017-03-19: qty 50

## 2017-03-19 MED ORDER — FENTANYL CITRATE (PF) 100 MCG/2ML IJ SOLN
200.0000 ug | Freq: Once | INTRAMUSCULAR | Status: AC
  Administered 2017-03-19: 200 ug via INTRAVENOUS
  Filled 2017-03-19: qty 4

## 2017-03-19 MED ORDER — POTASSIUM CHLORIDE 10 MEQ/50ML IV SOLN
10.0000 meq | INTRAVENOUS | Status: AC
  Administered 2017-03-19 (×4): 10 meq via INTRAVENOUS
  Filled 2017-03-19 (×4): qty 50

## 2017-03-19 MED ORDER — MAGNESIUM SULFATE 2 GM/50ML IV SOLN
2.0000 g | Freq: Once | INTRAVENOUS | Status: AC
  Administered 2017-03-19: 2 g via INTRAVENOUS
  Filled 2017-03-19: qty 50

## 2017-03-19 NOTE — Progress Notes (Signed)
NG to advance 5cm per MD. Done. Tube feeds restarted per order.

## 2017-03-19 NOTE — Progress Notes (Signed)
Very small purple area noted on the sacrum. Wound nurse called and will reassess

## 2017-03-19 NOTE — Procedures (Signed)
Bronchoscopy  for Percutaneous  Tracheostomy  Name: Kayla Tyler MRN: [id number] DOB: Jun 24, 1945 Procedure: Bronchoscopy for Percutaneous Tracheostomy Indications: Diagnostic evaluation of the airways for percutaneous tracheostomy.  In conjunction with: Dr. Nelda Marseille  Procedure Details Consent: Risks of procedure as well as the alternatives and risks of each were explained to the (patient/caregiver).  Consent for procedure obtained. Time Out: Verified patient identification, verified procedure, site/side was marked, verified correct patient position, special equipment/implants available, medications/allergies/relevent history reviewed, required imaging and test results available.  Performed  In preparation for procedure, patient was given 100% FiO2 and bronchoscope lubricated. Sedation: Benzodiazepines, Muscle relaxants and Etomidate  Airway entered and the trachea to carina was examined and instrument visualized during tracheostomy.   Procedures performed: Endotracheal Tube retracted in 2 cm increments. Cannulation of airway observed. Dilation observed. Placement of trachel tube  observed . No overt complications. Bronchoscope removed.    Evaluation Hemodynamic Status: BP stable throughout; O2 sats: stable throughout Patient's Current Condition: stable Specimens:  None Complications: No apparent complications Patient did tolerate procedure well.   Richardson Landry Minor ACNP Maryanna Shape PCCM Pager [phone] till 3 pm If no answer page [phone] 03/19/2017, 11:09 AM

## 2017-03-19 NOTE — Progress Notes (Signed)
[place] Progress Note Patient Name: Kayla Tyler DOB: 08/20/1945 MRN: [id number]   Date of Service  03/19/2017  HPI/Events of Note  Low k  supp iv and oral and re check  eICU Interventions       Intervention Category Major Interventions: Electrolyte abnormality - evaluation and management  Raylene Miyamoto. 03/19/2017, 5:14 AM

## 2017-03-19 NOTE — Procedures (Signed)
Bedside Tracheostomy Insertion Procedure Note   Patient Details:   Name: Kayla Tyler DOB: January 30, 1946 MRN: [id number]  Procedure: Tracheostomy  Pre Procedure Assessment: ET Tube Size:7.5 ET Tube secured at lip (cm):22 Bite block in place: No Breath Sounds: Clear  Post Procedure Assessment: BP (!) 129/50   Pulse 94   Temp 99.1 F (37.3 C)   Resp 17   Ht 5' (1.524 m)   Wt 116 lb 13.5 oz (53 kg)   SpO2 100%   BMI 22.82 kg/m  O2 sats: stable throughout Complications: No apparent complications Patient did tolerate procedure well Tracheostomy Brand:Shiley Tracheostomy Style:Cuffed Tracheostomy Size: 6.0 Tracheostomy Secured AUQ:[id number] Tracheostomy Placement Confirmation:Trach cuff visualized and in place and Chest X ray ordered for placement    Phillis Knack [facility] 03/19/2017, 10:50 AM

## 2017-03-19 NOTE — Progress Notes (Signed)
CRITICAL VALUE ALERT  Critical Value:  K 2.3  Date & Time Notied:  03/19/17 5:12 AM  Provider Notified: Dr. Titus Mould  Orders Received/Actions taken: see MAR

## 2017-03-19 NOTE — Progress Notes (Signed)
PULMONARY / CRITICAL CARE MEDICINE   Name: Kayla Tyler MRN: [id number] DOB: 15-Sep-1945 PCP Tawny Asal, MD LOS 7 as of 03/19/2017     ADMISSION DATE:  03/06/2017 CONSULTATION DATE:  03/19/2017   REFERRING MD:  ER  CHIEF COMPLAINT:  Encephalopathy, vdrf, sepsis  BRIEF 71 yo-F presented to the ED 10/26 because of altered mental status. Patient is poor historian, so the hx was taken from the records and her daughter. Patient had decreased oral intake for the lasy few days, however she was alert and interactive. But on the day of admission was confused, EMS was called and found the patient to be hypothermic, bradycardiac and hypotensive. Patient intubated in the ED for airway protection.   EVENT 03/13/2017 - intubated. On levophed 62mcg, vaso 0.03 and neo at 151mcg. 60% fio2 on =vent. Daughter at bedside - reports daily EtOH and cig. Admits to COPD hx. Reports that she annually gets flu/pna this time of the year 03/14/17 - BCID staph species detected - coag neg, MRSA PCR negative. URINE STREP ++ , RVP NEGATIVe.  Lowering pressor needs. Off neo. AKI worse but RN says making urine  Subjective Is scheduled for tracheostomy today.  She is proven refractory to normal voiding parameters.  Per family's request tracheostomy will be pursued.  VITAL SIGNS: BP (!) 129/50   Pulse 94   Temp 99.1 F (37.3 C)   Resp 17   Ht 5' (1.524 m)   Wt 116 lb 13.5 oz (53 kg)   SpO2 100%   BMI 22.82 kg/m   HEMODYNAMICS: CVP:  [5 mmHg-8 mmHg] 8 mmHg  VENTILATOR SETTINGS: Vent Mode: PCV FiO2 (%):  [30 %-60 %] 30 % Set Rate:  [16 bmp] 16 bmp PEEP:  [10 cmH20] 10 cmH20 Plateau Pressure:  [11 cmH20-39 cmH20] 38 cmH20  INTAKE / OUTPUT: I/O last 3 completed shifts: In: 4565.5 [I.V.:3265.5; Other:70; NG/GT:930; IV Piggyback:300] Out: 7893 [YBOFB:5102; Emesis/NG output:600; Stool:1100]  EXAM  General: Ill-appearing female on full mechanical dilatory support.  FiO2 is at 30% HEENT: Endotracheal tube  in place orogastric tube in place no JVD appreciated PSY: Sedated on vent Neuro: Does not follow commands at this time is all extremities spontaneously CV: Heart sounds are regular rate rate and rhythm PULM: Mild rhonchi bilaterally HE:NIDP, non-tender, bsx4 active  Extremities: warm/dry, 2+ edema  Skin: no rashes or lesions   LABS  PULMONARY  Recent Labs Lab 03/13/17 0624 03/13/17 1121 03/13/17 1353 03/14/17 1230 03/17/17 0423 03/18/17 0403 03/19/17 0501  PHART 7.318* 7.387 7.358 7.283* 7.273* 7.297* 7.457*  PCO2ART 49.6* 30.4* 36.4 42.8 48.7* 36.8 29.3*  PO2ART 57.0* 69.0* 67.0* 76.0* 100 83.0 117*  HCO3 25.4 18.3* 20.4 20.2 21.8 17.9* 20.4  TCO2 27 19* 21* 22  --  19*  --   O2SAT 86.0 94.0 92.0 93.0 97.8 95.0 98.4   CBC  Recent Labs Lab 03/17/17 1630 03/18/17 0335 03/19/17 0347  HGB 9.3* 10.1* 9.6*  HCT 26.9* 27.7* 26.2*  WBC 11.3* 23.4* 29.9*  PLT 62* 74* 85*   COAGULATION  Recent Labs Lab 03/14/17 0545 03/19/17 0900  INR 1.80 1.35   CARDIAC  Recent Labs Lab 03/14/17 0545  TROPONINI 0.06*   No results for input(s): PROBNP in the last 168 hours.  CHEMISTRY  Recent Labs Lab 03/15/17 0400 03/15/17 1150 03/16/17 0437 03/17/17 0348 03/18/17 0335 03/19/17 0347  NA  --  122* 120* 121* 121* 124*  K  --  3.2* 3.6 3.2* 3.4* 2.3*  CL  --  91* 92* 90* 94* 92*  CO2  --  23 21* 22 17* 20*  GLUCOSE  --  142* 134* 121* 120* 144*  BUN  --  23* 24* 26* 32* 32*  CREATININE  --  1.16* 1.11* 1.12* 1.15* 0.96  CALCIUM  --  6.3* 6.5* 6.9* 6.8* 7.5*  MG 2.0  --  1.7 1.9 1.5* 1.7  PHOS 1.3*  --  2.9 3.9 2.6 3.7   Estimated Creatinine Clearance: 38 mL/min (by C-G formula based on SCr of 0.96 mg/dL).  I/O last 3 completed shifts: In: 4565.5 [I.V.:3265.5; Other:70; NG/GT:930; IV Piggyback:300] Out: 4431 [VQMGQ:6761; Emesis/NG output:600; Stool:1100]  Intake/Output Summary (Last 24 hours) at 03/19/17 1003 Last data filed at 03/19/17 0900  Gross per 24  hour  Intake          2361.93 ml  Output             6025 ml  Net         -3663.07 ml   LIVER  Recent Labs Lab 03/07/2017 1945 03/14/17 0545 03/17/17 0900 03/19/17 0900  AST 198* 323* 66*  --   ALT 58* 88* 55*  --   ALKPHOS 244* 131* 109  --   BILITOT 1.7* 1.0 0.9  --   PROT 5.1* 4.0* 5.4*  --   ALBUMIN 2.0* 1.4* 3.3*  --   INR  --  1.80  --  1.35   INFECTIOUS  Recent Labs Lab 03/13/17 1139 03/13/17 1630 03/14/17 0020  03/15/17 1150 03/16/17 0437 03/17/17 0348  LATICACIDVEN 5.0* 3.3* 2.6*  --   --   --   --   PROCALCITON 2.90  --   --   < > 4.11 2.63 1.94  < > = values in this interval not displayed.  ENDOCRINE CBG (last 3)   Recent Labs  03/18/17 2356 03/19/17 0358 03/19/17 0731  GLUCAP 107* 141* 125*   IMAGING x48h  - image(s) personally visualized  -   highlighted in bold Dg Chest Port 1 View  Result Date: 03/19/2017 CLINICAL DATA:  Endotracheal tube placement. EXAM: PORTABLE CHEST 1 VIEW COMPARISON:  Radiograph of March 18, 2017. FINDINGS: Endotracheal and nasogastric tubes are in grossly good position. Right internal jugular catheter is unchanged with distal tip in expected position of cavoatrial junction. Atherosclerosis of thoracic aorta is noted. Left upper lobe airspace opacity is mildly improved compared to prior exam. No pneumothorax is noted. Mild bibasilar opacities are noted which are unchanged. Minimal pleural effusions may be present. IMPRESSION: Aortic atherosclerosis. Endotracheal and nasogastric tubes in grossly good position. Stable bibasilar edema or infiltrates are noted with minimal pleural effusions. Mildly improved left upper lobe pneumonia is noted. Electronically Signed   By: Marijo Conception, M.D.   On: 03/19/2017 07:50   Dg Chest Port 1 View  Result Date: 03/18/2017 CLINICAL DATA:  Intubated EXAM: PORTABLE CHEST 1 VIEW COMPARISON:  Chest radiograph from one day prior. FINDINGS: Endotracheal tube tip is 0.7 cm above the carina. Enteric  tube terminates in the gastric fundus. Right internal jugular central venous catheter terminates over the right atrium. Stable cardiomediastinal silhouette with normal heart size. No pneumothorax. Small bilateral pleural effusions, decreased on the right and stable on the left. Extensive patchy consolidation throughout both lungs, significantly worsened and most prominent in the upper left lung. Surgical clips IMPRESSION: 1. Low lying endotracheal tube tip, recommend 1 cm retraction . 2. Significant worsening of extensive patchy consolidation throughout both lungs, most prominent in the  upper left lung, worrisome for multilobar pneumonia . 3. Small bilateral pleural effusions, decreased on the right . These results were called by telephone at the time of interpretation on 03/18/2017 at 7:43 am to RN HAILEY DADAMO in the ICU, who verbally acknowledged these results. Electronically Signed   By: Ilona Sorrel M.D.   On: 03/18/2017 07:45   Dg Chest Port 1 View  Result Date: 03/17/2017 CLINICAL DATA:  Endotracheal intubation. EXAM: PORTABLE CHEST 1 VIEW COMPARISON:  Portable chest x-ray of March 17, 2017. FINDINGS: The lungs are well-expanded. Layering of pleural fluid on the right persists. There is patchy increased density in the left upper lobe more conspicuous today. The heart is normal in size. The pulmonary vascularity is prominent centrally but not clearly cephalized. There is calcification in the wall of the thoracic aorta. The endotracheal tube tip lies 9 mm above the carina. The esophagogastric tube tip and proximal port project below the GE junction. The right internal jugular venous catheter tip projects over the right atrium. IMPRESSION: Low positioning of the endotracheal tube by approximately 9 mm above the carina. Withdrawal by 2-3 cm is recommended to avoid accidental mainstem bronchus intubation with patient movement. Stable positioning of the tip of the internal jugular venous catheter in the  right atrium. Correlation clinically as to the appropriateness of this positioning is needed. Persistent moderate-sized right pleural effusion layering posteriorly. New patchy interstitial density in the left upper lobe. Thoracic aortic atherosclerosis. Electronically Signed   By: David  Martinique M.D.   On: 03/17/2017 13:24   ABX:  Vanc 10/26>>>10/27>>>10/30>>> Aztreonam 10/26>>>10/27 levquin 10/27>>>10/29 Rocephin 10/29>>> 10/30 Cefepime 10/30>>> Vancomycin 10/30>>>  MICRO:  RVP 10/28>>> NEG  BC x 2 10/26>>> 1/2 coag neg staph Urine strep 10/27>>> POS   LINES/Tubes:  ETT 10/26>>>10/31>>>10/31>>> R IJ CVL 10/26>>> Tracheostomy placed on 03/19/2018>>  STUDIES:  CT chest/abd/pelvis 10/27>>> 1. Dense right lower lobe consolidation, and additional patchy airspace opacities within the right upper and middle lobes, compatible with multifocal pneumonia. Would correlate clinically for evidence of aspiration. 2. Small right pleural effusion noted. 3. Vague soft tissue edema about the head and body of the pancreas is concerning for pancreatitis. This is not well assessed without contrast. Underlying mesenteric edema may reflect the suspected pancreatic process. 4. Distention of the esophagus with fluid, concerning for significant esophageal dysmotility. Enteric tube noted ending at the body of the stomach. 5. 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 Recommend annual imaging followup by CTA or MRA. 6. Diffuse coronary artery calcifications seen. 7. Scattered diverticulosis along the sigmoid colon, without evidence of diverticulitis. 8. Diffuse aortic atherosclerosis. 9. Apparent 3.7 cm right adnexal dermoid cyst noted, mildly increased in size from 2006. 10. 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 11. Soft tissue edema at the right flank.   ASSESSMENT / PLAN:  PULMONARY A: Acute respiratory failure  Dense RLL PNA  -- CAP +/- Aspiration PNA  Hx COPD  P:   Vent support - 8cc/kg  Continue full vent support Increase PEEP to 10 KVO IVF Lasix as below Duonebs continued Hold home spiriva  03/19/2018 will place tracheostomy to facilitate liberation from mechanical ventilatory support.  Hopefully she will wean well after tracheostomy.Marland Kitchen  CARDIOVASCULAR A:  Shock - presumed septic v hypovolemic with poor PO intake. Has been admitted for poor PO intake in July and treated for hyponatremia at that time as well  P:  Wean pressors as able  CVP  checks MAP goal > 65 Levophed and vasopressin for BP support  RENAL Lab Results  Component Value Date   CREATININE 0.96 03/19/2017   CREATININE 1.15 (H) 03/18/2017   CREATININE 1.12 (H) 03/17/2017   CREATININE 0.62 08/21/2014    Recent Labs Lab 03/17/17 0348 03/18/17 0335 03/19/17 0347  K 3.2* 3.4* 2.3*     A: AKI  Chronic hyponatremia  Hypophosphatemia  Hypokalemia  P:   BMET now  Replacing electrolytes as indicated magnesium and phosphate are within normal limits.  03/19/2017 we will hold Lasix today in anticipation of tracheostomy and given time for potassium repletion. KVO IVF Maintain BP/HR  GASTROINTESTINAL A:   Some suggestion of pancreatitis on CT abd - lipase normal  P:   Restart TF, on hold for trach 11/2 PPI  HEMATOLOGIC  Recent Labs  03/18/17 0335 03/19/17 0347  HGB 10.1* 9.6*    A: Anemia - hgb continues to trend down.  No obvious s/s bleeding  Hgb trended down to 5.4 today, will replace P:  SQ heparin  F/u CBC following transfusion  Transfuse for hgb <7  INFECTIOUS A: RLL CAP  URINE STREP + ?pancreatitis  Septic shock - remains pressor dependent. WBC trended up initially, down today  P:   HIV negative Changed rocephin to cefepime and vanc with rapid decline in [facility] specially after aspiration event . F/U on new cultures  ENDOCRINE  CBG (last 3)   Recent Labs  03/18/17 2356 03/19/17 0358  03/19/17 0731  GLUCAP 107* 141* 125*      A:   At risk hyperglycemia P:   ICU hyperglycemia protocol  NEUROLOGIC A: AMS - likely r/t respiratory failure/?hypercarbia and hypotension.  P:   RASS goal: 0 Monitor Prn fentanyl   FAMILY  - Updates: Need family bedside, need to discuss code status.  Multiple discussions with family concerning CODE STATUS.  She remains a full code and a tracheostomy is being pursued per family's request.  Tracheostomy is planned for 03/19/2017 per Dr. Nelda Marseille.  - Inter-disciplinary family meet or Palliative Care meeting due by:  Day 7. 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  We were to have a family meeting this AM to discuss plan of care however family called and said they do not wish to have a meeting since they decided they want a trach inspite of my telling them it is futile.  On exam, remains unresponsive and in shock with coarse BS diffusely.  Pressor demand is decreasing.  Diuresed well overnight.  Will proceed with tracheostomy.  Continue with vent and pressor support.  Replace electrolytes.  Labs for AM ordered.  Hold off weaning today.  The patient is critically ill with multiple organ systems failure and requires high complexity decision making for assessment and support, frequent evaluation and titration of therapies, application of advanced monitoring technologies and extensive interpretation of multiple databases.   Critical Care Time devoted to patient care services described in this note is  35  Minutes. This time reflects time of care of this signee Dr Jennet Maduro. This critical care time does not reflect procedure time, or teaching time or supervisory time of PA/NP/Med student/Med Resident etc but could involve care  discussion time.  Rush Farmer, M.D. [facility]  Pulmonary/Critical Care Medicine. Pager: [phone]. After hours pager: [phone].

## 2017-03-19 NOTE — Progress Notes (Signed)
Pt's family called and stated they wanted to "do trach instead of withdrawal". Dr.Yacoob called, consent was taken and verified.  Tracheostomy procedure was done at the bedside. Pt tolerated well. VS stable

## 2017-03-19 NOTE — Progress Notes (Signed)
Nutrition Follow-up  DOCUMENTATION CODES:   Severe malnutrition in context of chronic illness, Underweight  INTERVENTION:   Resume TF via NGT:  Vital AF 1.2 at 45 ml/h (1080 ml per day)  Provides 1296 kcal, 81 gm protein, 876 ml free water daily  NUTRITION DIAGNOSIS:   Severe Malnutrition related to chronic illness (COPD) as evidenced by severe fat depletion, severe muscle depletion.  Ongoing  GOAL:   Patient will meet greater than or equal to 90% of their needs  Met with TF  MONITOR:   Vent status, Skin, Labs, I & O's  ASSESSMENT:   71 yo female with PMH of breast cancer, COPD, HTN, ETOH abuse, tobacco abuse who was admitted on 10/26 with AMS and acute hypoxic respiratory failure requiring intubation.  S/P tracheostomy this morning. NGT is being placed to resume TF. Patient is currently intubated on ventilator support MV: 11 L/min Temp (24hrs), Avg:99.1 F (37.3 C), Min:97.6 F (36.4 C), Max:99.5 F (37.5 C)   Labs and medications reviewed. WOC RN following for multiple areas of skin breakdown. I/O + 16 L since admission, weight up to 116 lbs today.  Diet Order:  Diet NPO time specified Diet NPO time specified  EDUCATION NEEDS:   No education needs have been identified at this time  Skin:  Skin Assessment: Skin Integrity Issues: Skin Integrity Issues:: Unstageable, Other (Comment) Unstageable: Right hip Other: MASD buttocks, trauma injury to R elbow  Last BM:  11/2  Height:   Ht Readings from Last 1 Encounters:  03/13/17 5' (1.524 m)    Weight:   Wt Readings from Last 1 Encounters:  03/19/17 116 lb 13.5 oz (53 kg)    Ideal Body Weight:  45.5 kg  BMI:  Body mass index is 22.82 kg/m.  Estimated Nutritional Needs:   Kcal:  1250  Protein:  70-80 gm  Fluid:  1.5 L    Molli Barrows, RD, LDN, [place] Pager [phone] After Hours Pager [phone]

## 2017-03-19 NOTE — Progress Notes (Signed)
Pharmacy Antibiotic Note - follow up  Kayla Tyler is a 71 y.o. female with a history of COPD admitted on 02/19/2017 with sepsis. Pharmacy consulted to dose Vancomycin and Cefepime. Pt does have a PCN allergy listed, the reaction is passing out. Cliinical worsening despite broad-spectrum antibiotics.  Patient afebrile, WBC trending up 23>>29.9, Scr trending down 1.15>0.96. UOP 1.9  Blood cultures negative.  Plan:  Continue Vancomycin 750 mg IV Q24  Continue Cefepime 1g IV Q24 Monitor clinic progress, WBC, TMax, renal function, electrolytes F/u future de-escalation, & length of therapy   Height: 5' (152.4 cm) Weight: 116 lb 13.5 oz (53 kg) IBW/kg (Calculated) : 45.5  Temp (24hrs), Avg:98.9 F (37.2 C), Min:97.7 F (36.5 C), Max:99.5 F (37.5 C)   Recent Labs Lab 03/11/2017 2313  03/13/17 0340 03/13/17 1139 03/13/17 1630  03/14/17 0020  03/15/17 1150 03/16/17 0437 03/17/17 0348 03/17/17 0620 03/17/17 1630 03/18/17 0335 03/19/17 0347  WBC  --   < >  --   --   --   --   --   < >  --  26.0* 14.0* 11.9* 11.3* 23.4* 29.9*  CREATININE  --   < >  --  0.98  --   < > 1.10*  --  1.16* 1.11* 1.12*  --   --  1.15* 0.96  LATICACIDVEN 2.33*  --  2.1* 5.0* 3.3*  --  2.6*  --   --   --   --   --   --   --   --   < > = values in this interval not displayed.  Estimated Creatinine Clearance: 38 mL/min (by C-G formula based on SCr of 0.96 mg/dL).    Allergies  Allergen Reactions  . Lisinopril Swelling    Lip swelling (reaction to lisinopril/hctz)  . Penicillins Other (See Comments)    Pt passes out. Has patient had a PCN reaction causing immediate rash, facial/tongue/throat swelling, SOB or lightheadedness with hypotension: No Has patient had a PCN reaction causing severe rash involving mucus membranes or skin necrosis: No Has patient had a PCN reaction that required hospitalization yes Has patient had a PCN reaction occurring within the last 10 years: No If all of the above answers  are "NO", then may proceed with Cephalosporin use.    Antimicrobials this admission: Vancomycin 10/26 >> 10/28; 10/30>> Aztreonam 10/26 >> 10/28 Levofloxacin 10/26>>10/29 Ceftriaxone 10/29 >>10/30 Cefepime 10/30>>  Microbiology results: 10/26 BCx: 1/2 Coag neg staph 10/26 UCx: negative 10/29 BCx: ngtd, final pending 10/30 UCx: 20,000 cfu/mL yeasts, catheterized  Nida Boatman, PharmD PGY1 Acute Care Pharmacy Resident Pager: [phone] 03/19/2017 2:28 PM  I discussed / reviewed the pharmacy note by Dr. Haydee Salter and I agree with the resident's findings and plans as documented.  Sloan Leiter, PharmD, BCPS, BCCCP Clinical Pharmacist Clinical phone 03/19/2017 until 3:30PM [phone] After hours, please call [phone]

## 2017-03-19 NOTE — Consult Note (Addendum)
WOC re-consult: Refer to initial [place] note from 10/27.  Pt has multiple patchy areas of  partial and full thickness skin loss related to moisture associated skin breakdown. Topical treatment orders have already been provided during previous consult. Requested to assess sacrum.  There are linear dark-purple lines in a patchy area, approx 4X4cm.  Appearance is consistent with deep tissue injuries which were present on admission but are manifesting several days later; probably from laying on an object with folds for a prolonged period of time prior to admission.  Continue present plan of care with foam dressing and pt is on an air mattress to reduce pressure. No family present to discuss plan of care.   Please re-consult if further assistance is needed.  Thank-you,  Julien Girt MSN, [place], [place], [place], [place]

## 2017-03-19 NOTE — Care Management Note (Signed)
Case Management Note  Patient Details  Name: Kayla Tyler MRN: [id number] Date of Birth: [birth year]  Subjective/Objective:   PT admitted with encephalopathy and sepsis  - required intubation.                 Action/Plan:  PTA from home.  [place] discussions were had - family decided to move forward with trach.  CSW will discuss placement options with family as [place] snf - attending feels aggressive therapy is futile.  CM will continue to follow for discharge needs   Expected Discharge Date:                  Expected Discharge Plan:     In-House Referral:     Discharge planning Services  CM Consult  Post Acute Care Choice:    Choice offered to:     DME Arranged:    DME Agency:     HH Arranged:    HH Agency:     Status of Service:     If discussed at [employer], dates discussed:    Additional Comments:  Maryclare Labrador, RN 03/19/2017, 4:19 PM

## 2017-03-19 NOTE — Procedures (Signed)
Percutaneous Tracheostomy Placement  Consent from family.  Patient sedated, paralyzed and position.  Placed on 100% FiO2 and RR matched.  Area cleaned and draped.  Lidocaine/epi injected.  Skin incision done followed by blunt dissection.  Trachea palpated then punctured, catheter passed and visualized bronchoscopically.  Wire placed and visualized.  Catheter removed.  Airway then entered and dilated.  Size 6 cuffed shiley trach placed and visualized bronchoscopically well above carina.  Good volume returns.  Patient tolerated the procedure well without complications.  Minimal blood loss.  CXR ordered and pending.  Jullia Mulligan G. Monterius Rolf, M.D. [place] Pulmonary/Critical Care Medicine. Pager: [phone]. After hours pager: [phone].  

## 2017-03-19 NOTE — Progress Notes (Signed)
NG tube was placed. Xray ordered. Pt tolerated well. No signs of distress.  Radiology called. NG tube was stationed in the [place] lobe. NG tube was removed immidiately. MD called and notified. Another attempt was done to place NG tube. Xrays ordered stat. Waiting on results

## 2017-03-20 ENCOUNTER — Inpatient Hospital Stay (HOSPITAL_COMMUNITY): Payer: Medicare Other

## 2017-03-20 LAB — BASIC METABOLIC PANEL
ANION GAP: 12 (ref 5–15)
Anion gap: 9 (ref 5–15)
BUN: 29 mg/dL — ABNORMAL HIGH (ref 6–20)
BUN: 27 mg/dL — AB (ref 6–20)
CALCIUM: 7.5 mg/dL — AB (ref 8.9–10.3)
CALCIUM: 7 mg/dL — AB (ref 8.9–10.3)
CO2: 21 mmol/L — ABNORMAL LOW (ref 22–32)
CO2: 22 mmol/L (ref 22–32)
CREATININE: 0.81 mg/dL (ref 0.44–1.00)
Chloride: 94 mmol/L — ABNORMAL LOW (ref 101–111)
Chloride: 92 mmol/L — ABNORMAL LOW (ref 101–111)
Creatinine, Ser: 0.75 mg/dL (ref 0.44–1.00)
GFR calc Af Amer: >60 mL/min (ref >60)
GLUCOSE: 138 mg/dL — AB (ref 65–99)
GLUCOSE: 126 mg/dL — AB (ref 65–99)
Potassium: 2.9 mmol/L — ABNORMAL LOW (ref 3.5–5.1)
Potassium: 4.3 mmol/L (ref 3.5–5.1)
Sodium: 127 mmol/L — ABNORMAL LOW (ref 135–145)
Sodium: 123 mmol/L — ABNORMAL LOW (ref 135–145)

## 2017-03-20 LAB — GLUCOSE, CAPILLARY
GLUCOSE-CAPILLARY: 141 mg/dL — AB (ref 65–99)
GLUCOSE-CAPILLARY: 160 mg/dL — AB (ref 65–99)
GLUCOSE-CAPILLARY: 126 mg/dL — AB (ref 65–99)
Glucose-Capillary: 60 mg/dL — ABNORMAL LOW (ref 65–99)
Glucose-Capillary: 121 mg/dL — ABNORMAL HIGH (ref 65–99)
Glucose-Capillary: 132 mg/dL — ABNORMAL HIGH (ref 65–99)

## 2017-03-20 LAB — CBC
HCT: 24.4 % — ABNORMAL LOW (ref 36.0–46.0)
Hemoglobin: 8.9 g/dL — ABNORMAL LOW (ref 12.0–15.0)
MCH: 32 pg (ref 26.0–34.0)
MCHC: 36.5 g/dL — AB (ref 30.0–36.0)
MCV: 87.8 fL (ref 78.0–100.0)
PLATELETS: 105 10*3/uL — AB (ref 150–400)
RBC: 2.78 MIL/uL — ABNORMAL LOW (ref 3.87–5.11)
RDW: 16.3 % — AB (ref 11.5–15.5)
WBC: 30.3 10*3/uL — AB (ref 4.0–10.5)

## 2017-03-20 LAB — PHOSPHORUS: PHOSPHORUS: 2.6 mg/dL (ref 2.5–4.6)

## 2017-03-20 LAB — MAGNESIUM: Magnesium: 1.8 mg/dL (ref 1.7–2.4)

## 2017-03-20 MED ORDER — POTASSIUM CHLORIDE 20 MEQ/15ML (10%) PO SOLN
40.0000 meq | ORAL | Status: AC
  Administered 2017-03-20 (×2): 40 meq
  Filled 2017-03-20 (×2): qty 30

## 2017-03-20 MED ORDER — DEXTROSE 50 % IV SOLN
25.0000 mL | Freq: Once | INTRAVENOUS | Status: AC
  Administered 2017-03-20: 25 mL via INTRAVENOUS

## 2017-03-20 MED ORDER — MAGNESIUM SULFATE 2 GM/50ML IV SOLN
2.0000 g | Freq: Once | INTRAVENOUS | Status: AC
  Administered 2017-03-20: 2 g via INTRAVENOUS
  Filled 2017-03-20: qty 50

## 2017-03-20 MED ORDER — POTASSIUM PHOSPHATES 15 MMOLE/5ML IV SOLN
30.0000 mmol | Freq: Once | INTRAVENOUS | Status: AC
  Administered 2017-03-20: 30 mmol via INTRAVENOUS
  Filled 2017-03-20: qty 10

## 2017-03-20 MED ORDER — DEXTROSE 50 % IV SOLN
INTRAVENOUS | Status: AC
  Filled 2017-03-20: qty 50

## 2017-03-20 MED ORDER — SODIUM CHLORIDE 0.9 % IV SOLN: INTRAVENOUS | Status: DC

## 2017-03-20 NOTE — Progress Notes (Signed)
[facility] ADULT ICU REPLACEMENT PROTOCOL FOR AM LAB REPLACEMENT ONLY  The patient does apply for the [facility] Adult ICU Electrolyte Replacment Protocol based on the criteria listed below:   1. Is GFR >/= 40 ml/min? Yes.    Patient's GFR today is >60 2. Is urine output >/= 0.5 ml/kg/hr for the last 6 hours? Yes.   Patient's UOP is 1.26ml/kg/hr 3. Is BUN < 60 mg/dL? Yes.    Patient's BUN today is 29 4. Abnormal electrolyte(s): 2.9 5. Ordered repletion with: per protocol 6. If a panic level lab has been reported, has the CCM MD in charge been notified? Yes.  .   Physician:  Dr. Tera Partridge  Rima Blizzard, [place]is [place] 03/20/2017 6:07 AM

## 2017-03-20 NOTE — Progress Notes (Signed)
PULMONARY / CRITICAL CARE MEDICINE   Name: Kayla Tyler MRN: [id number] DOB: [birth year] PCP Tawny Asal, MD LOS 8 as of 03/20/2017  ADMISSION DATE:  03/08/2017 CONSULTATION DATE:  03/20/2017  REFERRING MD:  ER  CHIEF COMPLAINT:  Encephalopathy, vdrf, sepsis  BRIEF 71 yo-F presented to the ED 10/26 because of altered mental status. Patient is poor historian, so the hx was taken from the records and her daughter. Patient had decreased oral intake for the lasy few days, however she was alert and interactive. But on the day of admission was confused, EMS was called and found the patient to be hypothermic, bradycardiac and hypotensive. Patient intubated in the ED for airway protection.   EVENT 03/13/2017 - intubated. On levophed 68mcg, vaso 0.03 and neo at 150mcg. 60% fio2 on =vent. Daughter at bedside - reports daily EtOH and cig. Admits to COPD hx. Reports that she annually gets flu/pna this time of the year 03/14/17 - BCID staph species detected - coag neg, MRSA PCR negative. URINE STREP ++ , RVP NEGATIVe.  Lowering pressor needs. Off neo. AKI worse but RN says making urine   Subjective No events overnight, more arousable this AM  VITAL SIGNS: BP (!) 100/45   Pulse (!) 109   Temp 98.4 F (36.9 C)   Resp (!) 24   Ht 5' (1.524 m)   Wt 55.3 kg (121 lb 14.6 oz)   SpO2 100%   BMI 23.81 kg/m   HEMODYNAMICS: CVP:  [7 mmHg-45 mmHg] 8 mmHg  VENTILATOR SETTINGS: Vent Mode: PCV FiO2 (%):  [30 %-40 %] 40 % Set Rate:  [16 bmp] 16 bmp PEEP:  [5 cmH20-10 cmH20] 5 cmH20 Pressure Support:  [5 cmH20] 5 cmH20 Plateau Pressure:  [19 cmH20-36 cmH20] 19 cmH20  INTAKE / OUTPUT: I/O last 3 completed shifts: In: 2711.7 [I.V.:1126; NG/GT:1335.8; IV Piggyback:250] Out: 7902 [Urine:4400; IOXBD:5329]  EXAM  General: Ill-appearing female on full mechanical dilatory support.  FiO2 is at 30% HEENT: Trach in place, [place]/AT, PERRL, EOM-I and MMM PSY: Awake and interactive Neuro: Following commands  this AM CV: Heart sounds are regular rate rate and rhythm PULM: Coarse BS diffusely GI: Soft, NT, ND and +BS Extremities: warm/dry, 2+ edema  Skin: no rashes or lesions  LABS  PULMONARY  Recent Labs Lab 03/13/17 1353 03/14/17 1230 03/17/17 0423 03/18/17 0403 03/19/17 0501  PHART 7.358 7.283* 7.273* 7.297* 7.457*  PCO2ART 36.4 42.8 48.7* 36.8 29.3*  PO2ART 67.0* 76.0* 100 83.0 117*  HCO3 20.4 20.2 21.8 17.9* 20.4  TCO2 21* 22  --  19*  --   O2SAT 92.0 93.0 97.8 95.0 98.4   CBC  Recent Labs Lab 03/18/17 0335 03/19/17 0347 03/20/17 0500  HGB 10.1* 9.6* 8.9*  HCT 27.7* 26.2* 24.4*  WBC 23.4* 29.9* 30.3*  PLT 74* 85* 105*   COAGULATION  Recent Labs Lab 03/14/17 0545 03/19/17 0900  INR 1.80 1.35   CARDIAC  Recent Labs Lab 03/14/17 0545  TROPONINI 0.06*   No results for input(s): PROBNP in the last 168 hours.  CHEMISTRY  Recent Labs Lab 03/16/17 0437 03/17/17 0348 03/18/17 0335 03/19/17 0347 03/19/17 1157 03/20/17 0500  NA 120* 121* 121* 124* 124* 127*  K 3.6 3.2* 3.4* 2.3* 2.9* 2.9*  CL 92* 90* 94* 92* 94* 94*  CO2 21* 22 17* 20* 19* 21*  GLUCOSE 134* 121* 120* 144* 155* 138*  BUN 24* 26* 32* 32* 31* 29*  CREATININE 1.11* 1.12* 1.15* 0.96 0.92 0.81  CALCIUM 6.5* 6.9*  6.8* 7.5* 7.4* 7.5*  MG 1.7 1.9 1.5* 1.7  --  1.8  PHOS 2.9 3.9 2.6 3.7  --  2.6   Estimated Creatinine Clearance: 49 mL/min (by C-G formula based on SCr of 0.81 mg/dL).  I/O last 3 completed shifts: In: 2711.7 [I.V.:1126; NG/GT:1335.8; IV Piggyback:250] Out: 1096 [Urine:4400; EAVWU:9811]  Intake/Output Summary (Last 24 hours) at 03/20/17 1210 Last data filed at 03/20/17 1200  Gross per 24 hour  Intake          1624.97 ml  Output             1850 ml  Net          -225.03 ml   LIVER  Recent Labs Lab 03/14/17 0545 03/17/17 0900 03/19/17 0900  AST 323* 66*  --   ALT 88* 55*  --   ALKPHOS 131* 109  --   BILITOT 1.0 0.9  --   PROT 4.0* 5.4*  --   ALBUMIN 1.4* 3.3*   --   INR 1.80  --  1.35   INFECTIOUS  Recent Labs Lab 03/13/17 1630 03/14/17 0020  03/15/17 1150 03/16/17 0437 03/17/17 0348  LATICACIDVEN 3.3* 2.6*  --   --   --   --   PROCALCITON  --   --   < > 4.11 2.63 1.94  < > = values in this interval not displayed.  ENDOCRINE CBG (last 3)   Recent Labs  03/20/17 0342 03/20/17 0412 03/20/17 0825  GLUCAP 60* 141* 121*   IMAGING x48h  - image(s) personally visualized  -   highlighted in bold Dg Chest Port 1 View  Result Date: 03/20/2017 CLINICAL DATA:  Respiratory failure EXAM: PORTABLE CHEST 1 VIEW COMPARISON:  March 19, 2017 FINDINGS: The support apparatus is stable within visualize limits. No pneumothorax. Focal infiltrate in the left apex is stable. Focal infiltrate in the right base is stable. Mild infiltrate in the right upper lobe has improved. Interstitial prominence without overt edema. The cardiomediastinal silhouette is stable. IMPRESSION: 1. Patchy bilateral infiltrate is similar in the left upper lobe and right lower lobe but improved in the right upper lobe. Recommend follow-up to complete resolution. No other change. Electronically Signed   By: Dorise Bullion III M.D   On: 03/20/2017 08:02   Dg Chest Port 1 View  Result Date: 03/19/2017 CLINICAL DATA:  Tracheostomy tube placement. EXAM: PORTABLE CHEST 1 VIEW COMPARISON:  03/19/2017 and prior radiographs FINDINGS: Tracheostomy tube is identified without complicating features. A right IJ central venous catheter with tip overlying the right atrium again noted. An NG tube and endotracheal tube have been removed. Bilateral interstitial and airspace opacities are again noted and not significantly changed. There is no evidence of pneumothorax or pleural effusion. IMPRESSION: Tracheostomy tube placement without complicating features. Unchanged bilateral interstitial and airspace opacities. Endotracheal and NG tubes removed. Electronically Signed   By: Margarette Canada M.D.   On: 03/19/2017  11:42   Dg Chest Port 1 View  Result Date: 03/19/2017 CLINICAL DATA:  Endotracheal tube placement. EXAM: PORTABLE CHEST 1 VIEW COMPARISON:  Radiograph of March 18, 2017. FINDINGS: Endotracheal and nasogastric tubes are in grossly good position. Right internal jugular catheter is unchanged with distal tip in expected position of cavoatrial junction. Atherosclerosis of thoracic aorta is noted. Left upper lobe airspace opacity is mildly improved compared to prior exam. No pneumothorax is noted. Mild bibasilar opacities are noted which are unchanged. Minimal pleural effusions may be present. IMPRESSION: Aortic atherosclerosis. Endotracheal and  nasogastric tubes in grossly good position. Stable bibasilar edema or infiltrates are noted with minimal pleural effusions. Mildly improved left upper lobe pneumonia is noted. Electronically Signed   By: Marijo Conception, M.D.   On: 03/19/2017 07:50   Dg Abd Portable 1v  Result Date: 03/19/2017 CLINICAL DATA:  NG tube placement. EXAM: PORTABLE ABDOMEN - 1 VIEW COMPARISON:  03/19/2017 FINDINGS: An NG tube is identified with side hole overlying the distal esophagus and tip overlying the proximal stomach. Advancement recommended. Nonspecific nonobstructive bowel gas pattern again identified. IMPRESSION: NG tube with side hole overlying the distal esophagus and tip overlying the proximal stomach-recommend advancement. Electronically Signed   By: Margarette Canada M.D.   On: 03/19/2017 15:12   Dg Abd Portable 1v  Result Date: 03/19/2017 CLINICAL DATA:  Nasogastric tube placement. EXAM: PORTABLE ABDOMEN - 1 VIEW COMPARISON:  CT, 03/13/2017 FINDINGS: Nasogastric tube tip projects in the right lower lobe extending through the right mainstem bronchus. IMPRESSION: Malposition nasogastric tube within the airway. Tip projects in a right lower lobe bronchus. Critical Value/emergent results were called by telephone at the time of interpretation on 03/19/2017 at 2:17 pm to Nurse Darden Amber, who verbally acknowledged these results. Electronically Signed   By: Lajean Manes M.D.   On: 03/19/2017 14:14   ABX:  Vanc 10/26>>>10/27>>>10/30>>> Aztreonam 10/26>>>10/27 levquin 10/27>>>10/29 Rocephin 10/29>>> 10/30 Cefepime 10/30>>> Vancomycin 10/30>>>  MICRO:  RVP 10/28>>> NEG  BC x 2 10/26>>> 1/2 coag neg staph Urine strep 10/27>>> POS   LINES/Tubes:  ETT 10/26>>>10/31>>>10/31>>> R IJ CVL 10/26>>> Tracheostomy placed on 03/19/2018>>  STUDIES:  CT chest/abd/pelvis 10/27>>> 1. Dense right lower lobe consolidation, and additional patchy airspace opacities within the right upper and middle lobes, compatible with multifocal pneumonia. Would correlate clinically for evidence of aspiration. 2. Small right pleural effusion noted. 3. Vague soft tissue edema about the head and body of the pancreas is concerning for pancreatitis. This is not well assessed without contrast. Underlying mesenteric edema may reflect the suspected pancreatic process. 4. Distention of the esophagus with fluid, concerning for significant esophageal dysmotility. Enteric tube noted ending at the body of the stomach. 5. 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 Recommend annual imaging followup by CTA or MRA. 6. Diffuse coronary artery calcifications seen. 7. Scattered diverticulosis along the sigmoid colon, without evidence of diverticulitis. 8. Diffuse aortic atherosclerosis. 9. Apparent 3.7 cm right adnexal dermoid cyst noted, mildly increased in size from 2006. 10. 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 11. Soft tissue edema at the right flank.   ASSESSMENT / PLAN:  PULMONARY A: Acute respiratory failure  Dense RLL PNA -- CAP +/- Aspiration PNA  Hx COPD  P:   Begin PS trials as able KVO IVF Hold lasix today Continue duonebs Hold home spiriva  Titrate O2 for sat of 88-92%  CARDIOVASCULAR A:  Shock - presumed  septic v hypovolemic with poor PO intake. Has been admitted for poor PO intake in July and treated for hyponatremia at that time as well  P:  Wean pressors as able  CVP checks as ordered MAP goal > 65 Levophed (down to 12 mcg/min) and vasopressin for BP support  RENAL Lab Results  Component Value Date   CREATININE 0.81 03/20/2017   CREATININE 0.92 03/19/2017   CREATININE 0.96 03/19/2017   CREATININE 0.62 08/21/2014   Recent Labs Lab 03/19/17 0347 03/19/17 1157 03/20/17 0500  K 2.3* 2.9* 2.9*  A: AKI  Chronic hyponatremia  Hypophosphatemia  Hypokalemia  P:   BMET now  Replacing electrolytes as indicated magnesium and phosphate are within normal limits.  03/19/2017 we will hold Lasix today in anticipation of tracheostomy and given time for potassium repletion. KVO IVF Maintain BP/HR  GASTROINTESTINAL A:   Some suggestion of pancreatitis on CT abd - lipase normal  P:   Restart TF, on hold for trach 11/2 PPI  HEMATOLOGIC  Recent Labs  03/19/17 0347 03/20/17 0500  HGB 9.6* 8.9*    A: Anemia - hgb continues to trend down.  No obvious s/s bleeding  Hgb trended down to 5.4 today, will replace P:  SQ heparin  F/u CBC following transfusion  Transfuse for hgb <7  INFECTIOUS A: RLL CAP  URINE STREP + ?pancreatitis  Septic shock - remains pressor dependent. WBC trended up initially, down today  P:   HIV negative Changed rocephin to cefepime and vanc with rapid decline in [facility] specially after aspiration event . F/U on new cultures  ENDOCRINE  CBG (last 3)   Recent Labs  03/20/17 0342 03/20/17 0412 03/20/17 0825  GLUCAP 60* 141* 121*  A:   At risk hyperglycemia P:   ICU hyperglycemia protocol  NEUROLOGIC A: AMS - likely r/t respiratory failure/?hypercarbia and hypotension.  P:   RASS goal: 0 Monitor Prn fentanyl   FAMILY  - Updates: No family bedside  - Inter-disciplinary family meet or Palliative Care meeting due by:  Day 7. Current LOS  is LOS 8 days  The patient is critically ill with multiple organ systems failure and requires high complexity decision making for assessment and support, frequent evaluation and titration of therapies, application of advanced monitoring technologies and extensive interpretation of multiple databases.   Critical Care Time devoted to patient care services described in this note is  35  Minutes. This time reflects time of care of this signee Dr Jennet Maduro. This critical care time does not reflect procedure time, or teaching time or supervisory time of PA/NP/Med student/Med Resident etc but could involve care discussion time.  Rush Farmer, M.D. [facility] Pulmonary/Critical Care Medicine. Pager: [phone]. After hours pager: [phone].

## 2017-03-21 ENCOUNTER — Inpatient Hospital Stay (HOSPITAL_COMMUNITY): Payer: Medicare Other

## 2017-03-21 DIAGNOSIS — Z930 Tracheostomy status: Secondary | ICD-10-CM

## 2017-03-21 LAB — MAGNESIUM: MAGNESIUM: 2 mg/dL (ref 1.7–2.4)

## 2017-03-21 LAB — CBC
HCT: 27.2 % — ABNORMAL LOW (ref 36.0–46.0)
Hemoglobin: 9.3 g/dL — ABNORMAL LOW (ref 12.0–15.0)
MCH: 31.5 pg (ref 26.0–34.0)
MCHC: 34.2 g/dL (ref 30.0–36.0)
MCV: 92.2 fL (ref 78.0–100.0)
PLATELETS: 120 10*3/uL — AB (ref 150–400)
RBC: 2.95 MIL/uL — AB (ref 3.87–5.11)
RDW: 16.5 % — ABNORMAL HIGH (ref 11.5–15.5)
WBC: 28.7 10*3/uL — AB (ref 4.0–10.5)

## 2017-03-21 LAB — CULTURE, BLOOD (ROUTINE X 2)
Culture: NO GROWTH
Culture: NO GROWTH
Special Requests: ADEQUATE
Special Requests: ADEQUATE

## 2017-03-21 LAB — GLUCOSE, CAPILLARY
GLUCOSE-CAPILLARY: 106 mg/dL — AB (ref 65–99)
GLUCOSE-CAPILLARY: 133 mg/dL — AB (ref 65–99)
GLUCOSE-CAPILLARY: 95 mg/dL (ref 65–99)
Glucose-Capillary: 125 mg/dL — ABNORMAL HIGH (ref 65–99)
Glucose-Capillary: 114 mg/dL — ABNORMAL HIGH (ref 65–99)

## 2017-03-21 LAB — BASIC METABOLIC PANEL
ANION GAP: 9 (ref 5–15)
BUN: 25 mg/dL — ABNORMAL HIGH (ref 6–20)
CO2: 24 mmol/L (ref 22–32)
Calcium: 7.6 mg/dL — ABNORMAL LOW (ref 8.9–10.3)
Chloride: 96 mmol/L — ABNORMAL LOW (ref 101–111)
Creatinine, Ser: 0.66 mg/dL (ref 0.44–1.00)
GLUCOSE: 115 mg/dL — AB (ref 65–99)
POTASSIUM: 3.8 mmol/L (ref 3.5–5.1)
SODIUM: 129 mmol/L — AB (ref 135–145)

## 2017-03-21 LAB — PHOSPHORUS: PHOSPHORUS: 4.4 mg/dL (ref 2.5–4.6)

## 2017-03-21 MED ORDER — FUROSEMIDE 10 MG/ML IJ SOLN
20.0000 mg | Freq: Four times a day (QID) | INTRAMUSCULAR | Status: AC
  Administered 2017-03-21 (×3): 20 mg via INTRAVENOUS
  Filled 2017-03-21 (×4): qty 2

## 2017-03-21 MED ORDER — WHITE PETROLATUM EX OINT
TOPICAL_OINTMENT | CUTANEOUS | Status: AC
  Administered 2017-03-22: 07:00:00
  Filled 2017-03-21: qty 28.35

## 2017-03-21 MED ORDER — ONDANSETRON HCL 4 MG/2ML IJ SOLN
4.0000 mg | Freq: Four times a day (QID) | INTRAMUSCULAR | Status: DC | PRN
  Administered 2017-03-21: 4 mg via INTRAVENOUS
  Filled 2017-03-21 (×2): qty 2

## 2017-03-21 NOTE — Progress Notes (Signed)
On reassessment, pt's abdomen was firm and distended.  Pt complained of diffuse abdominal pain.  Tube feeds were stopped, NG tube was hooked up to low wall suction for decompression. Within minutes, over 1 L of undigested tube feed was present in the suction canister and pt reported improvement in pain.  Dr. Nelda Marseille was notified.  Orders received for zofran 4 mg Q6 prn, portable abd xray, and to hold TF.  Xray results called to MD at 1630.

## 2017-03-21 NOTE — Progress Notes (Signed)
Pt placed back on full vent support due to increased WOB.  RN notified. 

## 2017-03-21 NOTE — Progress Notes (Signed)
PULMONARY / CRITICAL CARE MEDICINE   Name: Kayla Tyler MRN: [id number] DOB: 1946-05-05 PCP Tawny Asal, MD LOS 9 as of 03/21/2017  ADMISSION DATE:  03/15/2017 CONSULTATION DATE:  03/21/2017  REFERRING MD:  ER  CHIEF COMPLAINT:  Encephalopathy, vdrf, sepsis  BRIEF 71 yo-F presented to the ED 10/26 because of altered mental status. Patient is poor historian, so the hx was taken from the records and her daughter. Patient had decreased oral intake for the lasy few days, however she was alert and interactive. But on the day of admission was confused, EMS was called and found the patient to be hypothermic, bradycardiac and hypotensive. Patient intubated in the ED for airway protection.   EVENT 03/13/2017 - intubated. On levophed 46mcg, vaso 0.03 and neo at 12mcg. 60% fio2 on =vent. Daughter at bedside - reports daily EtOH and cig. Admits to COPD hx. Reports that she annually gets flu/pna this time of the year 03/14/17 - BCID staph species detected - coag neg, MRSA PCR negative. URINE STREP ++ , RVP NEGATIVe.  Lowering pressor needs. Off neo. AKI worse but RN says making urine   Subjective No events overnight, tolerating PS trials this AM  VITAL SIGNS: BP 93/64   Pulse 92   Temp 98.1 F (36.7 C) (Oral)   Resp (!) 26   Ht 5' (1.524 m)   Wt 53.9 kg (118 lb 13.3 oz)   SpO2 99%   BMI 23.21 kg/m   HEMODYNAMICS: CVP:  [5 mmHg-8 mmHg] 7 mmHg  VENTILATOR SETTINGS: Vent Mode: PSV;CPAP FiO2 (%):  [30 %-40 %] 30 % Set Rate:  [16 bmp] 16 bmp PEEP:  [5 cmH20] 5 cmH20 Pressure Support:  [10 cmH20] 10 cmH20 Plateau Pressure:  [19 cmH20-20 cmH20] 20 cmH20  INTAKE / OUTPUT: I/O last 3 completed shifts: In: 3513.9 [I.V.:1200.9; AJ/OI:7867; IV EHMCNOBSJ:628] Out: 2125 [Urine:1850; Stool:275]  EXAM  General: Ill-appearing female on full mechanical dilatory support.  FiO2 is at 30% HEENT: Trach in place, [place]/AT, PERRL, EOM-I and MMM PSY: Awake and interactive Neuro: Following  commands CV: RRR, Nl S1/S2, -M/R/G PULM: Coarse BS diffusely GI: Soft, NT, ND and +BS Extremities: Warm/dry, 1+ edema  Skin: no rashes or lesions  LABS  PULMONARY Recent Labs  Lab 03/14/17 1230 03/17/17 0423 03/18/17 0403 03/19/17 0501  PHART 7.283* 7.273* 7.297* 7.457*  PCO2ART 42.8 48.7* 36.8 29.3*  PO2ART 76.0* 100 83.0 117*  HCO3 20.2 21.8 17.9* 20.4  TCO2 22  --  19*  --   O2SAT 93.0 97.8 95.0 98.4   CBC Recent Labs  Lab 03/19/17 0347 03/20/17 0500 03/21/17 0323  HGB 9.6* 8.9* 9.3*  HCT 26.2* 24.4* 27.2*  WBC 29.9* 30.3* 28.7*  PLT 85* 105* 120*   COAGULATION Recent Labs  Lab 03/19/17 0900  INR 1.35   CARDIAC No results for input(s): TROPONINI in the last 168 hours. No results for input(s): PROBNP in the last 168 hours.  CHEMISTRY Recent Labs  Lab 03/17/17 0348 03/18/17 0335 03/19/17 0347 03/19/17 1157 03/20/17 0500 03/20/17 2041 03/21/17 0323  NA 121* 121* 124* 124* 127* 123* 129*  K 3.2* 3.4* 2.3* 2.9* 2.9* 4.3 3.8  CL 90* 94* 92* 94* 94* 92* 96*  CO2 22 17* 20* 19* 21* 22 24  GLUCOSE 121* 120* 144* 155* 138* 126* 115*  BUN 26* 32* 32* 31* 29* 27* 25*  CREATININE 1.12* 1.15* 0.96 0.92 0.81 0.75 0.66  CALCIUM 6.9* 6.8* 7.5* 7.4* 7.5* 7.0* 7.6*  MG 1.9 1.5* 1.7  --  1.8  --  2.0  PHOS 3.9 2.6 3.7  --  2.6  --  4.4   Estimated Creatinine Clearance: 45.7 mL/min (by C-G formula based on SCr of 0.66 mg/dL).  I/O last 3 completed shifts: In: 3513.9 [I.V.:1200.9; NW/GN:5621; IV HYQMVHQIO:962] Out: 2125 [Urine:1850; Stool:275]  Intake/Output Summary (Last 24 hours) at 03/21/2017 1050 Last data filed at 03/21/2017 1000 Gross per 24 hour  Intake 2734.8 ml  Output 1200 ml  Net 1534.8 ml   LIVER Recent Labs  Lab 03/17/17 0900 03/19/17 0900  AST 66*  --   ALT 55*  --   ALKPHOS 109  --   BILITOT 0.9  --   PROT 5.4*  --   ALBUMIN 3.3*  --   INR  --  1.35   INFECTIOUS Recent Labs  Lab 03/15/17 1150 03/16/17 0437 03/17/17 0348   PROCALCITON 4.11 2.63 1.94    ENDOCRINE CBG (last 3)  Recent Labs    03/20/17 1944 03/21/17 0021 03/21/17 0814  GLUCAP 126* 133* 106*   IMAGING x48h  - image(s) personally visualized  -   highlighted in bold Dg Chest Port 1 View  Result Date: 03/20/2017 CLINICAL DATA:  Respiratory failure EXAM: PORTABLE CHEST 1 VIEW COMPARISON:  March 19, 2017 FINDINGS: The support apparatus is stable within visualize limits. No pneumothorax. Focal infiltrate in the left apex is stable. Focal infiltrate in the right base is stable. Mild infiltrate in the right upper lobe has improved. Interstitial prominence without overt edema. The cardiomediastinal silhouette is stable. IMPRESSION: 1. Patchy bilateral infiltrate is similar in the left upper lobe and right lower lobe but improved in the right upper lobe. Recommend follow-up to complete resolution. No other change. Electronically Signed   By: Dorise Bullion III M.D   On: 03/20/2017 08:02   Dg Chest Port 1 View  Result Date: 03/19/2017 CLINICAL DATA:  Tracheostomy tube placement. EXAM: PORTABLE CHEST 1 VIEW COMPARISON:  03/19/2017 and prior radiographs FINDINGS: Tracheostomy tube is identified without complicating features. A right IJ central venous catheter with tip overlying the right atrium again noted. An NG tube and endotracheal tube have been removed. Bilateral interstitial and airspace opacities are again noted and not significantly changed. There is no evidence of pneumothorax or pleural effusion. IMPRESSION: Tracheostomy tube placement without complicating features. Unchanged bilateral interstitial and airspace opacities. Endotracheal and NG tubes removed. Electronically Signed   By: Margarette Canada M.D.   On: 03/19/2017 11:42   Dg Abd Portable 1v  Result Date: 03/19/2017 CLINICAL DATA:  NG tube placement. EXAM: PORTABLE ABDOMEN - 1 VIEW COMPARISON:  03/19/2017 FINDINGS: An NG tube is identified with side hole overlying the distal esophagus and tip  overlying the proximal stomach. Advancement recommended. Nonspecific nonobstructive bowel gas pattern again identified. IMPRESSION: NG tube with side hole overlying the distal esophagus and tip overlying the proximal stomach-recommend advancement. Electronically Signed   By: Margarette Canada M.D.   On: 03/19/2017 15:12   Dg Abd Portable 1v  Result Date: 03/19/2017 CLINICAL DATA:  Nasogastric tube placement. EXAM: PORTABLE ABDOMEN - 1 VIEW COMPARISON:  CT, 03/13/2017 FINDINGS: Nasogastric tube tip projects in the right lower lobe extending through the right mainstem bronchus. IMPRESSION: Malposition nasogastric tube within the airway. Tip projects in a right lower lobe bronchus. Critical Value/emergent results were called by telephone at the time of interpretation on 03/19/2017 at 2:17 pm to Nurse Darden Amber, who verbally acknowledged these results. Electronically Signed   By: Dedra Skeens.D.  On: 03/19/2017 14:14   ABX:  Vanc 10/26>>>10/27>>>10/30>>> Aztreonam 10/26>>>10/27 levquin 10/27>>>10/29 Rocephin 10/29>>> 10/30 Cefepime 10/30>>> Vancomycin 10/30>>>  MICRO:  RVP 10/28>>> NEG  BC x 2 10/26>>> 1/2 coag neg staph Urine strep 10/27>>> POS   LINES/Tubes:  ETT 10/26>>>10/31>>>10/31>>> R IJ CVL 10/26>>> Tracheostomy placed on 03/19/2018>>  STUDIES:  CT chest/abd/pelvis 10/27>>> 1. Dense right lower lobe consolidation, and additional patchy airspace opacities within the right upper and middle lobes, compatible with multifocal pneumonia. Would correlate clinically for evidence of aspiration. 2. Small right pleural effusion noted. 3. Vague soft tissue edema about the head and body of the pancreas is concerning for pancreatitis. This is not well assessed without contrast. Underlying mesenteric edema may reflect the suspected pancreatic process. 4. Distention of the esophagus with fluid, concerning for significant esophageal dysmotility. Enteric tube noted ending at the body of the  stomach. 5. 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 Recommend annual imaging followup by CTA or MRA. 6. Diffuse coronary artery calcifications seen. 7. Scattered diverticulosis along the sigmoid colon, without evidence of diverticulitis. 8. Diffuse aortic atherosclerosis. 9. Apparent 3.7 cm right adnexal dermoid cyst noted, mildly increased in size from 2006. 10. 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 11. Soft tissue edema at the right flank.   ASSESSMENT / PLAN:  PULMONARY A: Acute respiratory failure  Dense RLL PNA -- CAP +/- Aspiration PNA  Hx COPD  P:   PS as tolerated, full vent support at night KVO IVF Low dose lasix today Continue duonebs Hold home spiriva  Titrate O2 for sat of 88-92%  CARDIOVASCULAR A:  Shock - presumed septic v hypovolemic with poor PO intake. Has been admitted for poor PO intake in July and treated for hyponatremia at that time as well  P:  Wean pressors as able CVP checks as ordered MAP goal > 65 Levophed (down to 12 mcg/min) and vasopressin for BP support  RENAL Lab Results  Component Value Date   CREATININE 0.66 03/21/2017   CREATININE 0.75 03/20/2017   CREATININE 0.81 03/20/2017   CREATININE 0.62 08/21/2014   Recent Labs  Lab 03/20/17 0500 03/20/17 2041 03/21/17 0323  K 2.9* 4.3 3.8  A: AKI  Chronic hyponatremia  Hypophosphatemia  Hypokalemia  P:   BMET now  Replacing electrolytes as indicated magnesium and phosphate are within normal limits.  03/19/2017 we will hold Lasix today in anticipation of tracheostomy and given time for potassium repletion. KVO IVF Maintain BP/HR Lasix 20 mg IV q6 x3 doses  GASTROINTESTINAL A:   Some suggestion of pancreatitis on CT abd - lipase normal  P:   Continue TF PPI  HEMATOLOGIC Recent Labs    03/20/17 0500 03/21/17 0323  HGB 8.9* 9.3*  A: Anemia - hgb continues to trend down.  No obvious s/s bleeding  Hgb  trended down to 5.4 today, will replace P:  SQ heparin  F/u CBC following transfusion  Transfuse for hgb <7  INFECTIOUS A: RLL CAP  URINE STREP + ?pancreatitis  Septic shock - remains pressor dependent. WBC trended up initially, down today  P:   HIV negative Changed rocephin to cefepime and vanc with rapid decline in [facility] specially after aspiration event . F/U on new cultures  ENDOCRINE  CBG (last 3)  Recent Labs    03/20/17 1944 03/21/17 0021 03/21/17 0814  GLUCAP 126* 133* 106*  A:   At risk hyperglycemia P:   ICU hyperglycemia protocol  NEUROLOGIC  A: AMS - likely r/t respiratory failure/?hypercarbia and hypotension.  P:   RASS goal: 0 Monitor PRN fentanyl  D/C propofol  FAMILY  - Updates: No family bedside  - Inter-disciplinary family meet or Palliative Care meeting due by:  Day 7. Current LOS is LOS 9 days  The patient is critically ill with multiple organ systems failure and requires high complexity decision making for assessment and support, frequent evaluation and titration of therapies, application of advanced monitoring technologies and extensive interpretation of multiple databases.   Critical Care Time devoted to patient care services described in this note is  35  Minutes. This time reflects time of care of this signee Dr Jennet Maduro. This critical care time does not reflect procedure time, or teaching time or supervisory time of PA/NP/Med student/Med Resident etc but could involve care discussion time.  Rush Farmer, M.D. [facility] Pulmonary/Critical Care Medicine. Pager: [phone]. After hours pager: [phone].

## 2017-03-22 ENCOUNTER — Inpatient Hospital Stay (HOSPITAL_COMMUNITY): Payer: Medicare Other

## 2017-03-22 LAB — POCT I-STAT 3, ART BLOOD GAS (G3+)
ACID-BASE DEFICIT: 7 mmol/L — AB (ref 0.0–2.0)
Acid-base deficit: 10 mmol/L — ABNORMAL HIGH (ref 0.0–2.0)
Acid-base deficit: 10 mmol/L — ABNORMAL HIGH (ref 0.0–2.0)
BICARBONATE: 22.5 mmol/L (ref 20.0–28.0)
BICARBONATE: 19.8 mmol/L — AB (ref 20.0–28.0)
Bicarbonate: 22.5 mmol/L (ref 20.0–28.0)
O2 SAT: 99 %
O2 SAT: 95 %
O2 Saturation: 71 %
PCO2 ART: 84.8 mmHg — AB (ref 32.0–48.0)
PCO2 ART: 61.7 mmHg — AB (ref 32.0–48.0)
PH ART: 7.137 — AB (ref 7.350–7.450)
PO2 ART: 55 mmHg — AB (ref 83.0–108.0)
PO2 ART: 179 mmHg — AB (ref 83.0–108.0)
PO2 ART: 102 mmHg (ref 83.0–108.0)
Patient temperature: 98.5
Patient temperature: 98.4
TCO2: 25 mmol/L (ref 22–32)
TCO2: 24 mmol/L (ref 22–32)
TCO2: 22 mmol/L (ref 22–32)
pCO2 arterial: 66.4 mmHg (ref 32.0–48.0)
pH, Arterial: 7.031 — CL (ref 7.350–7.450)
pH, Arterial: 7.113 — CL (ref 7.350–7.450)

## 2017-03-22 LAB — CBC
HCT: 27.1 % — ABNORMAL LOW (ref 36.0–46.0)
Hemoglobin: 9.1 g/dL — ABNORMAL LOW (ref 12.0–15.0)
MCH: 32 pg (ref 26.0–34.0)
MCHC: 33.6 g/dL (ref 30.0–36.0)
MCV: 95.4 fL (ref 78.0–100.0)
PLATELETS: 141 10*3/uL — AB (ref 150–400)
RBC: 2.84 MIL/uL — ABNORMAL LOW (ref 3.87–5.11)
RDW: 16.9 % — ABNORMAL HIGH (ref 11.5–15.5)
WBC: 19.5 10*3/uL — ABNORMAL HIGH (ref 4.0–10.5)

## 2017-03-22 LAB — GLUCOSE, CAPILLARY
GLUCOSE-CAPILLARY: 58 mg/dL — AB (ref 65–99)
GLUCOSE-CAPILLARY: 74 mg/dL (ref 65–99)
Glucose-Capillary: 201 mg/dL — ABNORMAL HIGH (ref 65–99)
Glucose-Capillary: 58 mg/dL — ABNORMAL LOW (ref 65–99)
Glucose-Capillary: 73 mg/dL (ref 65–99)
Glucose-Capillary: 79 mg/dL (ref 65–99)
Glucose-Capillary: 84 mg/dL (ref 65–99)
Glucose-Capillary: 88 mg/dL (ref 65–99)

## 2017-03-22 LAB — BASIC METABOLIC PANEL
Anion gap: 8 (ref 5–15)
BUN: 32 mg/dL — AB (ref 6–20)
CALCIUM: 7.6 mg/dL — AB (ref 8.9–10.3)
CO2: 23 mmol/L (ref 22–32)
CREATININE: 1 mg/dL (ref 0.44–1.00)
Chloride: 95 mmol/L — ABNORMAL LOW (ref 101–111)
GFR calc Af Amer: >60 mL/min (ref >60)
GFR, EST NON AFRICAN AMERICAN: 55 mL/min — AB (ref >60)
Glucose, Bld: 267 mg/dL — ABNORMAL HIGH (ref 65–99)
Potassium: 3.8 mmol/L (ref 3.5–5.1)
SODIUM: 126 mmol/L — AB (ref 135–145)

## 2017-03-22 LAB — C DIFFICILE QUICK SCREEN W PCR REFLEX
C DIFFICILE (CDIFF) TOXIN: NEGATIVE
C DIFFICLE (CDIFF) ANTIGEN: NEGATIVE
C Diff interpretation: NOT DETECTED

## 2017-03-22 LAB — ALBUMIN: Albumin: 1.7 g/dL — ABNORMAL LOW (ref 3.5–5.0)

## 2017-03-22 LAB — LACTIC ACID, PLASMA: Lactic Acid, Venous: 1.6 mmol/L (ref 0.5–1.9)

## 2017-03-22 LAB — MAGNESIUM: Magnesium: 1.9 mg/dL (ref 1.7–2.4)

## 2017-03-22 LAB — PHOSPHORUS: Phosphorus: 4.9 mg/dL — ABNORMAL HIGH (ref 2.5–4.6)

## 2017-03-22 LAB — TRIGLYCERIDES: TRIGLYCERIDES: 64 mg/dL (ref <150)

## 2017-03-22 LAB — CORTISOL: Cortisol, Plasma: 43.5 ug/dL

## 2017-03-22 LAB — PROCALCITONIN: Procalcitonin: 12.01 ng/mL

## 2017-03-22 MED ORDER — MIDAZOLAM HCL 2 MG/2ML IJ SOLN: 1.0000 mg | INTRAMUSCULAR | Status: DC | PRN

## 2017-03-22 MED ORDER — FENTANYL BOLUS VIA INFUSION
25.0000 ug | INTRAVENOUS | Status: DC | PRN
  Filled 2017-03-22: qty 50

## 2017-03-22 MED ORDER — FENTANYL CITRATE (PF) 100 MCG/2ML IJ SOLN: 50.0000 ug | Freq: Once | INTRAMUSCULAR | Status: DC

## 2017-03-22 MED ORDER — DEXTROSE 50 % IV SOLN
INTRAVENOUS | Status: AC
  Administered 2017-03-22: 50 mL
  Filled 2017-03-22: qty 50

## 2017-03-22 MED ORDER — FENTANYL CITRATE (PF) 100 MCG/2ML IJ SOLN: 50.0000 ug | INTRAMUSCULAR | Status: DC | PRN

## 2017-03-22 MED ORDER — METRONIDAZOLE IN NACL 5-0.79 MG/ML-% IV SOLN
500.0000 mg | Freq: Three times a day (TID) | INTRAVENOUS | Status: DC
  Administered 2017-03-22 – 2017-03-23 (×4): 500 mg via INTRAVENOUS
  Filled 2017-03-22 (×6): qty 100

## 2017-03-22 MED ORDER — MAGNESIUM SULFATE 2 GM/50ML IV SOLN
2.0000 g | Freq: Once | INTRAVENOUS | Status: AC
  Administered 2017-03-22: 2 g via INTRAVENOUS
  Filled 2017-03-22: qty 50

## 2017-03-22 MED ORDER — PROPOFOL 1000 MG/100ML IV EMUL
0.0000 ug/kg/min | INTRAVENOUS | Status: DC
  Administered 2017-03-22: 30 ug/kg/min via INTRAVENOUS
  Administered 2017-03-22: 25 ug/kg/min via INTRAVENOUS
  Filled 2017-03-22 (×2): qty 100

## 2017-03-22 MED ORDER — SENNOSIDES 8.8 MG/5ML PO SYRP
5.0000 mL | ORAL_SOLUTION | Freq: Two times a day (BID) | ORAL | Status: DC
  Administered 2017-03-22: 5 mL via ORAL
  Filled 2017-03-22 (×2): qty 5

## 2017-03-22 MED ORDER — FENTANYL CITRATE (PF) 100 MCG/2ML IJ SOLN: 25.0000 ug | INTRAMUSCULAR | Status: DC | PRN

## 2017-03-22 MED ORDER — DOCUSATE SODIUM 50 MG/5ML PO LIQD
100.0000 mg | Freq: Two times a day (BID) | ORAL | Status: DC
  Administered 2017-03-22: 100 mg via ORAL
  Filled 2017-03-22 (×2): qty 10

## 2017-03-22 MED ORDER — IOPAMIDOL (ISOVUE-300) INJECTION 61%
INTRAVENOUS | Status: AC
  Administered 2017-03-22: 23:00:00
  Filled 2017-03-22: qty 30

## 2017-03-22 MED ORDER — METHYLPREDNISOLONE SODIUM SUCC 40 MG IJ SOLR
40.0000 mg | Freq: Two times a day (BID) | INTRAMUSCULAR | Status: DC
  Administered 2017-03-22 – 2017-03-23 (×3): 40 mg via INTRAVENOUS
  Filled 2017-03-22 (×4): qty 1

## 2017-03-22 MED ORDER — FENTANYL 2500MCG IN NS 250ML (10MCG/ML) PREMIX INFUSION
25.0000 ug/h | INTRAVENOUS | Status: DC
  Administered 2017-03-22: 25 ug/h via INTRAVENOUS
  Filled 2017-03-22: qty 250

## 2017-03-22 MED ORDER — FENTANYL BOLUS VIA INFUSION
25.0000 ug | INTRAVENOUS | Status: DC | PRN
  Filled 2017-03-22: qty 25

## 2017-03-22 MED ORDER — HEPARIN SODIUM (PORCINE) 5000 UNIT/ML IJ SOLN
5000.0000 [IU] | Freq: Three times a day (TID) | INTRAMUSCULAR | Status: DC
  Administered 2017-03-22 – 2017-03-23 (×2): 5000 [IU] via SUBCUTANEOUS
  Filled 2017-03-22 (×5): qty 1

## 2017-03-22 MED ORDER — BISACODYL 10 MG RE SUPP: 10.0000 mg | Freq: Every day | RECTAL | Status: DC | PRN

## 2017-03-22 MED ORDER — PANTOPRAZOLE SODIUM 40 MG IV SOLR
40.0000 mg | INTRAVENOUS | Status: DC
  Administered 2017-03-22: 40 mg via INTRAVENOUS
  Filled 2017-03-22 (×2): qty 40

## 2017-03-22 MED ORDER — SODIUM CHLORIDE 0.9 % IV BOLUS (SEPSIS)
1000.0000 mL | Freq: Once | INTRAVENOUS | Status: AC
  Administered 2017-03-22: 1000 mL via INTRAVENOUS

## 2017-03-22 MED ORDER — ALBUMIN HUMAN 25 % IV SOLN
25.0000 g | INTRAVENOUS | Status: AC
  Administered 2017-03-22 (×4): 25 g via INTRAVENOUS
  Filled 2017-03-22 (×3): qty 100

## 2017-03-22 NOTE — Progress Notes (Addendum)
I was called to see patient by NP due to worsening work of breathing. Patient had been on AC/PC prior to my arrival and reportedly had high RR but low tidal volumes in 100-300cc range as well as vent asynchrony. ABG performed revealing severe respiratory acidosis. Changed vent to PRVC with Vt 400, RR 16. Will repeat ABG in 30 minutes. CXR on my review appears unchanged from prior regarding the pulmonary infiltrates. On exam her pressor requirement have increased over the past day, now on Levophed @ 22mc as well as Vasopressin 0.04. CVP checked and was 7. Have stopped the scheduled lasix she had been receiving. Clinically she appears intravascularly dry. Will give 1L NS bolus and start some Albumin IV. Obtain TTE given the shock. She appears uncomfortable shaking head back and forth. Will start fentanyl gtt. Her worsening pressor requirement could be consistent with hypovolemic shock from over-diuresis, or could be due to worsening septic shock. Will check lactate, procalcitonin, cortisol. Has loose stools will check Cdiff. Blood cultures from 10/30 are no growth. Sputum culture from 10/30 grew only candida. Repeat sputum culture is pending. Urine culture grew yeast. Continue Vanc and Cefepime for now.   45 minutes critical care time  Vernie Murders, MD Pulmonary & Critical Care Medicine Pager: [phone]   Update: repeat ABG still acidotic but improving; continue current vent settings and repeat ABG at 10am.

## 2017-03-22 NOTE — Progress Notes (Signed)
PULMONARY / CRITICAL CARE MEDICINE   Name: Kayla Tyler MRN: [id number] DOB: 10-15-1945 PCP Tawny Asal, MD LOS 10 as of 03/22/2017  ADMISSION DATE:  03/11/2017 CONSULTATION DATE:  03/22/2017  REFERRING MD:  ER  CHIEF COMPLAINT:  Encephalopathy, vdrf, sepsis  BRIEF 71 yo-F presented to the ED 10/26 because of altered mental status. Patient is poor historian, so the hx was taken from the records and her daughter. Patient had decreased oral intake for the lasy few days, however she was alert and interactive. But on the day of admission was confused, EMS was called and found the patient to be hypothermic, bradycardiac and hypotensive. Patient intubated in the ED for airway protection.   EVENT 03/13/2017 - intubated. On levophed 40mcg, vaso 0.03 and neo at 155mcg. 60% fio2 on =vent. Daughter at bedside - reports daily EtOH and cig. Admits to COPD hx. Reports that she annually gets flu/pna this time of the year 03/14/17 - BCID staph species detected - coag neg, MRSA PCR negative. URINE STREP ++ , RVP NEGATIVe.  Lowering pressor needs. Off neo. AKI worse but RN says making urine   Subjective Increased work of breathing this a.m., worsening respiratory acidosis chest x-ray without much change Increasing pressor requirements Treated with fluid volume resuscitation, ordered echo. Complaining of abdominal pain  VITAL SIGNS: BP (!) 86/66   Pulse (!) 108   Temp 98.5 F (36.9 C) (Oral)   Resp (!) 21   Ht 5' (1.524 m)   Wt 116 lb 13.5 oz (53 kg)   SpO2 100%   BMI 22.82 kg/m   HEMODYNAMICS: CVP:  [4 mmHg-10 mmHg] 7 mmHg  VENTILATOR SETTINGS: Vent Mode: PRVC FiO2 (%):  [30 %-60 %] 60 % Set Rate:  [16 bmp] 16 bmp Vt Set:  [370 mL-400 mL] 400 mL PEEP:  [5 cmH20] 5 cmH20 Pressure Support:  [10 cmH20] 10 cmH20 Plateau Pressure:  [5 cmH20-19 cmH20] 5 cmH20  INTAKE / OUTPUT: I/O last 3 completed shifts: In: 2925 [I.V.:1707; NG/GT:948; IV Piggyback:270] Out: 1027 [Urine:1155; Emesis/NG  output:310; Stool:225]  EXAM General: This is a chronically ill-appearing 71 year old female who looks much older than her stated age 49: Normocephalic atraumatic, she had does have temporal wasting her sclera are nonicteric she has a #6 tracheostomy without air leak Pulmonary: Diffuse rales, prolonged expiratory wheeze, intermittent air stacking on ventilator Cardiac: Tachycardic, regular rate and rhythm without murmur rub or gallop Abdomen: Soft, hypoactive bowel sounds, no organomegaly Muscular skeletal/extremities: She has diffuse anasarca with 3-4+ pitting edema her skin is weeping Neuro: Awake, moves all extremities, intermittently follows commands, intermittently interactive  LABS  PULMONARY Recent Labs  Lab 03/17/17 0423 03/18/17 0403 03/19/17 0501 03/22/17 0604 03/22/17 0658  PHART 7.273* 7.297* 7.457* 7.031* 7.137*  PCO2ART 48.7* 36.8 29.3* 84.8* 66.4*  PO2ART 100 83.0 117* 55.0* 179.0*  HCO3 21.8 17.9* 20.4 22.5 22.5  TCO2  --  19*  --  25 24  O2SAT 97.8 95.0 98.4 71.0 99.0   CBC Recent Labs  Lab 03/20/17 0500 03/21/17 0323 03/22/17 0432  HGB 8.9* 9.3* 9.1*  HCT 24.4* 27.2* 27.1*  WBC 30.3* 28.7* 19.5*  PLT 105* 120* 141*   COAGULATION Recent Labs  Lab 03/19/17 0900  INR 1.35   CARDIAC No results for input(s): TROPONINI in the last 168 hours. No results for input(s): PROBNP in the last 168 hours.  CHEMISTRY Recent Labs  Lab 03/18/17 0335 03/19/17 0347 03/19/17 1157 03/20/17 0500 03/20/17 2041 03/21/17 0323 03/22/17 0432  NA  121* 124* 124* 127* 123* 129* 126*  K 3.4* 2.3* 2.9* 2.9* 4.3 3.8 3.8  CL 94* 92* 94* 94* 92* 96* 95*  CO2 17* 20* 19* 21* 22 24 23   GLUCOSE 120* 144* 155* 138* 126* 115* 267*  BUN 32* 32* 31* 29* 27* 25* 32*  CREATININE 1.15* 0.96 0.92 0.81 0.75 0.66 1.00  CALCIUM 6.8* 7.5* 7.4* 7.5* 7.0* 7.6* 7.6*  MG 1.5* 1.7  --  1.8  --  2.0 1.9  PHOS 2.6 3.7  --  2.6  --  4.4 4.9*   Estimated Creatinine Clearance: 36.5  mL/min (by C-G formula based on SCr of 1 mg/dL).  I/O last 3 completed shifts: In: 2925 [I.V.:1707; NG/GT:948; IV Piggyback:270] Out: 6301 [Urine:1155; Emesis/NG output:310; Stool:225]  Intake/Output Summary (Last 24 hours) at 03/22/2017 0824 Last data filed at 03/22/2017 0801 Gross per 24 hour  Intake 2642.86 ml  Output 890 ml  Net 1752.86 ml   LIVER Recent Labs  Lab 03/17/17 0900 03/19/17 0900 03/22/17 0432  AST 66*  --   --   ALT 55*  --   --   ALKPHOS 109  --   --   BILITOT 0.9  --   --   PROT 5.4*  --   --   ALBUMIN 3.3*  --  1.7*  INR  --  1.35  --    INFECTIOUS Recent Labs  Lab 03/15/17 1150 03/16/17 0437 03/17/17 0348  PROCALCITON 4.11 2.63 1.94    ENDOCRINE CBG (last 3)  Recent Labs    03/22/17 0025 03/22/17 0424 03/22/17 0450  GLUCAP 88 58* 201*   IMAGING x48h  - image(s) personally visualized  -   highlighted in bold Dg Chest Port 1 View  Result Date: 03/22/2017 CLINICAL DATA:  Respiratory distress.  Hypoxia. EXAM: PORTABLE CHEST 1 VIEW COMPARISON:  03/20/2017 FINDINGS: Tracheostomy. Endotracheal tube tip is off the field of view but below the left hemidiaphragm. Normal heart size and pulmonary vascularity. Patchy perihilar infiltrates similar to previous study. This may represent pneumonia or edema. No blunting of costophrenic angles. No pneumothorax. Calcification and torsion of the aorta. Right central venous catheter with tip over the cavoatrial junction region. IMPRESSION: Patchy perihilar infiltrates in both lungs similar to previous study. This may represent pneumonia or edema. Appliances appear in satisfactory position. Electronically Signed   By: Lucienne Capers M.D.   On: 03/22/2017 05:34   Dg Abd Portable 1v  Result Date: 03/21/2017 CLINICAL DATA:  Initial evaluation for acute abdominal pain. EXAM: PORTABLE ABDOMEN - 1 VIEW COMPARISON:  Prior radiograph from 03/19/2017. FINDINGS: Enteric tube in place with tip in side hole overlying the  stomach, well beyond the GE junction. Tip projects inferiorly. Few scattered gas-filled loops of small bowel seen throughout the abdomen. Prominent heterogeneous structure overlying the lower mid abdomen may reflect a large stool filled sigmoid colon. This is increased in size from previous. Correlation for constipation recommended. No soft tissue mass or abnormal calcification. Cholecystectomy clips noted. Foley catheter overlies the lower mid pelvis. Prominent aorto bi-iliac atherosclerotic disease noted. Streaky atelectatic changes noted at the right lung base. IMPRESSION: 1. Tip and side hole of enteric tube overlying the stomach, well beyond the GE junction. Tip projects inferiorly. 2. Question prominent stool within a dilated sigmoid colon in the lower mid abdomen. Correlation for constipation recommended. Further assessment with dedicated cross-sectional imaging could be performed as warranted. Electronically Signed   By: Jeannine Boga M.D.   On: 03/21/2017 16:20  ABX:  Vanc 10/26>>>10/27>>>10/30>>> Aztreonam 10/26>>>10/27 levquin 10/27>>>10/29 Rocephin 10/29>>> 10/30 Cefepime 10/30>>> Vancomycin 10/30>>>  MICRO:  RVP 10/28>>> NEG  BC x 2 10/26>>> 1/2 coag neg staph Urine strep 10/27>>> POS   LINES/Tubes:  ETT 10/26>>>10/31>>>10/31>>> R IJ CVL 10/26>>> Tracheostomy placed on 03/19/2018>>  STUDIES:  CT chest/abd/pelvis 10/27>>> 1. Dense right lower lobe consolidation, and additional patchy airspace opacities within the right upper and middle lobes, compatible with multifocal pneumonia. Would correlate clinically for evidence of aspiration. 2. Small right pleural effusion noted. 3. Vague soft tissue edema about the head and body of the pancreas is concerning for pancreatitis. This is not well assessed without contrast. Underlying mesenteric edema may reflect the suspected pancreatic process. 4. 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 Enteric tube noted ending at the body of the stomach. 5. 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 Recommend annual imaging followup by CTA or MRA. 6. Diffuse coronary artery calcifications seen. 7. Scattered diverticulosis along the sigmoid colon, without evidence of diverticulitis. 8. Diffuse aortic atherosclerosis. 9. Apparent 3.7 cm right adnexal dermoid cyst noted, mildly increased in size from 2006. 10. 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 11. Soft tissue edema at the right flank. Echo 11/5>>.  ASSESSMENT / PLAN:  PULMONARY A: Acute respiratory failure  Dense RLL PNA -- CAP +/- Aspiration PNA  Hx COPD; now with acute exacerbation-Chest x-ray personally reviewed: Trach tube in satisfactory position right internal jugular vein catheter in satisfactory position.  Left upper lobe and right lower lobe airspace disease about the same.  Perhaps a little clearing on the left upper lobe. -Continue full ventilator support, agree with changing to pressure control -Suspect abdominal issues contributing factor given decreased abdominal compliance, however does have prolonged expiratory wheeze so under lying airflow limitations may also be contributing -has marked air trapping  P:   Continue full ventilator support Scheduled DuoNeb Add systemic steroids for bronchospasm Increase RASS goal to -3 for bronchospasm  Repeat abdominal film, treat ileus if present  CARDIOVASCULAR A:  Initially sepsis, now sepsis versus hypovolemic shock, question cardiogenic component exacerbated by acidosis P:  Discontinue diuretics Keep given volume status Check central venous pressure Discontinue vasopressin Continue to wean norepinephrine for mean arterial pressure greater than 65 Repeat echocardiogram Past  RENAL A: AKI, creatinine normalized Chronic hyponatremia  Hypophosphatemia, now hyperphosphatemia following  correction P:   Keep euvolemic Renal dose medications Follow-up a.m. chemistry as well as phosphorus and magnesium Continue strict intake output  GASTROINTESTINAL A:   Some suggestion of pancreatitis on CT abd - lipase normal  Protein calorie malnutrition Abdominal pain with increased gastric residual Has profound leukocytosis as well as diarrhea, has also been on long term antibiotics would certainly be at risk for C. difficile P:   Holding tube feeds Repeat one view abdomen Continue PPI  HEMATOLOGIC A: Anemia - hgb continues to trend down.  No obvious s/s bleeding  Hemoglobin stable, no evidence of bleeding P:  Continue subcutaneous heparin CBC and am Transfuse per protocol   INFECTIOUS A: RLL CAP, URINE STREP + ?pancreatitis  Septic shock - remains pressor dependent. WBC trended up initially, down after widening antibiotic coverage.  Currently no positive culture data P:   Day 6 of cefepime and Vanco Added flagyl. Probably don't need Vanc  But given clinical decline will refrain from backing off for now.   ENDOCRINE  CBG (last 3)  A:  At risk hyperglycemia P:   Continue sliding scale insulin  NEUROLOGIC A: AMS - likely r/t respiratory failure/?hypercarbia and hypotension.  P:   Continue PAD protocol RASS goal of -3  FAMILY  - Updates: No family bedside  - Inter-disciplinary family meet or Palliative Care meeting due by:  Day 7. Current LOS is LOS 10 day   My critical care time 35 minutes Erick Colace ACNP-BC [place] Pager # [phone] OR # [phone] if no answer

## 2017-03-22 NOTE — Progress Notes (Signed)
When giving oral contrast down the NG tube patient began to vomit before finishing the first bottle, CT and MD aware, RN will continue to monitor.

## 2017-03-22 NOTE — Progress Notes (Signed)
Pharmacy Antibiotic Note - follow up  Kayla Tyler is a 71 y.o. female with a history of COPD admitted on 02/24/2017 with sepsis.Day#7 Vancomycin and Cefepime. Pt does have a PCN allergy listed, the reaction is passing out.   Patient afebrile, with brief episode of mild hypothermia within 24 hours, WBC trending down 28.7>>19.5, Scr fluctuating, 0.66>1. UOP +2L over the past two days. Cliinical worsening despite broad-spectrum antibiotics. Flagyl was added today for coverage of anaerobes due to suspected intra-abdominal infection, but diarrhea may be due to lactulose, which was discontinued.   Vancomycin trough has never been drawn. Blood cultures negatives.   Plan:  Continue Vancomycin 750 mg IV Q24  Continue Cefepime 1g IV Q24 Vancomycin trough scheduled for tomorrow Monitor clinic progress, WBC, TMax, renal function, electrolytes F/u BCx, C/S, future de-escalation, & length of therapy  Height: 5' (152.4 cm) Weight: 116 lb 13.5 oz (53 kg) IBW/kg (Calculated) : 45.5  Temp (24hrs), Avg:98.1 F (36.7 C), Min:97.5 F (36.4 C), Max:98.5 F (36.9 C)  Recent Labs  Lab 03/18/17 0335 03/19/17 0347 03/19/17 1157 03/20/17 0500 03/20/17 2041 03/21/17 0323 03/22/17 0432  WBC 23.4* 29.9*  --  30.3*  --  28.7* 19.5*  CREATININE 1.15* 0.96 0.92 0.81 0.75 0.66 1.00    Estimated Creatinine Clearance: 36.5 mL/min (by C-G formula based on SCr of 1 mg/dL).    Allergies  Allergen Reactions  . Lisinopril Swelling    Lip swelling (reaction to lisinopril/hctz)  . Penicillins Other (See Comments)    Pt passes out. Has patient had a PCN reaction causing immediate rash, facial/tongue/throat swelling, SOB or lightheadedness with hypotension: No Has patient had a PCN reaction causing severe rash involving mucus membranes or skin necrosis: No Has patient had a PCN reaction that required hospitalization yes Has patient had a PCN reaction occurring within the last 10 years: No If all of the above  answers are "NO", then may proceed with Cephalosporin use.    Antimicrobials this admission: Vancomycin 10/26 >> 10/28; 10/30>> Aztreonam 10/26 >> 10/28 Levofloxacin 10/26>>10/29 Ceftriaxone 10/29 >>10/30 Cefepime 10/30>> Flagyl 11/5>>  Microbiology results: 10/26 BCx: 1/2 Coag neg staph 10/26 UCx: negative 10/29 BCx: ngtd, final pending 10/30 UCx: 20,000 cfu/mL yeasts, catheterized  Nida Boatman, PharmD PGY1 Acute Care Pharmacy Resident Pager: [phone] 03/22/2017 12:05 PM   I discussed / reviewed the pharmacy note by Dr. Haydee Salter and I agree with the resident's findings and plans as documented.  Sloan Leiter, PharmD, BCPS, BCCCP Clinical Pharmacist Clinical phone 03/22/2017 until 3:30PM [phone] After hours, please call [phone]

## 2017-03-23 ENCOUNTER — Inpatient Hospital Stay (HOSPITAL_COMMUNITY): Payer: Medicare Other

## 2017-03-23 DIAGNOSIS — R579 Shock, unspecified: Secondary | ICD-10-CM

## 2017-03-23 DIAGNOSIS — J9601 Acute respiratory failure with hypoxia: Secondary | ICD-10-CM

## 2017-03-23 LAB — POCT I-STAT 3, ART BLOOD GAS (G3+)
ACID-BASE DEFICIT: 6 mmol/L — AB (ref 0.0–2.0)
BICARBONATE: 24 mmol/L (ref 20.0–28.0)
O2 SAT: 85 %
TCO2: 26 mmol/L (ref 22–32)
pCO2 arterial: 67 mmHg (ref 32.0–48.0)
pH, Arterial: 7.151 — CL (ref 7.350–7.450)
pO2, Arterial: 59 mmHg — ABNORMAL LOW (ref 83.0–108.0)

## 2017-03-23 LAB — ECHOCARDIOGRAM COMPLETE
CHL CUP TV REG PEAK VELOCITY: 294 cm/s
FS: 54 % — AB (ref 28–44)
HEIGHTINCHES: 60 in
IV/PV OW: 1.44
LA diam end sys: 30 mm
LADIAMINDEX: 1.94 cm/m2
LASIZE: 30 mm
LV PW d: 6.37 mm — AB (ref 0.6–1.1)
LVOT area: 2.01 cm2
LVOTD: 16 mm
TR max vel: 294 cm/s
WEIGHTICAEL: 1971.79 [oz_av]

## 2017-03-23 LAB — GLUCOSE, CAPILLARY
GLUCOSE-CAPILLARY: 80 mg/dL (ref 65–99)
GLUCOSE-CAPILLARY: 66 mg/dL (ref 65–99)
GLUCOSE-CAPILLARY: 52 mg/dL — AB (ref 65–99)
GLUCOSE-CAPILLARY: 64 mg/dL — AB (ref 65–99)
Glucose-Capillary: 138 mg/dL — ABNORMAL HIGH (ref 65–99)

## 2017-03-23 LAB — BASIC METABOLIC PANEL
ANION GAP: 11 (ref 5–15)
BUN: 40 mg/dL — ABNORMAL HIGH (ref 6–20)
CHLORIDE: 94 mmol/L — AB (ref 101–111)
CO2: 21 mmol/L — ABNORMAL LOW (ref 22–32)
Calcium: 7.9 mg/dL — ABNORMAL LOW (ref 8.9–10.3)
Creatinine, Ser: 1.47 mg/dL — ABNORMAL HIGH (ref 0.44–1.00)
GFR calc Af Amer: 40 mL/min — ABNORMAL LOW (ref >60)
GFR, EST NON AFRICAN AMERICAN: 34 mL/min — AB (ref >60)
GLUCOSE: 74 mg/dL (ref 65–99)
POTASSIUM: 5.1 mmol/L (ref 3.5–5.1)
Sodium: 126 mmol/L — ABNORMAL LOW (ref 135–145)

## 2017-03-23 LAB — CBC WITH DIFFERENTIAL/PLATELET
BASOS ABS: 0 10*3/uL (ref 0.0–0.1)
Basophils Relative: 0 %
Eosinophils Absolute: 0 10*3/uL (ref 0.0–0.7)
Eosinophils Relative: 0 %
HCT: 26.4 % — ABNORMAL LOW (ref 36.0–46.0)
HEMOGLOBIN: 9 g/dL — AB (ref 12.0–15.0)
LYMPHS PCT: 6 %
Lymphs Abs: 0.5 10*3/uL — ABNORMAL LOW (ref 0.7–4.0)
MCH: 33 pg (ref 26.0–34.0)
MCHC: 34.1 g/dL (ref 30.0–36.0)
MCV: 96.7 fL (ref 78.0–100.0)
MONOS PCT: 5 %
Monocytes Absolute: 0.4 10*3/uL (ref 0.1–1.0)
Neutro Abs: 7.9 10*3/uL — ABNORMAL HIGH (ref 1.7–7.7)
Neutrophils Relative %: 89 %
Platelets: 124 10*3/uL — ABNORMAL LOW (ref 150–400)
RBC: 2.73 MIL/uL — AB (ref 3.87–5.11)
RDW: 16.7 % — ABNORMAL HIGH (ref 11.5–15.5)
WBC MORPHOLOGY: INCREASED
WBC: 8.8 10*3/uL (ref 4.0–10.5)

## 2017-03-23 LAB — LACTIC ACID, PLASMA: Lactic Acid, Venous: 3.6 mmol/L (ref 0.5–1.9)

## 2017-03-23 LAB — PROCALCITONIN: Procalcitonin: 32.02 ng/mL

## 2017-03-23 MED ORDER — SODIUM BICARBONATE 8.4 % IV SOLN
INTRAVENOUS | Status: DC
  Administered 2017-03-23 (×2): via INTRAVENOUS
  Filled 2017-03-23 (×4): qty 850

## 2017-03-23 MED ORDER — DEXTROSE 50 % IV SOLN
INTRAVENOUS | Status: AC
  Filled 2017-03-23: qty 50

## 2017-03-23 MED ORDER — MORPHINE BOLUS VIA INFUSION
1.0000 mg | INTRAVENOUS | Status: DC | PRN
  Filled 2017-03-23: qty 10

## 2017-03-23 MED ORDER — SODIUM CHLORIDE 0.9 % IV SOLN
1.0000 mg/h | INTRAVENOUS | Status: DC
  Administered 2017-03-23: 2 mg/h via INTRAVENOUS
  Filled 2017-03-23: qty 10

## 2017-03-23 MED ORDER — DEXTROSE 5 % IV SOLN: 500.0000 mg | INTRAVENOUS | Status: DC

## 2017-03-23 MED ORDER — SODIUM BICARBONATE 8.4 % IV SOLN
50.0000 meq | Freq: Once | INTRAVENOUS | Status: AC
  Administered 2017-03-23: 50 meq via INTRAVENOUS
  Filled 2017-03-23: qty 50

## 2017-03-23 MED ORDER — DEXTROSE 50 % IV SOLN
25.0000 mL | Freq: Once | INTRAVENOUS | Status: AC
  Administered 2017-03-23: 25 mL via INTRAVENOUS

## 2017-03-23 MED ORDER — IOPAMIDOL (ISOVUE-300) INJECTION 61%
INTRAVENOUS | Status: AC
Start: 2017-03-23 — End: 2017-03-23
  Administered 2017-03-23: 100 mL
  Filled 2017-03-23: qty 100

## 2017-03-24 LAB — CULTURE, RESPIRATORY W GRAM STAIN

## 2017-03-24 LAB — CULTURE, RESPIRATORY

## 2017-03-25 ENCOUNTER — Telehealth: Payer: Self-pay

## 2017-03-25 NOTE — Telephone Encounter (Signed)
On 03/25/17 I received a d/c from [facility] (original). The d/c is for burial. The patient is a patient of Educational [facility].  The d/c will be taken to [facility] ([address]) this am for signature.  On 03/26/17 I received the d/c back from [place]. I got the d/c ready and called the funeral home to let them know the d/c is ready for pickup.

## 2017-04-02 DIAGNOSIS — J449 Chronic obstructive pulmonary disease, unspecified: Secondary | ICD-10-CM | POA: Diagnosis not present

## 2017-04-17 NOTE — Progress Notes (Signed)
RT took patient off of vent per withdrawal of life sustaining measure order at [employer]. Patient family is at bedside.

## 2017-04-17 NOTE — Plan of Care (Signed)
  Interdisciplinary Goals of Care Family Meeting   Date carried out:: 03-25-2017  Location of the meeting: Bedside  Member's involved: Physician, Bedside Registered Nurse and Family Member or next of kin  Durable Power of Attorney or acting medical decision maker:   Patient's Daughters.  Discussion: We discussed goals of care for [facility].  Discussed patient's continued clinical deterioration with the patient's daughter and other family members at bedside. Explained that she is continuing to deteriorate. Family want [place] to try to maintain her until 6pm when additional family can arrive to transition to full comfort. Explained the process of using morphine for palliation of pain & dyspnea along with the discontinuation of her vasopressor infusion & ventilator support. Daughter & family members in agreement. Offered hospital chaplain for spiritual support. Family will have unrestricted visitation from this point on. I've ordered a morphine infusion for her comfort at this point.   Code status: Full DNR  Disposition: Continue current acute care  Time spent for the meeting: 7 minutes   Tera Partridge March 25, 2017, 4:21 PM

## 2017-04-17 NOTE — Progress Notes (Signed)
  Echocardiogram 2D Echocardiogram has been performed.  Kayla Tyler 2017/04/05, 8:59 AM

## 2017-04-17 NOTE — Progress Notes (Signed)
Morphine gtt 232ml wasted witnessed by Jabier Gauss, RN

## 2017-04-17 NOTE — Progress Notes (Addendum)
Silver ring was left at bedside, family not available.   Family called regarding ring left at sink, pt's family states she is on her way to get her ring.

## 2017-04-17 NOTE — Progress Notes (Signed)
Hypoglycemic Event  CBG: 64  Treatment: D50 IV 25 mL  Symptoms: None  Follow-up CBG: Time:1225 CBG Result:138  Possible Reasons for Event: Unknown  Comments/MD notified:Yes    Kayla Tyler L

## 2017-04-17 NOTE — Progress Notes (Signed)
At [place] patient was transitioned to comfort care with family and chaplain at bedside.  Morphine drip started, pressors stopped and patient removed from ventilator.  At 1838 patient went asystole with no heart or breath sounds ausculted and pronounced by two RN's Damita Dunnings and [employer].  CDS notified, lines and tubes removed per protocol and post mortem check list completed.

## 2017-04-17 NOTE — Death Summary Note (Signed)
Death Note: For a complete accounting of the patient's history and physical exam on presentation please refer to the H&P dictated on 02/16/2017. Patient was a 71 year old female. She presented with altered mentation with family present. It was reported that she had decreased oral intake for at least a few days prior to presenting. The day of admission she was more confused and upon EMS arrival she was found to be hypothermic, bradycardic, and hypotensive. Upon arrival in the emergency department the patient was intubated for airway protection with her altered mentation. Her encephalopathy was thought to be secondary to septic shock and her hyponatremia. Patient was also noted to be hypoxic. Fluid resuscitation was initiated for shock and empiric Plavix were started for presumed sepsis. Imaging of the chest revealed a dense right lower lobe consolidation with adjacent small pleural effusion and fluid within the esophagus concerning for esophageal dysmotility. During the course of her admission she self extubated and was subsequently reintubated emergently. With persistent respiratory failure she underwent percutaneous tracheostomy placement. On 11/5 she was noted to have worsening acidosis and abdominal distention. Abdominal CT imaging was subsequently performed showing dilated small bowel and colon dilation proximal to the transverse colon with extensive pneumatosis involving all dilated segments of bowel. There was also some portal venous gas and findings were worrisome for ischemic bowel. 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 As the patient's clinical status worsened and she required increasing levels of vasopressor support. Multiple family discussions were held updating the patient's daughters as well as other family members at bedside and via phone. Ultimately as the patient continued to deteriorate  clinically family decided to transition to full comfort care. Morphine drip was initiated for patient's comfort with relief of pain and dyspnea. At 6:38 PM on 04/15/17 the patient passed with multiple family members at bedside.  Diagnoses at Death: 1. Septic shock 2. Bowel ischemia 3. Acute renal failure 4. Acute hypoxic respiratory failure 5. Thrombocytopenia 6. Hyponatremia 7. Streptococcus pneumoniae pneumonia 8. History of COPD 9. History of breast cancer 10. History of essential hypertension 11. History of tobacco use disorder

## 2017-04-17 NOTE — Progress Notes (Signed)
Pharmacy Antibiotic Note  Kayla Tyler is a 71 y.o. female admitted on 03/02/2017 with concern for intra-abdominal and MRSE bacteremia.  Pharmacy has been consulted for Vancomycin and Cefepime dosing.  Plan: Hold Vancomycin with currently no UOP.  Check random vancomycin level in AM if continue. Reduce Cefepime to 500mg  IV every 24 hours.  Monitor plan of care, renal function, clinical status.   Height: 5' (152.4 cm) Weight: 123 lb 3.8 oz (55.9 kg) IBW/kg (Calculated) : 45.5  Temp (24hrs), Avg:96.6 F (35.9 C), Min:95.4 F (35.2 C), Max:97.6 F (36.4 C)  Recent Labs  Lab 03/19/17 0347  03/20/17 0500 03/20/17 2041 03/21/17 0323 03/22/17 0432 03/22/17 0915 2017-04-01 0341  WBC 29.9*  --  30.3*  --  28.7* 19.5*  --  8.8  CREATININE 0.96   < > 0.81 0.75 0.66 1.00  --  1.47*  LATICACIDVEN  --   --   --   --   --   --  1.6 3.6*   < > = values in this interval not displayed.    Estimated Creatinine Clearance: 27.1 mL/min (A) (by C-G formula based on SCr of 1.47 mg/dL (H)).    Allergies  Allergen Reactions  . Lisinopril Swelling    Lip swelling (reaction to lisinopril/hctz)  . Penicillins Other (See Comments)    Pt passes out. Has patient had a PCN reaction causing immediate rash, facial/tongue/throat swelling, SOB or lightheadedness with hypotension: No Has patient had a PCN reaction causing severe rash involving mucus membranes or skin necrosis: No Has patient had a PCN reaction that required hospitalization yes Has patient had a PCN reaction occurring within the last 10 years: No If all of the above answers are "NO", then may proceed with Cephalosporin use.    Antimicrobials this admission: CTX 10/29 >> 10/30 Cefepime 10/30 >> Vanc 10/30 >> Flagyl 11/5 >> LVQ 10/26 >> 10/29 Azactam 10/26 >> 10/28  Dose adjustments this admission:   Microbiology results: 10/26 BCx - 1/2 CoNS (BCID Staph spp) 10/26 UCx - negative 10/27 resp panel PCR - negative 10/27 Strep pneumo  urinary Ag - positive 10/30 BCx - ngtd 10/30 UCx - 20k yeast (preliminary) 10/30 TA - few yeast 11/5 Cdiff - negative 11/5 Resp >>  Thank you for allowing pharmacy to be a part of this patient's care.  Sloan Leiter, PharmD, BCPS, BCCCP Clinical Pharmacist Clinical phone 2017/04/01 until 3:30PM- #[phone] After hours, please call [phone] 04-01-2017 3:44 PM

## 2017-04-17 NOTE — Progress Notes (Signed)
   03/29/17 1900  Clinical Encounter Type  Visited With Patient and family together  Visit Type Death  Referral From Nurse  Consult/Referral To Chaplain  Spiritual Encounters  Spiritual Needs Emotional;Prayer;Ritual  Stress Factors  Patient Stress Factors None identified  Family Stress Factors Major life changes;Family relationships;Exhausted  Chaplain responded to code.  Family PT was being moved to comfort care and taken off of vent.  Chaplain provided care to the family and prayer for the PT.  Family was familiar with chaplain from previous hospital visit.  Family wanted scriptures read and prayer just as PT was passing.

## 2017-04-17 NOTE — Procedures (Signed)
Arterial Catheter Insertion Procedure Note Kayla Tyler [id number] [birth year]  Procedure: Insertion of Arterial Catheter  Indications: Blood pressure monitoring  Procedure Details Consent: Unable to obtain consent because of emergent medical necessity. Time Out: Verified patient identification, verified procedure, site/side was marked, verified correct patient position, special equipment/implants available, medications/allergies/relevent history reviewed, required imaging and test results available.  Performed  Maximum sterile technique was used including antiseptics, cap, gloves, gown, hand hygiene, mask and sheet. Skin prep: Chlorhexidine; local anesthetic administered 20 gauge catheter was inserted into left radial artery using the Seldinger technique.  Evaluation Blood flow good; BP tracing good. Complications: No apparent complications.  Ultrasound guidance used in placement.   Georgann Housekeeper, AGACNP-BC [facility] Pulmonology/Critical Care Pager [phone] or [phone]  04-02-17 3:44 AM

## 2017-04-17 NOTE — Progress Notes (Signed)
PULMONARY / CRITICAL CARE MEDICINE   Name: AVERYANA PILLARS MRN: [id number] DOB: 12-Apr-1946 PCP Tawny Asal, MD LOS 11 as of 2017-04-21  ADMISSION DATE:  02/22/2017 CONSULTATION DATE:  04-21-2017  REFERRING MD:  ER  CHIEF COMPLAINT:  Encephalopathy, vdrf, sepsis  BRIEF 71 yo-F presented to the ED 10/26 because of altered mental status. Patient is poor historian, so the hx was taken from the records and her daughter. Patient had decreased oral intake for the lasy few days, however she was alert and interactive. But on the day of admission was confused, EMS was called and found the patient to be hypothermic, bradycardiac and hypotensive. Patient intubated in the ED for airway protection.   EVENT 03/13/2017 - intubated. On levophed 19mcg, vaso 0.03 and neo at 136mcg. 60% fio2 on =vent. Daughter at bedside - reports daily EtOH and cig. Admits to COPD hx. Reports that she annually gets flu/pna this time of the year 03/14/17 - BCID staph species detected - coag neg, MRSA PCR negative. URINE STREP ++ , RVP NEGATIVe.  Lowering pressor needs. Off neo. AKI worse but RN says making urine   Subjective Clinically declined over the a.m. hours now full DO NOT RESUSCITATE CT suggesting ischemic gut  VITAL SIGNS: BP 94/61   Pulse 100   Temp (!) 95.5 F (35.3 C)   Resp 16   Ht 5' (1.524 m)   Wt 123 lb 3.8 oz (55.9 kg)   SpO2 97%   BMI 24.07 kg/m   HEMODYNAMICS: CVP:  [0 mmHg-9 mmHg] 0 mmHg  VENTILATOR SETTINGS: Vent Mode: PRVC FiO2 (%):  [40 %-60 %] 60 % Set Rate:  [16 bmp] 16 bmp Vt Set:  [400 mL] 400 mL PEEP:  [5 cmH20] 5 cmH20 Plateau Pressure:  [24 cmH20-32 cmH20] 32 cmH20  INTAKE / OUTPUT: I/O last 3 completed shifts: In: 5323.8 [P.O.:800; I.V.:2653.8; NG/GT:220; IV Piggyback:1650] Out: 35 [Urine:295; Emesis/NG output:311; Stool:25]  EXAM General: 71 year old female currently critically ill and actively dying HEENT: Normocephalic atraumatic #6 tracheostomy unremarkable mucous  membranes are dry Pulmonary: Clear to auscultation some accessory muscle use, tachypnea. Cardiac: Regular rate and rhythm no murmur rub or gallop Abdomen: Distended, hypoactive, winces to palpation, no organomegaly Extremities/musculoskeletal: Cool, diffuse anasarca, weak pulses Neuro: Heavily sedated  LABS  PULMONARY Recent Labs  Lab 03/18/17 0403 03/19/17 0501 03/22/17 0604 03/22/17 0658 03/22/17 1042 04/21/17 0325  PHART 7.297* 7.457* 7.031* 7.137* 7.113* 7.151*  PCO2ART 36.8 29.3* 84.8* 66.4* 61.7* 67.0*  PO2ART 83.0 117* 55.0* 179.0* 102.0 59.0*  HCO3 17.9* 20.4 22.5 22.5 19.8* 24.0  TCO2 19*  --  25 24 22 26   O2SAT 95.0 98.4 71.0 99.0 95.0 85.0   CBC Recent Labs  Lab 03/21/17 0323 03/22/17 0432 04/21/2017 0341  HGB 9.3* 9.1* 9.0*  HCT 27.2* 27.1* 26.4*  WBC 28.7* 19.5* 8.8  PLT 120* 141* 124*   COAGULATION Recent Labs  Lab 03/19/17 0900  INR 1.35   CARDIAC No results for input(s): TROPONINI in the last 168 hours. No results for input(s): PROBNP in the last 168 hours.  CHEMISTRY Recent Labs  Lab 03/18/17 0335 03/19/17 0347  03/20/17 0500 03/20/17 2041 03/21/17 0323 03/22/17 0432 April 21, 2017 0341  NA 121* 124*   < > 127* 123* 129* 126* 126*  K 3.4* 2.3*   < > 2.9* 4.3 3.8 3.8 5.1  CL 94* 92*   < > 94* 92* 96* 95* 94*  CO2 17* 20*   < > 21* 22 24 23  21*  GLUCOSE 120* 144*   < > 138* 126* 115* 267* 74  BUN 32* 32*   < > 29* 27* 25* 32* 40*  CREATININE 1.15* 0.96   < > 0.81 0.75 0.66 1.00 1.47*  CALCIUM 6.8* 7.5*   < > 7.5* 7.0* 7.6* 7.6* 7.9*  MG 1.5* 1.7  --  1.8  --  2.0 1.9  --   PHOS 2.6 3.7  --  2.6  --  4.4 4.9*  --    < > = values in this interval not displayed.   Estimated Creatinine Clearance: 27.1 mL/min (A) (by C-G formula based on SCr of 1.47 mg/dL (H)).  I/O last 3 completed shifts: In: 5323.8 [P.O.:800; I.V.:2653.8; NG/GT:220; IV Piggyback:1650] Out: 631 [Urine:295; Emesis/NG output:311; Stool:25]  Intake/Output Summary (Last 24  hours) at 04/05/17 0844 Last data filed at 05-Apr-2017 0600 Gross per 24 hour  Intake 3484.35 ml  Output 16 ml  Net 3468.35 ml   LIVER Recent Labs  Lab 03/17/17 0900 03/19/17 0900 03/22/17 0432  AST 66*  --   --   ALT 55*  --   --   ALKPHOS 109  --   --   BILITOT 0.9  --   --   PROT 5.4*  --   --   ALBUMIN 3.3*  --  1.7*  INR  --  1.35  --    INFECTIOUS Recent Labs  Lab 03/17/17 0348 03/22/17 0830 03/22/17 0915 04-05-2017 0341  LATICACIDVEN  --   --  1.6 3.6*  PROCALCITON 1.94 12.01  --  32.02    ENDOCRINE CBG (last 3)  Recent Labs    03/22/17 2350 2017-04-05 0322 04-05-2017 0817  GLUCAP 79 80 66   IMAGING x48h  - image(s) personally visualized  -   highlighted in bold Ct Abdomen Pelvis W Contrast  Result Date: April 05, 2017 CLINICAL DATA:  Abdominal pain possible colitis EXAM: CT ABDOMEN AND PELVIS WITH CONTRAST TECHNIQUE: Multidetector CT imaging of the abdomen and pelvis was performed using the standard protocol following bolus administration of intravenous contrast. CONTRAST:  127mL ISOVUE-300 IOPAMIDOL (ISOVUE-300) INJECTION 61% COMPARISON:  Radiograph 03/22/2017, 03/21/2017, CT 03/13/2017 FINDINGS: Lower chest: Moderate bilateral pleural effusions. Borderline cardiomegaly. Coronary calcifications. No significant pericardial effusion. Small small to moderate hiatal hernia. Passive atelectasis within the bilateral lower lobes. Hepatobiliary: Nodular liver contour consistent with cirrhosis. Surgical clips in the gallbladder fossa. No biliary dilatation. Focal wedge-shaped hypodensity within the inferior right hepatic lobe, series 3, image number 32. Pancreas: Unremarkable. No pancreatic ductal dilatation or surrounding inflammatory changes. Spleen: Heterogeneous enhancement. Adrenals/Urinary Tract: Adrenal glands are within normal limits. Multifocal left greater than right somewhat wedged shaped, peripheral cortical hypodensity. Cyst in the inferior pole of the left kidney. No  hydronephrosis. Foley catheter is present within the bladder which is empty. Stomach/Bowel: Esophageal tube tip terminates within the mid stomach. Multiple loops of dilated distal small bowel and terminal ileum, measuring up to 3.4 cm. Extensive pneumatosis involving the dilated small bowel loops. Markedly enlarged cecum which demonstrates transverse lie. Extensive pneumatosis involving the cecum, right colon and portions of the transverse colon. The left colon and rectosigmoid colon are decompressed. Vascular/Lymphatic: Extensive atherosclerotic calcifications of the aorta and branch vessels. 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 Moderate severe stenosis of the SMA just past its origin with diffuse attenuation of the vasculature but no abrupt occlusion allowing for non CTA images. No significantly enlarged abdominal or pelvic lymph  nodes. Small focus of gas within the left portal vein. Reproductive: Previously suggested right ovarian lesion is largely obscured by ascites. Other: Anasarca. No definite free air is seen. There is moderate ascites within the abdomen and pelvis. Musculoskeletal: Re- demonstrated superior endplate compression fracture at L2. IMPRESSION: 1. Dilated distal small bowel, cecum, ascending colon, and proximal transverse colon with extensive pneumatosis involving all dilated segments of bowel. Small focus of portal venous gas. The collective findings are worrisome for ischemic bowel. No free air at this time. Extensive atherosclerotic vascular disease of the aorta with suspected stenosis involving both the celiac and SMA vessels. 2. Focal wedge-shaped hypodensity in the inferior right hepatic lobe suspicious for infarct. Multifocal peripheral wedge-shaped hypodensities within the left greater than right kidney also suspicious for infarcts. 3. Moderate ascites in the abdomen and pelvis. Cirrhotic appearing liver. 4. Moderate  bilateral pleural effusions with partial lower lobe consolidations. 5. Anasarca Critical Value/emergent results were called by telephone at the time of interpretation on April 13, 2017 at 2:23 am to Dr. Madalyn Rob, who verbally acknowledged these results. Electronically Signed   By: Donavan Foil M.D.   On: 2017-04-13 02:23   Dg Chest Port 1 View  Result Date: 03/22/2017 CLINICAL DATA:  Respiratory distress.  Hypoxia. EXAM: PORTABLE CHEST 1 VIEW COMPARISON:  03/20/2017 FINDINGS: Tracheostomy. Endotracheal tube tip is off the field of view but below the left hemidiaphragm. Normal heart size and pulmonary vascularity. Patchy perihilar infiltrates similar to previous study. This may represent pneumonia or edema. No blunting of costophrenic angles. No pneumothorax. Calcification and torsion of the aorta. Right central venous catheter with tip over the cavoatrial junction region. IMPRESSION: Patchy perihilar infiltrates in both lungs similar to previous study. This may represent pneumonia or edema. Appliances appear in satisfactory position. Electronically Signed   By: Lucienne Capers M.D.   On: 03/22/2017 05:34   Dg Abd Portable 1v  Result Date: 03/22/2017 CLINICAL DATA:  Abdominal pain EXAM: PORTABLE ABDOMEN - 1 VIEW COMPARISON:  March 21, 2017 FINDINGS: Nasogastric tube tip and side port in stomach. There is extensive stool throughout portions of the descending and sigmoid colon, essentially stable. There are multiple loops of dilated bowel. No air-fluid levels. No free air evident. There is, however, air concerning for pneumatosis in the sigmoid colon region on the right. There are probable phleboliths in the pelvis. IMPRESSION: Nasogastric tube tip and side port in stomach. Loops of small and large bowel dilatation for cyst. Question ileus versus a degree of distal bowel obstruction. The greatest degree of dilatation comparatively is in the sigmoid colon which is somewhat distended with stool. There is question  of pneumatosis in the wall of the sigmoid colon. This finding raises concern for a possible degree of underlying bowel ischemia. In this regard, correlation with abdominal and pelvic CT angiogram may be reasonable. Electronically Signed   By: Lowella Grip III M.D.   On: 03/22/2017 10:18   Dg Abd Portable 1v  Result Date: 03/21/2017 CLINICAL DATA:  Initial evaluation for acute abdominal pain. EXAM: PORTABLE ABDOMEN - 1 VIEW COMPARISON:  Prior radiograph from 03/19/2017. FINDINGS: Enteric tube in place with tip in side hole overlying the stomach, well beyond the GE junction. Tip projects inferiorly. Few scattered gas-filled loops of small bowel seen throughout the abdomen. Prominent heterogeneous structure overlying the lower mid abdomen may reflect a large stool filled sigmoid colon. This is increased in size from previous. Correlation for constipation recommended. No soft tissue mass or abnormal calcification. Cholecystectomy clips noted.  Foley catheter overlies the lower mid pelvis. Prominent aorto bi-iliac atherosclerotic disease noted. Streaky atelectatic changes noted at the right lung base. IMPRESSION: 1. Tip and side hole of enteric tube overlying the stomach, well beyond the GE junction. Tip projects inferiorly. 2. Question prominent stool within a dilated sigmoid colon in the lower mid abdomen. Correlation for constipation recommended. Further assessment with dedicated cross-sectional imaging could be performed as warranted. Electronically Signed   By: Jeannine Boga M.D.   On: 03/21/2017 16:20   ABX:  Vanc 10/26>>>10/27>>>10/30>>> Aztreonam 10/26>>>10/27 levquin 10/27>>>10/29 Rocephin 10/29>>> 10/30 Cefepime 10/30>>> Vancomycin 10/30>>>  MICRO:  RVP 10/28>>> NEG  BC x 2 10/26>>> 1/2 coag neg staph Urine strep 10/27>>> POS   LINES/Tubes:  ETT 10/26>>>10/31>>>10/31>>> R IJ CVL 10/26>>> Tracheostomy placed on 03/19/2018>>  STUDIES:  CT chest/abd/pelvis 10/27>>> 1. Dense right  lower lobe consolidation, and additional patchy airspace opacities within the right upper and middle lobes, compatible with multifocal pneumonia. Would correlate clinically for evidence of aspiration. 2. Small right pleural effusion noted. 3. Vague soft tissue edema about the head and body of the pancreas is concerning for pancreatitis. This is not well assessed without contrast. Underlying mesenteric edema may reflect the suspected pancreatic process. 4. Distention of the esophagus with fluid, concerning for significant esophageal dysmotility. Enteric tube noted ending at the body of the stomach. 5. 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 Recommend annual imaging followup by CTA or MRA. 6. Diffuse coronary artery calcifications seen. 7. Scattered diverticulosis along the sigmoid colon, without evidence of diverticulitis. 8. Diffuse aortic atherosclerosis. 9. Apparent 3.7 cm right adnexal dermoid cyst noted, mildly increased in size from 2006. 10. 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 11. Soft tissue edema at the right flank. Echo 11/5>>.  Canceled CT abdomen and pelvis 11/5: Dilated distal small bowel cecum ascending colon and proximal transverse colon with extensive pneumatosis involving all dilated segments of bowel.  ASSESSMENT / PLAN:  Acute respiratory failure  Dense RLL PNA -- CAP +/- Aspiration PNA  Hx COPD; now with acute exacerbation Septic shock/MODS AKI, creatinine normalized Chronic hyponatremia  Hypophosphatemia Ischemic colitis Anemia - hgb continues to trend down.  RLL CAP, URINE STREP + Acute encephalopathy   Discussion Is a 71 year old African-American female who was initially admitted for what is likely pneumococcal pneumonia and resultant respiratory failure and subsequent septic shock.  She has had a prolonged critical illness ultimately requiring tracheostomy placement in effort to  come off the mechanical ventilator.  Unfortunately on 11/4 she began to develop worsening abdominal discomfort, increased gastric residuals, and hemodynamic decline.  Over the following 24-48 hours she had worsening ventilator mechanics felt secondary to decreased abdominal compliance plus minus component of airflow limitation.  We obtained a flat plate of abdomen demonstrating findings that were worrisome for bowel ischemia.  We obtained a CT of the abdomen demonstrated what appears to be diffuse ischemic gut.  She has had persistent decline over the following 24 hours and is now on multiple pressors and a bicarbonate infusion.  She is actively dying.  Family was called to bedside during the early a.m. oh hours, agreed to DO NOT RESUSCITATE, currently maintaining supportive care anticipate withdrawal later today.  Plan Continue current vasoactive drips Continue bicarbonate infusion Continue active comfort measures Discontinue further labs Transition to full comfort when family arrives and is ready Full DO NOT RESUSCITATE  Erick Colace ACNP-BC [place] Pager # [phone] OR #  [phone] if no answer

## 2017-04-17 NOTE — Progress Notes (Signed)
Wasted 275ml of Fentanyl in sink.  Witnessed by Silva Bandy, Therapist, sports.

## 2017-04-17 NOTE — Progress Notes (Signed)
PCCM INTERVAL PROGRESS NOTE   Made aware of CT results by [facility] MD as outlined below.    Dilated distal small bowel, cecum, ascending colon, and proximal transverse colon with extensive pneumatosis involving all dilated segments of bowel. Small focus of portal venous gas. The collective findings are worrisome for ischemic bowel. No free air at this time. Extensive atherosclerotic vascular disease of the aorta with suspected stenosis involving both the celiac and SMA vessels. Focal wedge-shaped hypodensity in the inferior right hepatic lobe suspicious for infarct. Multifocal peripheral wedge-shaped hypodensities within the left greater than right kidney also suspicious for infarcts.  Patient remains in refractory shock and acidosis.   I feel as though she is a poor operative candidate from a critical care perspective. I have contacted family to make them aware of this new information. Sherri (Daughter) is aware that her mother has taken a "turn for the worst" and is unlikely to survive this constellation of critical illnesses. We discussed goal of care, and she is not willing to make any decisions regarding GOC at this time. I have advised her to bring her sisters here as soon a possible to discuss further.   Plan: Continue aggressive care Continue levophed for MAP > 86mmHg Give bicarb amp and start infusion Draw AM labs now including add on orders for lactic, abg Full code  Georgann Housekeeper, AGACNP-BC [place] Pulmonology/Critical Care Pager [phone] or [phone]  04/17/17 3:23 AM

## 2017-04-17 NOTE — Progress Notes (Signed)
PCCM INTERVAL PROGRESS NOTE  Two daughters now here at bedside. Understand severity of illness and exceedingly poor prognosis. They agree DNR is appropriate at this time.  Once her sisters arrive in the morning they will elect to transition to comfort care. Will continue pressors/Bicarb/vent until they are ready.   Georgann Housekeeper, AGACNP-BC [facility] Pulmonology/Critical Care Pager [phone] or [phone]  04/17/2017 4:01 AM

## 2017-04-17 DEATH — deceased

## 2019-07-18 IMAGING — DX DG CHEST 1V PORT
1 series · 1 of 1 positions shown · non-contrast
Comparison: March 19, 2017

CLINICAL DATA: Respiratory failure

EXAM:
PORTABLE CHEST 1 VIEW

[chest]
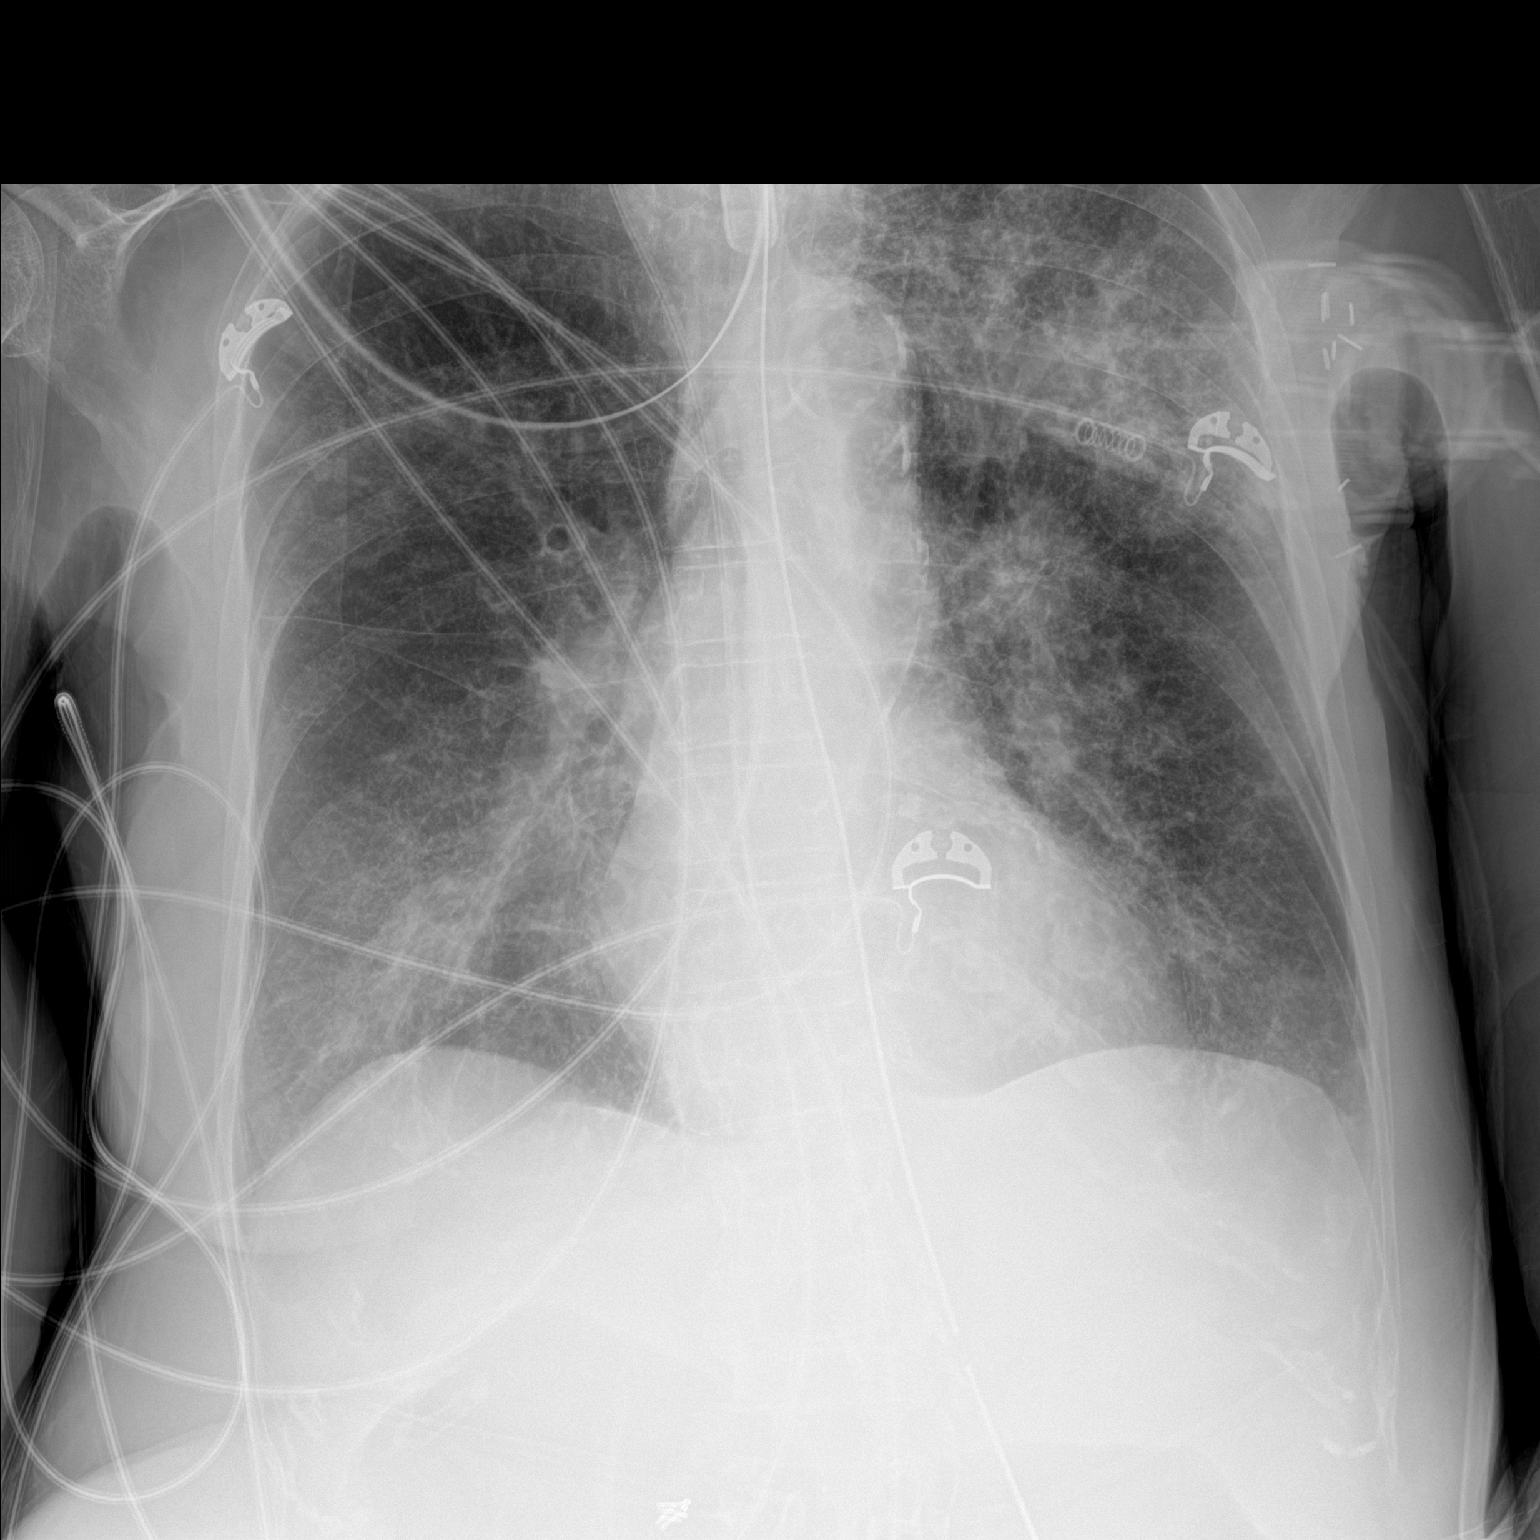

[1 of 1 positions shown; findings below may reference images not displayed]

FINDINGS: The support apparatus is stable within visualize limits. No
pneumothorax. Focal infiltrate in the left apex is stable. Focal
infiltrate in the right base is stable. Mild infiltrate in the right
upper lobe has improved. Interstitial prominence without overt
edema. The cardiomediastinal silhouette is stable.
IMPRESSION: 1. Patchy bilateral infiltrate is similar in the left upper lobe and
right lower lobe but improved in the right upper lobe. Recommend
follow-up to complete resolution. No other change.

## 2019-07-20 IMAGING — DX DG ABD PORTABLE 1V
1 series · 2 of 2 positions shown · non-contrast
Comparison: March 21, 2017

CLINICAL DATA: Abdominal pain

EXAM:
PORTABLE ABDOMEN - 1 VIEW

[Series 1: abdomen · 0.14mm/px · 2 of 2 slices shown]
[im 1/2]
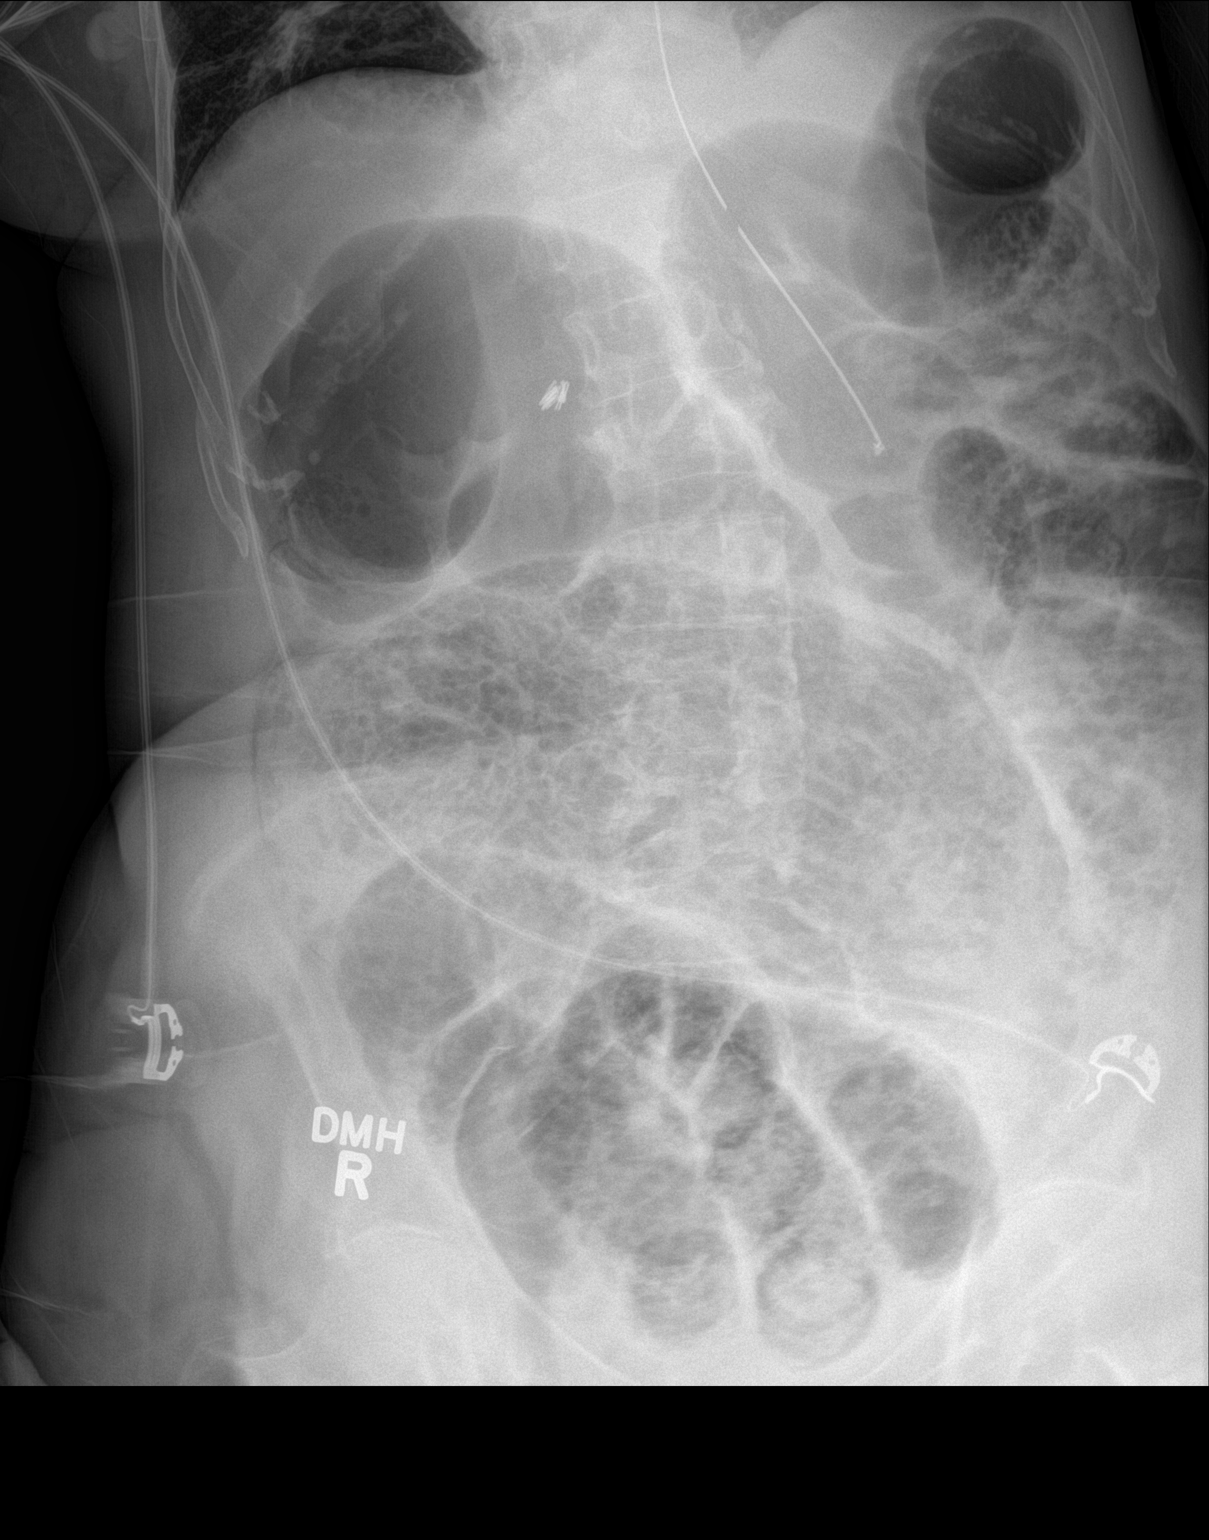
[im 2/2]
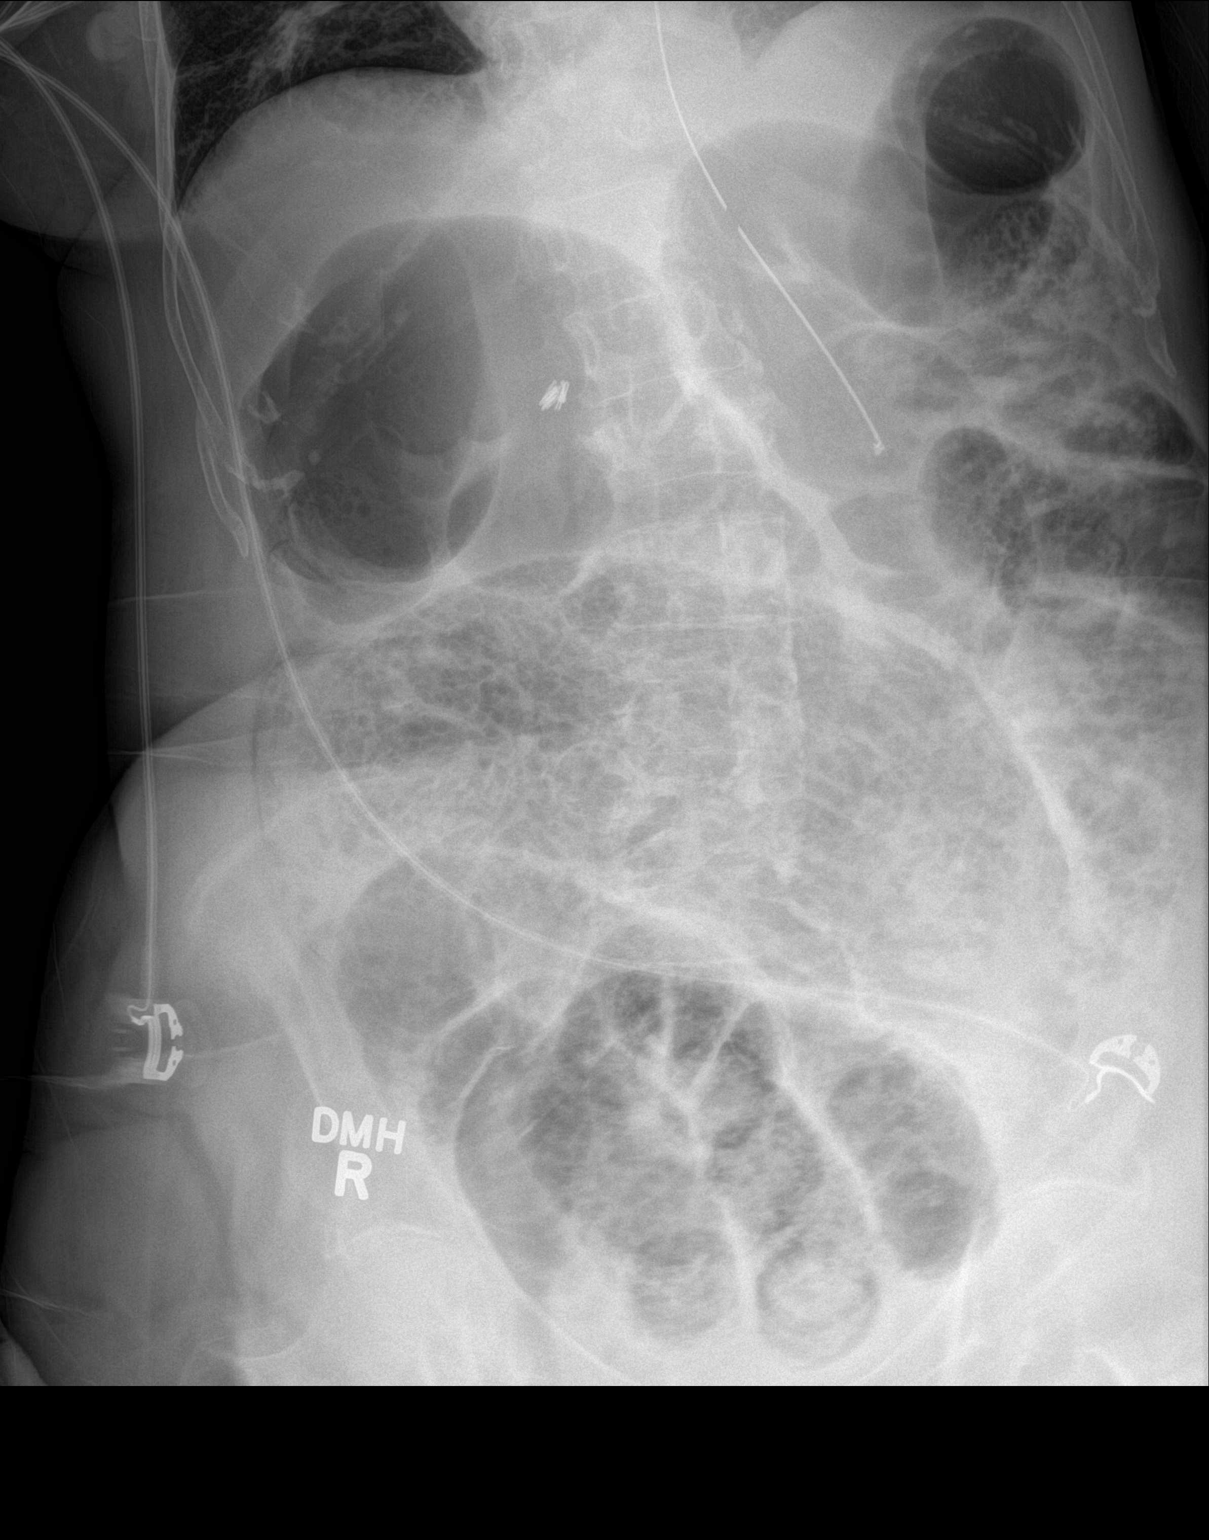

[2 of 2 positions shown; findings below may reference images not displayed]

FINDINGS: Nasogastric tube tip and side port in stomach. There is extensive
stool throughout portions of the descending and sigmoid colon,
essentially stable. There are multiple loops of dilated bowel. No
air-fluid levels. No free air evident. There is, however, air
concerning for pneumatosis in the sigmoid colon region on the right.
There are probable phleboliths in the pelvis.
IMPRESSION: Nasogastric tube tip and side port in stomach. Loops of small and
large bowel dilatation for cyst. Question ileus versus a degree of
distal bowel obstruction. The greatest degree of dilatation
comparatively is in the sigmoid colon which is somewhat distended
with stool. There is question of pneumatosis in the wall of the
sigmoid colon. This finding raises concern for a possible degree of
underlying bowel ischemia. In this regard, correlation with
abdominal and pelvic CT angiogram may be reasonable.
